# Patient Record
Sex: Female | Born: 1945 | Race: White | Hispanic: No | Smoking: Never smoker
Health system: Southern US, Community
[De-identification: ages and names within clinical notes are randomized; demographics above are authoritative.]

## PROBLEM LIST (undated history)

## (undated) DIAGNOSIS — I639 Cerebral infarction, unspecified: Secondary | ICD-10-CM

## (undated) DIAGNOSIS — M1990 Unspecified osteoarthritis, unspecified site: Secondary | ICD-10-CM

## (undated) DIAGNOSIS — R011 Cardiac murmur, unspecified: Secondary | ICD-10-CM

## (undated) DIAGNOSIS — K6389 Other specified diseases of intestine: Secondary | ICD-10-CM

## (undated) DIAGNOSIS — G459 Transient cerebral ischemic attack, unspecified: Secondary | ICD-10-CM

## (undated) DIAGNOSIS — I10 Essential (primary) hypertension: Secondary | ICD-10-CM

## (undated) DIAGNOSIS — E7800 Pure hypercholesterolemia, unspecified: Secondary | ICD-10-CM

## (undated) DIAGNOSIS — I6529 Occlusion and stenosis of unspecified carotid artery: Secondary | ICD-10-CM

## (undated) DIAGNOSIS — K219 Gastro-esophageal reflux disease without esophagitis: Secondary | ICD-10-CM

## (undated) HISTORY — PX: ABDOMINAL HYSTERECTOMY: SHX81

## (undated) HISTORY — DX: Gastro-esophageal reflux disease without esophagitis: K21.9

## (undated) HISTORY — PX: TUBAL LIGATION: SHX77

## (undated) HISTORY — DX: Pure hypercholesterolemia, unspecified: E78.00

## (undated) HISTORY — PX: KNEE ARTHROSCOPY: SUR90

## (undated) HISTORY — PX: OTHER SURGICAL HISTORY: SHX169

## (undated) HISTORY — DX: Occlusion and stenosis of unspecified carotid artery: I65.29

## (undated) HISTORY — PX: FRACTURE SURGERY: SHX138

## (undated) HISTORY — DX: Essential (primary) hypertension: I10

## (undated) HISTORY — PX: TONSILLECTOMY: SUR1361

## (undated) HISTORY — PX: ROTATOR CUFF REPAIR: SHX139

## (undated) HISTORY — PX: CATARACT EXTRACTION W/ INTRAOCULAR LENS IMPLANT: SHX1309

## (undated) HISTORY — DX: Other specified diseases of intestine: K63.89

---

## 2000-09-24 ENCOUNTER — Other Ambulatory Visit: Admission: RE | Admit: 2000-09-24 | Discharge: 2000-09-24 | Payer: Self-pay | Admitting: *Deleted

## 2001-03-16 ENCOUNTER — Encounter: Payer: Self-pay | Admitting: Family Medicine

## 2001-03-16 ENCOUNTER — Ambulatory Visit (HOSPITAL_COMMUNITY): Admission: RE | Admit: 2001-03-16 | Discharge: 2001-03-16 | Payer: Self-pay | Admitting: Nephrology

## 2001-03-17 ENCOUNTER — Ambulatory Visit (HOSPITAL_COMMUNITY): Admission: RE | Admit: 2001-03-17 | Discharge: 2001-03-17 | Payer: Self-pay | Admitting: General Surgery

## 2001-08-27 ENCOUNTER — Ambulatory Visit (HOSPITAL_COMMUNITY): Admission: RE | Admit: 2001-08-27 | Discharge: 2001-08-27 | Payer: Self-pay | Admitting: *Deleted

## 2001-08-27 ENCOUNTER — Encounter: Payer: Self-pay | Admitting: *Deleted

## 2001-11-16 ENCOUNTER — Ambulatory Visit (HOSPITAL_COMMUNITY): Admission: RE | Admit: 2001-11-16 | Discharge: 2001-11-16 | Payer: Self-pay | Admitting: Internal Medicine

## 2001-11-16 ENCOUNTER — Encounter: Payer: Self-pay | Admitting: Internal Medicine

## 2001-12-17 ENCOUNTER — Ambulatory Visit (HOSPITAL_COMMUNITY): Admission: RE | Admit: 2001-12-17 | Discharge: 2001-12-17 | Payer: Self-pay | Admitting: Internal Medicine

## 2001-12-17 ENCOUNTER — Encounter: Payer: Self-pay | Admitting: Internal Medicine

## 2001-12-23 ENCOUNTER — Encounter: Payer: Self-pay | Admitting: Specialist

## 2001-12-30 ENCOUNTER — Inpatient Hospital Stay (HOSPITAL_COMMUNITY): Admission: EM | Admit: 2001-12-30 | Discharge: 2001-12-31 | Payer: Self-pay | Admitting: Specialist

## 2003-01-04 ENCOUNTER — Observation Stay (HOSPITAL_COMMUNITY): Admission: RE | Admit: 2003-01-04 | Discharge: 2003-01-05 | Payer: Self-pay | Admitting: *Deleted

## 2003-02-23 ENCOUNTER — Ambulatory Visit (HOSPITAL_COMMUNITY): Admission: RE | Admit: 2003-02-23 | Discharge: 2003-02-23 | Payer: Self-pay | Admitting: *Deleted

## 2003-08-01 ENCOUNTER — Inpatient Hospital Stay (HOSPITAL_COMMUNITY): Admission: RE | Admit: 2003-08-01 | Discharge: 2003-08-02 | Payer: Self-pay | Admitting: Obstetrics and Gynecology

## 2003-10-21 ENCOUNTER — Emergency Department (HOSPITAL_COMMUNITY): Admission: EM | Admit: 2003-10-21 | Discharge: 2003-10-21 | Payer: Self-pay | Admitting: Emergency Medicine

## 2004-02-09 ENCOUNTER — Inpatient Hospital Stay (HOSPITAL_COMMUNITY): Admission: RE | Admit: 2004-02-09 | Discharge: 2004-02-10 | Payer: Self-pay | Admitting: Obstetrics and Gynecology

## 2004-06-29 ENCOUNTER — Ambulatory Visit (HOSPITAL_COMMUNITY): Admission: RE | Admit: 2004-06-29 | Discharge: 2004-06-29 | Payer: Self-pay | Admitting: Obstetrics and Gynecology

## 2005-06-12 ENCOUNTER — Ambulatory Visit (HOSPITAL_COMMUNITY): Admission: RE | Admit: 2005-06-12 | Discharge: 2005-06-12 | Payer: Self-pay | Admitting: Internal Medicine

## 2005-07-14 ENCOUNTER — Emergency Department (HOSPITAL_COMMUNITY): Admission: EM | Admit: 2005-07-14 | Discharge: 2005-07-14 | Payer: Self-pay | Admitting: Emergency Medicine

## 2005-07-17 ENCOUNTER — Ambulatory Visit (HOSPITAL_COMMUNITY): Admission: RE | Admit: 2005-07-17 | Discharge: 2005-07-17 | Payer: Self-pay | Admitting: Pulmonary Disease

## 2005-09-08 HISTORY — PX: COLONOSCOPY: SHX174

## 2005-10-07 ENCOUNTER — Ambulatory Visit: Payer: Self-pay | Admitting: Gastroenterology

## 2005-10-08 ENCOUNTER — Ambulatory Visit (HOSPITAL_COMMUNITY): Admission: RE | Admit: 2005-10-08 | Discharge: 2005-10-08 | Payer: Self-pay | Admitting: General Surgery

## 2005-10-21 ENCOUNTER — Ambulatory Visit (HOSPITAL_COMMUNITY): Admission: RE | Admit: 2005-10-21 | Discharge: 2005-10-21 | Payer: Self-pay | Admitting: Internal Medicine

## 2005-10-29 ENCOUNTER — Encounter: Admission: RE | Admit: 2005-10-29 | Discharge: 2005-10-29 | Payer: Self-pay | Admitting: Internal Medicine

## 2006-01-10 ENCOUNTER — Ambulatory Visit: Payer: Self-pay | Admitting: Gastroenterology

## 2006-01-31 ENCOUNTER — Ambulatory Visit (HOSPITAL_COMMUNITY): Admission: RE | Admit: 2006-01-31 | Discharge: 2006-01-31 | Payer: Self-pay | Admitting: Family Medicine

## 2006-02-18 ENCOUNTER — Ambulatory Visit: Payer: Self-pay | Admitting: Gastroenterology

## 2007-03-27 ENCOUNTER — Ambulatory Visit (HOSPITAL_COMMUNITY): Admission: RE | Admit: 2007-03-27 | Discharge: 2007-03-27 | Payer: Self-pay | Admitting: Internal Medicine

## 2007-05-26 ENCOUNTER — Emergency Department (HOSPITAL_COMMUNITY): Admission: EM | Admit: 2007-05-26 | Discharge: 2007-05-26 | Payer: Self-pay | Admitting: Emergency Medicine

## 2008-04-01 ENCOUNTER — Ambulatory Visit (HOSPITAL_COMMUNITY): Admission: RE | Admit: 2008-04-01 | Discharge: 2008-04-01 | Payer: Self-pay | Admitting: Family Medicine

## 2008-05-09 ENCOUNTER — Ambulatory Visit (HOSPITAL_BASED_OUTPATIENT_CLINIC_OR_DEPARTMENT_OTHER): Admission: RE | Admit: 2008-05-09 | Discharge: 2008-05-09 | Payer: Self-pay | Admitting: Orthopedic Surgery

## 2010-01-16 ENCOUNTER — Ambulatory Visit (HOSPITAL_COMMUNITY)
Admission: RE | Admit: 2010-01-16 | Discharge: 2010-01-16 | Payer: Self-pay | Source: Home / Self Care | Admitting: Internal Medicine

## 2010-02-08 HISTORY — PX: ESOPHAGOGASTRODUODENOSCOPY (EGD) WITH ESOPHAGEAL DILATION: SHX5812

## 2010-02-16 DIAGNOSIS — E663 Overweight: Secondary | ICD-10-CM | POA: Insufficient documentation

## 2010-02-16 DIAGNOSIS — R748 Abnormal levels of other serum enzymes: Secondary | ICD-10-CM | POA: Insufficient documentation

## 2010-02-16 DIAGNOSIS — E785 Hyperlipidemia, unspecified: Secondary | ICD-10-CM | POA: Insufficient documentation

## 2010-02-16 DIAGNOSIS — K219 Gastro-esophageal reflux disease without esophagitis: Secondary | ICD-10-CM | POA: Insufficient documentation

## 2010-02-16 DIAGNOSIS — E782 Mixed hyperlipidemia: Secondary | ICD-10-CM | POA: Insufficient documentation

## 2010-02-20 ENCOUNTER — Ambulatory Visit: Payer: Self-pay | Admitting: Gastroenterology

## 2010-02-20 ENCOUNTER — Encounter: Payer: Self-pay | Admitting: Internal Medicine

## 2010-02-20 DIAGNOSIS — R1310 Dysphagia, unspecified: Secondary | ICD-10-CM | POA: Insufficient documentation

## 2010-02-21 ENCOUNTER — Encounter: Payer: Self-pay | Admitting: Gastroenterology

## 2010-02-23 LAB — CONVERTED CEMR LAB
ALT: 32 units/L (ref 0–35)
AST: 19 units/L (ref 0–37)
Albumin: 4.3 g/dL (ref 3.5–5.2)
Alkaline Phosphatase: 90 units/L (ref 39–117)
Bilirubin, Direct: 0.1 mg/dL (ref 0.0–0.3)
Indirect Bilirubin: 0.2 mg/dL (ref 0.0–0.9)
Total Bilirubin: 0.3 mg/dL (ref 0.3–1.2)
Total Protein: 6.9 g/dL (ref 6.0–8.3)

## 2010-03-06 ENCOUNTER — Ambulatory Visit (HOSPITAL_COMMUNITY)
Admission: RE | Admit: 2010-03-06 | Discharge: 2010-03-06 | Payer: Self-pay | Source: Home / Self Care | Attending: Internal Medicine | Admitting: Internal Medicine

## 2010-03-15 ENCOUNTER — Encounter: Payer: Self-pay | Admitting: Internal Medicine

## 2010-04-02 NOTE — Op Note (Addendum)
  NAME:  [name], [name]                   ACCOUNT NO.:  [id number]  MEDICAL RECORD NO.:  [id number]          PATIENT TYPE:  AMB  LOCATION:  DAY                           FACILITY:  [facility]  PHYSICIAN:  R. Roetta Sessions, M.D. DATE OF BIRTH:  [birth year]  DATE OF PROCEDURE:  03/06/2010 DATE OF DISCHARGE:                              OPERATIVE REPORT   PROCEDURE:  EGD with Elease Hashimoto dilation.  INDICATIONS FOR PROCEDURE:  A 65 year old lady with worsening reflux symptoms noncompliant on acid suppression regimen now with intermittent esophageal dysphagia.  She had reflux for at least 10 years.  EGD is now being done.  Potential for esophageal dilation reviewed.  Reviewed the risks, benefits, limitations, alternatives, and imponderables, all parties questions answered, all parties agreeable.  Please see the documentation of medical record.  PROCEDURE NOTE:  O2 saturation, blood pressure, pulse, respirations were monitored throughout the entire procedure.  CONSCIOUS SEDATION:  Versed 6 mg IV, Demerol 100 mg IV in divided doses.  INSTRUMENT:  Pentax video chip system.  Cetacaine spray for topical pharyngeal anesthesia.  FINDINGS:  Examination of tubular esophagus revealed noncritical Schatzki's ring, otherwise esophageal mucosa appeared entirely normal. EG junction easily traversed.  Stomach:  Gastric cavity was emptied and insufflated well with air. Thorough examination of gastric mucosa including retroflexion of proximal stomach esophagogastric junction demonstrated small hiatal hernia.  The ring was seen better retroflexed en face gastric mucosa otherwise appeared normal.  Pylorus was patent, easily traversed. Examination of bulb and second portion revealed no abnormalities.  THERAPEUTIC/DIAGNOSTIC MANEUVERS PERFORMED:  Scope was withdrawn.  A 56- French Maloney dilator was passed to full insertion with ease.  A look back revealed no apparent complication related to passage of the dilator.   The patient tolerated the procedure well, was reactive to endoscopy.  IMPRESSION: 1. Noncritical Schatzki's ring otherwise unremarkable esophagus status     post dilation described above. 2. Small hiatal hernia, otherwise normal stomach, D1 and D2.  RECOMMENDATIONS: 1. Continue Nexium 40 mg orally daily.  Emphasize the importance of     taking this medication daily. 2. Literature on reflux and hiatal hernia provided to [name]. 3. Follow up with [place] in the office in 6 months.     Jonathon Bellows, M.D.     RMR/MEDQ  D:  03/06/2010  T:  03/06/2010  Job:  [id number]  cc:   [facility]  Electronically Signed by Lorrin Goodell M.D. on 04/02/2010 09:11:47 AM

## 2010-04-12 NOTE — Letter (Signed)
Summary: EGD ORDER  EGD ORDER   Imported By: Ave Filter 02/20/2010 15:01:57  _____________________________________________________________________  External Attachment:    Type:   Image     Comment:   External Document

## 2010-04-12 NOTE — Assessment & Plan Note (Addendum)
Summary: gerd/ss   Visit Type:  Initial Consult Referring Provider:  Dr. Ouida Sills Primary Care Provider:  Dr. Ouida Sills  CC:  gerd- burning and pain.  History of Present Illness: Ms. [name] presents today at the request of Dr. Ouida Sills secondary to GERD. She was last seen by our office in December 2007, secondary to mildly elevated liver enyzmes. Biopsy revealed fatty liver. She reports  hx of GERD with use of Prevacid and Nexium in past. Nexium provided best relief. Reports today a worsening of GERD X 1 year. Unable to afford nexium, using Prevacid and Tums daily. c/o dysphagia, +choking, not necessarily with just tough textures. Denies nausea. No prior EGD.   Current Medications (verified): 1)  Nexium 40 Mg Cpdr (Esomeprazole Magnesium) .... Once Daily 2)  Welchol 625 Mg Tabs (Colesevelam Hcl) .... Three Times A Day 3)  Coq10 100 Mg Caps (Coenzyme Q10) .... 110mg  Once Daily  Allergies (verified): No Known Drug Allergies  Past History:  Past Medical History: GERD hypercholesterolemia  Past Surgical History: Tubal ligation bladder tack X 2 bilateral knee arthoscopy (torn meniscus) rotator cuff repair hysterectomy  Family History: Mother:deceased, age 56, CHF Father:deceased, age 105, MI, throat ca sister: diabetes No FH of Colon Cancer:  Social History: occupation: retired Patient has never smoked.  Alcohol Use - no Illicit Drug Use - no Smoking Status:  never Drug Use:  no  Review of Systems General:  Denies fever, chills, and anorexia. Eyes:  Denies blurring, irritation, and discharge. ENT:  Complains of difficulty swallowing; denies earache, sore throat, and hoarseness. CV:  Denies chest pains and syncope. Resp:  Denies dyspnea at rest and wheezing. GI:  Complains of difficulty swallowing and indigestion/heartburn; denies pain on swallowing, nausea, abdominal pain, jaundice, constipation, change in bowel habits, bloody BM's, and black BMs. GU:  Denies urinary burning and  urinary frequency. MS:  Denies joint pain / LOM, joint swelling, and joint stiffness. Derm:  Denies rash, itching, and dry skin. Neuro:  Denies weakness and syncope. Psych:  Denies depression and anxiety.  Vital Signs:  Patient profile:   65 year old female Height:      62 inches Weight:      183 pounds BMI:     33.59 Temp:     98.2 degrees F oral Pulse rate:   60 / minute BP sitting:   138 / 88  (left arm) Cuff size:   regular  Vitals Entered By: Hendricks Limes LPN (February 20, 2010 1:57 PM)  Physical Exam  General:  Well developed, well nourished, no acute distress. Head:  Normocephalic and atraumatic. Eyes:  sclera without icterus. Lungs:  Clear throughout to auscultation. Heart:  Regular rate and rhythm; no murmurs, rubs,  or bruits. Abdomen:  normal bowel sounds, without guarding, without rebound, no distesion, no masses, and no hepatomegally or splenomegaly.   Msk:  Symmetrical with no gross deformities. Normal posture. Pulses:  Normal pulses noted. Extremities:  No clubbing, cyanosis, edema or deformities noted. Neurologic:  Alert and  oriented x4;  grossly normal neurologically.  Impression & Recommendations:  Problem # 1:  GASTROESOPHAGEAL REFLUX DISEASE (ICD-59.78) 65 year old female with hx of chronic GERD, relief with Nexium in past. Unable to afford, started to take Prevacid. c/o worsening of GERD despite prevacid/tums X 1 year, as well as dysphagia. denies odynophagia. denies n/v.    Begin Dexilant EGD/ED with Dr. Jena Gauss in near future: risks, benefits, alternatives discussed with pt; she stated understanding  Orders: T-Hepatic Function [id number]) Consultation Level III (  [address])  Problem # 2:  DYSPHAGIA UNSPECIFIED (ICD-787.20) see # 1. Orders: T-Hepatic Function [id number]) Consultation Level III [phone])  Problem # 3:  OTHER NONSPECIFIC ABNORMAL SERUM ENZYME LEVELS (ICD-790.5)  hx of NAFLD. no recent LFTs. will recheck today. encouraged  diet/exercise, weight loss of 1-2 lbs per week.  Orders: Consultation Level III [phone])

## 2010-04-12 NOTE — Letter (Signed)
Summary: lab  lab   Imported By: Westlynn Eves 03/15/2010 09:16:15  _____________________________________________________________________  External Attachment:    Type:   Image     Comment:   External Document

## 2010-06-21 LAB — POCT HEMOGLOBIN-HEMACUE: Hemoglobin: 14.8 g/dL (ref 12.0–15.0)

## 2010-07-24 NOTE — Op Note (Signed)
[name], [name]                   ACCOUNT NO.:  [id number]   MEDICAL RECORD NO.:  [id number]          PATIENT TYPE:  AMB   LOCATION:  [facility]                         FACILITY:  [facility]   PHYSICIAN:  Marlowe Kays, M.D.  DATE OF BIRTH:  10/29/1945   DATE OF PROCEDURE:  05/09/2008  DATE OF DISCHARGE:                               OPERATIVE REPORT   PREOPERATIVE DIAGNOSIS:  Torn medial meniscus left knee.   POSTOPERATIVE DIAGNOSES:  1. Torn medial meniscus left knee.  2. Osteoarthritis left knee.   OPERATION:  Left knee arthroscopy with  1. Partial medial meniscectomy.  2. Shaving of medial femoral condyle and patella.   SURGEON:  Marlowe Kays, M.D.   ASSISTANT:  Nurse.   ANESTHESIA:  General.   PATHOLOGY AND JUSTIFICATION FOR PROCEDURE:  She has a history of right  knee arthroscopy several years ago which was done well.  She has had  pain in her left knee and MRIs demonstrated the torn medial meniscus.  Accordingly, she is here for above-mentioned surgery.  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   PROCEDURE:  Satisfied general anesthesia, Ace wrap to right lower,  extremity pneumatic tourniquet to the left lower extremity with leg  Esmarched out non sterilely and thigh stabilizer applied and leg was  prepped with DuraPrep from stabilizer to ankle and draped in sterile  field.  Time-out performed.  Superior medial saline inflow, first  through an anteromedial portal lateral compartment of knee joint was  evaluated.  This unremarkable.  I then looked up to the lateral gutter  and suprapatellar area.  She had some wear of her mid patella which I  pictured and shaved down until smooth with a 3.5 shaver.  On reversing  portals, she had grade 2-3/4 chondromalacia medial femoral condyle which  I pictured and shaved down until smooth.  She had fairly substantial  tear mainly at the posterior curve of the medial meniscus which I  resected back to stable rim contouring the meniscus with baskets and  then shaved down until smooth with 3.5 shaver.  I then irrigated the  knee joint until clear and all fluid possible was removed.  I closed the  two entry portals with 4-0 nylon and injected through the inflow  apparatus 20 mL 0.5%  Marcaine with adrenaline and 4 mg of morphine.  I then removed the  inflow apparatus and closed this portal with 4-0 nylon as well.  Betadine, Adaptic and dry sterile dressing were applied.  Tourniquet was  released.  She tolerated the procedure well, was taken to recovery room  in satisfactory condition with no known complications.           ______________________________  Marlowe Kays, M.D.     JA/MEDQ  D:  05/09/2008  T:  05/09/2008  Job:  [id number]

## 2010-07-27 NOTE — H&P (Signed)
NAME:  [name], [name]                             ACCOUNT NO.:  [id number]   MEDICAL RECORD NO.:  [id number]                   PATIENT TYPE:  AMB   LOCATION:  DAY                                  FACILITY:  [facility]   PHYSICIAN:  Tilda Burrow, M.D.              DATE OF BIRTH:  1946-03-05   DATE OF ADMISSION:  08/01/2003  DATE OF DISCHARGE:                                HISTORY & PHYSICAL   ADMITTING DIAGNOSIS:  Second-degree uterine descensus, pelvic relaxation.   HISTORY OF PRESENT ILLNESS:  This 65 year old female, postmenopausal times  years, is admitted at this time for vaginal hysterectomy with removal of  tubes and ovaries.  Jonette has been referred to our office at the courtesy of  Dr. Dalia Heading, who had seen her for vaginal protrusion.  Efforts last  year at anterior and posterior repair by Dr. Langley Gauss improved the  anterior and posterior vaginal wall support but she still has a protrusion  at the introitus.  This is particularly noticeable when she is examined in  the standing position.  Mirror exam with Valsalva shows that she reaches  second-degree uterine descensus and the cervix actually protrudes through  the introitus slightly.  She has had satisfactory front and back wall  support from Dr. Preston Fleeting surgery but the uterine prolapse is only  distinctive when the patient is standing.  She declines consideration of  pessary and wishes surgical correction.  Pros and cons of ovarian  preservation have been discussed and being postmenopausal, recommendations  are for removal of ovaries.  Specific discussion of what to do in the case  the ovaries are difficult to access has been conducted and the patient  wishes to have ovaries removed, even if abdominal approach is necessary; 1%  to 2% complication rate is quoted to the patient.  Specific mention of  potential for injury to adjacent organs including bladder, ureters or bowel  is conducted; she acknowledges  understanding of these issues.   PAST MEDICAL HISTORY:  Benign.   SURGICAL HISTORY:  1. Knee surgery, 2003.  2. Torn rotator cuff, left shoulder, 2003.  3. Anterior and posterior repair in 2004.   ALLERGIES:  None.   MEDICATIONS:  Prempro 1 p.o. daily with LMP, June 1999, no postmenopausal  bleeding encountered.   PHYSICAL EXAM:  VITAL SIGNS:  Height 5 feet 1, weight 167.  Blood pressure  148/82, pulse 70s.  HEENT:  In general, pupils are round and reactive, extraocular movements  intact.  NECK:  Neck supple.  Trachea midline.  CHEST:  Chest clear to auscultation.  ABDOMEN:  Abdomen nontender without masses.  PELVIC:  External genitalia:  Well-healed posterior repair and anterior  repair performed.  Cervix protruded through introitus slightly with vertical  exam or Valsalva.  Adnexa negative for masses.  RECTAL:  Hemoccult negative per Dr. Lovell Sheehan.   IMPRESSION:  Second-degree uterine descensus.  PLAN:  Vaginal hysterectomy with removal of tubes and ovaries on Aug 01, 2003.     ___________________________________________                                         Tilda Burrow, M.D.   JVF/MEDQ  D:  07/29/2003  T:  07/29/2003  Job:  [id number]   cc:   Dalia Heading, M.D.  [address] [place]  [place] [address]  Fax: [phone]

## 2010-07-27 NOTE — H&P (Signed)
NAME:  [name], [name]                   ACCOUNT NO.:  [id number]   MEDICAL RECORD NO.:  [id number]          PATIENT TYPE:  AMB   LOCATION:  DAY                           FACILITY:  [facility]   PHYSICIAN:  Tilda Burrow, M.D. DATE OF BIRTH:  May 07, 1945   DATE OF ADMISSION:  DATE OF DISCHARGE:  LH                                HISTORY & PHYSICAL   ADMITTING DIAGNOSIS:  Cystocele.   HISTORY OF PRESENT ILLNESS:  This 65 year old female was admitted for  cystocele repair.  She has had a lengthy GYN history over the last year. She  had an anterior and posterior repair performed last year by Dr. Roylene Reason.  Lisette Grinder with protrusion of the introitus found to be noticeable.  This was  examined and our treatment was a vaginal hysterectomy performed earlier this  year.  Bilateral salpingo-oophorectomy was performed at the same time.  At  the time we were impressed that the anterior support was adequate.  Unfortunately with the improved vaginal vault support she has noticed  progression of a sense of heaviness since the surgery that, on examination,  shows that she is developing a cystocele.  We discussed treatment options  including pessary, etcetera.  She does not wish to delay treatment and  wishes to have the cystocele repaired. She has a sensation of incomplete  voiding.  She dribbles a few drops after standing. She has had urodynamics  testing by Dr. Despina Hidden which shows that she does loose a couple of drops at the  end of voiding with extensive volume in her bladder with over 665 cc bladder  volume noted.  There is no detrusor instability and no visible stress  incontinence.  Therefore, we are going to proceed with cystocele repair  leaving the urethra alone.  She has been made aware that her physical pelvic  support, obviously is prone towards progression; and that the same forces  that have caused the relaxation to this point will be active in the future;  she wishes to proceed, at this time, due  to the discomfort and double  voiding sensation.  The risks of procedure are answered to the patient's  satisfaction.   PAST MEDICAL HISTORY:  Benign.   PAST SURGICAL HISTORY:  1.  Knee surgery in 2003.  2.  Rotator cuff in 2003.  3.  Anterior and posterior repair in 2004.  4.  Vaginal hysterectomy and bilateral salpingo-oophorectomy in 2005.   MEDICATIONS:  Premarin p.o. daily.   PHYSICAL EXAMINATION:  GENERAL:  Exam shows a healthy appearing Caucasian  female, alert and oriented x3.  HEENT:  Pupils are equal, round, and reactive.  Extraocular movements  intact.  NECK:  Supple.  CHEST:  Clear to auscultation.  ABDOMEN:  Nontender.  PELVIC:  External genitalia normal.  Vaginal exam shows protrusion of the  cystocele, sufficiently for patient  discomfort.  The introitus is adequately supported anteriorly and  posteriorly.  She denies rectal complaints.  EXTREMITIES:  Grossly normal.   PLAN:  Anterior cystocele repair on 02/09/2004.     John   JVF/MEDQ  D:  02/08/2004  T:  02/08/2004  Job:  [id number]

## 2010-07-27 NOTE — Op Note (Signed)
[name], [name]                               ACCOUNT NO.:  [id number]   MEDICAL RECORD NO.:  [id number]                  PATIENT TYPE:   LOCATION:                                       FACILITY:   PHYSICIAN:  Langley Gauss, M.D.                DATE OF BIRTH:   DATE OF PROCEDURE:  01/04/2003  DATE OF DISCHARGE:                                 OPERATIVE REPORT   PREOPERATIVE DIAGNOSES:  1. Cystocele.  2. Rectocele.   POSTOPERATIVE DIAGNOSES:  1. Cystocele.  2. Rectocele.   PROCEDURE:  1. Anterior repair with anterior colporrhaphy.  2. Posterior repair of rectocele.   SURGEON:  Roylene Reason. Lisette Grinder, M.D.   ESTIMATED BLOOD LOSS:  50 cc.   SPECIMENS:  Portions of redundant vaginal mucosa for permanent section only.   ANESTHESIA:  General endotracheal.   DRAINS:  A Foley catheter placed following the procedure with findings of  200 cc of a clear yellow urine.  A vaginal packing saturated with a MetroGel  solution is placed intravaginally.   FINDINGS AT TIME OF SURGERY:  Large rectocele without enterocele identified.  Noted to be a small cystocele.  Pertinently with the patient in the  lithotomy position with the candy cane stirrups and paralysis, with  induction of anesthesia, the patient was noted to have several Valsalva-type  expulsive maneuvers which at that time did reveal significant uterine  descensus with the uterosacral ligaments descended to the hymenal ring.  However, the patient had stated, previously, that she did not wish to pursue  evaluation for a hysterectomy.   OPERATIVE FINDINGS:  The patient was taken to the operating room and vital  signs were stable.  The patient underwent uncomplicated induction of general  anesthesia.  She was then placed in the candy cane stirrups.  Examination,  at that time revealed findings as noted previously.  No evidence of any  enterocele.  The patient is then sterilely prepped and draped.  A red rubber  catheter was used  to drain about 50 cc of clear yellow urine from the  bladder.   The posterior repair was performed initially.  The posterior vestibule was  identified.  The skin at the vestibule itself is grasped at 3 and 9 o'clock.  These are gently retracted sideways which then allows sharp transection with  a knife.  This then allowed me to dissect in the avascular plane posteriorly  beneath the vaginal mucosa and the rectal and cervical fascia.  This  dissection in the posterior vaginal mucosa is continued in the midline in  the avascular plane to the level of the cervix.  Dissection is then  performed in the avascular plane continuing laterally on each side to  atraumatically dissect the cervical rectal fascia off the overlying vaginal  mucosa.  Horizontal mattress sutures of 2-0 Vicryl interrupted sutures are  then placed and tagged; and then  tied sequentially.  This allows oversewing  of redundant fascial layer in the midline and reduction of the fascial layer  of the rectocele.  Redundant vaginal mucosa is then excised and the vaginal  mucosa overlying the rectum is closed with a #0 chromic running locked  suture to restore the normal anatomy.  This is done atraumatically and  minimal bleeding is noted to occur and normal cosmetic result is achieved.  I then perceived with the anterior repair.   A weighted speculum is placed.  The cystocele is grasped as high up as I  could anteriorly at 3 and 9 o'clock with Allis clamps.  A sharp knife is  then used to transect between the two.  This then allows me to dissect in  the avascular plane between the vaginal mucosa and the underlying  pubocervical fascia.  Transection was then performed in the midline of this  vaginal mucosa.  This then allows me to dissect laterally in the avascular  plane and mobilize the overlying vaginal mucosa.  Again, in horizontal  mattress sutures of 2-0 Vicryl have been placed to reduce the redundant  fascial layer in the  midline and restore a normal anatomy.  Redundant  vaginal mucosa is then excised. The vaginal mucosa is then again closed with  a continuous running #0 chromic running lock suture.  With improvement in  the anterior and posterior support, the cervix is noted to now remain  elevated within the vagina and seems to have improved support.   The procedure was then terminated.  A single packing is then saturated with  the MetroGel solution and this is placed within the vaginal mucosa.  The  procedure is then terminated.  The patient is reversed of anesthesia, taken  to the recovery room in stable condition.  Operative findings discussed with  the patient's awaiting family.  After placement of the packing Foley  catheter was easily placed to straight drainage with findings of 200 cc of  clear yellow urine.      ___________________________________________                                            Langley Gauss, M.D.   DC/MEDQ  D:  01/04/2003  T:  01/04/2003  Job:  [id number]

## 2010-07-27 NOTE — Op Note (Signed)
NAME:  [name], [name]                             ACCOUNT NO.:  [id number]   MEDICAL RECORD NO.:  [id number]                   PATIENT TYPE:  INP   LOCATION:  A404                                 FACILITY:  [facility]   PHYSICIAN:  Tilda Burrow, M.D.              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   PROCEDURE:  Vaginal hysterectomy, bilateral salpingo-oophorectomy.   SURGEON:  Tilda Burrow, M.D.   ASSISTANTCurley Spice, CST-FA, Gaspar Garbe.   ANESTHESIA:  General.   COMPLICATIONS:  None.   ESTIMATED BLOOD LOSS:  100 mL.   FINDINGS:  Second degree uterine descensus with cervix descending to  introitus with __________ Valsalva maneuver.   DESCRIPTION OF PROCEDURE:  The patient was placed in operating room in the  supine position with legs in low level leg supports, yellow fin brand, with  Flowtron's in position. The legs were positioned in the high lithotomy  position. The peritoneum was prepped and draped. A speculum was inserted and  Valsalva maneuver showed that the cervix reached the introitus. A double  tooth Lahey thyroid tenaculum was used to grasp the cervix and posterior  colpotomy performed without difficulty. A curved Zeppelin clamp was used to  cross clamp the uterosacral ligament on the left, transect it and suture  ligate it with #0 chromic tagging for future identification. The opposite  ligament was treated similarly.  An incision was then made through the  anterior cervical vaginal fornix opening the vesicovaginal plane between the  bladder and uterus, and entering the peritoneum anteriorly. We then went to  the lower cardinal ligaments which were then clamped, cut and suture ligated  using Zeppelin  clamps, Mayo scissors and #0 chromic.  We then proceeded with  marching up the broad ligament on each side clamping, cutting and suture  ligating with ease marching up to the uterine apex. A moistened vaginal tape  was placed above this and held in position with anterior Deaver retractor.  We then proceeded to inspect the adnexa.  Hemostasis was satisfactory. The  ovaries were visible and accessible. We then used a Babcock clamp to grasp  the left tube and ovary, pull it downward sufficiently to cross clamp  beneath the tube and ovary, identifying the site of prior tubal  sterilization, and we were able to take out the tube and ovary. The pedicle  was tied with a single suture of 2-0 chromic with good hemostasis achieved.  The stump was released.  The right infundibulopelvic ligament was similarly  cross clamped with the Zeppelin clamp after the tube and ovary pulled toward  the pelvis. Transection of the tube and ovary  followed by a single loop  ligature of the right infundibulopelvic ligament completed the dissection.  We then closed the cuff by pulling peritoneum from the bladder down to the  pouch of Douglas and attaching it with continuous running suture.  We then  closed the cuff in the midline pulling the uterosacral ligaments together in  the midline and then closing the cuff in the midline with good pelvic cuff  support and surgical results. The patient tolerated the procedure well and  went to the recovery room in good condition with Foley catheter inserted  revealing generous clear urine and no vaginal packing necessary.   ADDENDUM:  It should be mentioned that at the start of the procedure the  introitus was quite small and restricted and in order to place the speculum,  we made a small releasing incision at the site of the old episiotomy and  posterior repair releasing approximately 2 cm of the posterior peritoneal  body.  At the end of the procedure, this was reapproximated  using  subcutaneous interrupted 2-0 plain sutures.  The patient tolerated the  procedure well and went to the recovery room in good condition.      ___________________________________________                                            Tilda Burrow, M.D.   JVF/MEDQ  D:  08/01/2003  T:  08/03/2003  Job:  [id number]   cc:   Dalia Heading, M.D.  [address] [place]  [place] [address]  Fax: [phone]

## 2010-07-27 NOTE — H&P (Signed)
[name], [name]                               ACCOUNT NO.:  [id number]   MEDICAL RECORD NO.:  [id number]                  PATIENT TYPE:   LOCATION:                                       FACILITY:  [facility]   PHYSICIAN:  Langley Gauss, M.D.                DATE OF BIRTH:   DATE OF ADMISSION:  01/04/2003  DATE OF DISCHARGE:                                HISTORY & PHYSICAL   HISTORY OF PRESENT ILLNESS:  This is a 65 year old gravida 3, para 3, with  three prior vaginal deliveries, postmenopausal, who is planning on having  outpatient surgery of anterior and posterior repairs for chief complaint of  symptomatic cystocele and rectocele.  She, however, has good uterine support  and desires that no attention be given to the uterus.   The patient originally was seen for evaluation on November 17, 2002, at  which time she complained of a growth on her vaginal area about the size of  a quarter, also vaginal pressure, like patient has a tampon in.  This was  noted to be more noticeable during exercise and restroom trips.  By history  and by review of patient's previous evaluations revealed that the symptoms  have been present for less than six months' duration.   Pertinently, the patient states that she can feel a bulge, especially after  working a long shift.  She experiences significant pelvic pressure, however,  pertinently, she does not have any genuine stress urinary incontinence.  The  rectocele is noted to be large and bulging, but she is not experiencing any  constipation as a result of this.   The patient is noted to be postmenopausal.  She has had no postmenopausal  bleeding.  She underwent menopause at about age 43.  She has been well-  maintained on Prempro 0.45/1.5 mg daily; this is for symptomatic relief of  incapacitating hot flashes.   PAST MEDICAL HISTORY:  The patient is noted to have elevated cholesterol;  she has been taking Zocor.  Likewise, she has mild hypertension  for which  she takes hydrochlorothiazide 25 mg p.o. daily.  She has had three prior  vaginal deliveries.  She has had right knee surgery, April 2003.  She has  had left rotator cuff surgery in October 2003.   REVIEW OF SYSTEMS:  Review of systems is pertinent for no history of heart  disease other than mild hypertension.  Most pertinently, the patient has no  complaints of postmenopausal bleeding.  She denies any bloody stools,  chronic constipation or diarrhea.   PHYSICAL EXAMINATION:  GENERAL:  Pleasant white female.  VITAL SIGNS:  Pulse 71, 144/82.  Weight is 163 pounds.  Height is 5 feet 1  inch.  HEENT:  Negative.  NECK:  No adenopathy.  Neck is supple.  Thyroid is nonpalpable.  LUNGS:  Lungs are clear.  CARDIOVASCULAR:  Exam is  regular rate and rhythm.  ABDOMEN:  The abdomen is soft and nontender.  No surgical scars are  identified.  PELVIC:  Normal external genitalia.  Cervix noted to be without lesions.  The cervix is high up within the vaginal vault.  The uterus is well-  supported.  Examination of pelvic support reveals a large rectocele present  which is visible at the introitus.  There was also noted to be a moderate-  sized cystocele with no urethral detachment.   ASSESSMENT:  Symptomatic cystocele and rectocele.  The patient appears to  have good uterine support.  In addition, she declines evaluation of the  uterine descensus; specifically, she denies placement of a clamp for gentle  traction.  Thus, I will proceed only with anterior and posterior repair.  The risks and benefits of the surgical procedure are discussed with the  patient to include risks of bladder injury, infection or hemorrhage.     ___________________________________________                                         Langley Gauss, M.D.   DC/MEDQ  D:  01/04/2003  T:  01/04/2003  Job:  [id number]

## 2010-07-27 NOTE — H&P (Signed)
NAME:  [name], [name]                   ACCOUNT NO.:  [id number]   MEDICAL RECORD NO.:  [id number]          PATIENT TYPE:  AMB   LOCATION:                                FACILITY:  [facility]   PHYSICIAN:  Dalia Heading, M.D.  DATE OF BIRTH:  10/09/1945   DATE OF ADMISSION:  DATE OF DISCHARGE:  LH                                HISTORY & PHYSICAL   CHIEF COMPLAINT:  Need for screening colonoscopy.   HISTORY OF PRESENT ILLNESS:  Patient is a 65 year old white female who is  referred for endoscopic evaluation.  She needs a colonoscopy for recent  hematochezia.  She has noted blood and mucus per rectum recently.  No  abdominal pain, weight loss, nausea, vomiting, diarrhea, constipation, or  melena have been noted.  There is no family history of colon carcinoma.  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   PAST MEDICAL HISTORY:  Includes hypertension and reflux disease.   PAST SURGICAL HISTORY:  Hysterectomy.   CURRENT MEDICATIONS:  Nexium and hydrochlorothiazide.   ALLERGIES:  No known drug allergies.   REVIEW OF SYSTEMS:  Noncontributory.   PHYSICAL EXAMINATION:  GENERAL:  On physical examination, the patient is a  well-developed, well-nourished white female in no acute distress.  LUNGS:  Clear to auscultation with equal breath sounds bilaterally.  HEART EXAMINATION:  Reveals A regular rate and rhythm without S3-S4 or  murmurs.  ABDOMEN:  Soft, nontender, nondistended.  No hepatosplenomegaly or masses  are noted.  RECTAL EXAMINATION:  Deferred to the procedure.   IMPRESSION:  Hematochezia.   PLAN:  The patient is scheduled for colonoscopy on 07/26 2007.  The risks  and benefits of the procedure including bleeding and perforation were fully  explained to the patient, who gave informed consent.      Dalia Heading, M.D.  Electronically Signed     MAJ/MEDQ  D:  08/01/2005  T:  08/01/2005  Job:  [id number]   cc:   [facility]  Day [facility]  Fax: [phone]   Madelin Rear. Sherwood Gambler, MD  Fax: [phone]

## 2010-07-27 NOTE — Procedures (Signed)
[name], [name]                   ACCOUNT NO.:  [id number]   MEDICAL RECORD NO.:  [id number]          PATIENT TYPE:  OUT   LOCATION:                                FACILITY:  [facility]   PHYSICIAN:  Edward L. Juanetta Gosling, M.D.DATE OF BIRTH:  1945/09/18   DATE OF PROCEDURE:  07/22/2005  DATE OF DISCHARGE:                              PULMONARY FUNCTION TEST   1.  Spirometry is normal.  2.  Lung volumes are normal.  3.  DLCO is normal.  4.  Arterial blood gases are normal.      Edward L. Juanetta Gosling, M.D.  Electronically Signed     ELH/MEDQ  D:  07/22/2005  T:  07/22/2005  Job:  [id number]

## 2010-07-27 NOTE — Op Note (Signed)
[name], [name]                   ACCOUNT NO.:  [id number]   MEDICAL RECORD NO.:  [id number]          PATIENT TYPE:  AMB   LOCATION:  DAY                           FACILITY:  [facility]   PHYSICIAN:  Tilda Burrow, M.D. DATE OF BIRTH:  [birth year]   DATE OF PROCEDURE:  02/09/2004  DATE OF DISCHARGE:                                 OPERATIVE REPORT   PREOPERATIVE DIAGNOSIS:  Cystocele.   POSTOPERATIVE DIAGNOSIS:  Cystocele.   PROCEDURE:  Anterior repair, revision of posterior repair.   SURGEON:  Tilda Burrow, M.D.   ASSISTANTSAlford Highland and Marlana Salvage.   ANESTHESIA:  General with LMA.   COMPLICATIONS:  None.   ESTIMATED BLOOD LOSS:  Less than 100 mL.   FINDINGS:  Large cystocele.  Adequate vaginal length and apex support.  Thin  perineal body, with cracking developing with minimal contact, necessarily  taken down to improve access to the anterior vaginal wall for anterior  repair plus necessarily rebuilt to thicken perineal support.   DETAILS OF PROCEDURE:  The patient was taken to the operating room, prepped  and draped for vaginal procedure with general anesthesia in place using LMA  laryngeal mask anesthesia.  Legs were supported in high lithotomy support.  Perineum was prepped and draped.  The large cystocele protruded almost to  the introitus beneath a very thin perineal body that restricted access but  did not provide optimal support.  Inferior traction resulted in skin  cracking, so a decision was made to open the posterior perineal repair in  order to improve access for optimal surgical visibility.  This was opened to  a depth of approximately 2-3 cm and up the vagina a distance of  approximately 3 cm.  We then proceeded with anterior repair.  Anterior  repair consisted of infiltrating the vaginal epithelium with Xylocaine with  epinephrine, then dissecting the large cystocele from the overlying vaginal  mucosa.  At times the mucosa was immediately thin and immediately  adjacent  to the bladder, but at no time was there any perforation into the bladder.  A large 2 x 3 cm ellipse of skin was removed and then the anterior repair  could be completed by placing subcutaneous interrupted 0 Dexon horizontal  mattress sutures side to side in order to rebuild the anterior wall support.  This resulted in good obliteration of the large bulging cystocele.  The  redundant vaginal mucosa along the edges was trimmed to allow interrupted 2-  0 plain closure of the anterior vaginal wall with good support and  approximation.   Posterior repair was then performed.  The opening performed earlier was  undermined on either side to gain access to the lateral paravaginal support  tissues, including bulbocavernosus muscles.  A series of interrupted 0 Dexon  sutures was used to pull the perineal body together, thickening the perineal  body, reducing the introitus size slightly, and resulting in a much thicker  perineal body support.  The skin orifice itself was closed with a slightly  zigzag Z-plasty technique to reduce the tendency toward stricture that  had  been noted from the prior repair.  This was performed in Z-shaped incision  line, which resulted in good tissue edge approximation in the perineum.  A  Foley catheter was inserted, obtaining clear urine in generous amounts, and  vaginal packing with Betadine-soaked gauze was lightly performed.  The  patient will be kept overnight for observation and discharged on Friday  morning.     John   JVF/MEDQ  D:  02/09/2004  T:  02/09/2004  Job:  [id number]

## 2010-07-27 NOTE — Op Note (Signed)
NAME:  [name], [name]                             ACCOUNT NO.:  [id number]   MEDICAL RECORD NO.:  [id number]                   PATIENT TYPE:  AMB   LOCATION:  DAY                                  [facility]ACILITY[facility]  [facility]   PHYSICIAN:  Ronnell Guadalajara, MD                  DATE OF BIRTH:  [birth year]   DATE OF PROCEDURE:  12/29/2001  DATE OF DISCHARGE:                                 OPERATIVE REPORT   SURGEON:  Ronnell Guadalajara, MD   ASSISTANT:  Terie Purser L. Idolina Primer, P.A.-C.   PREOPERATIVE DIAGNOSIS:  Rotator cuff tear with acromioclavicular arthritis,  left shoulder.   POSTOPERATIVE DIAGNOSIS:  Rotator cuff tear with acromioclavicular  arthritis, left shoulder.   OPERATIVE PROCEDURE:  1. Anterior acromioplasty.  2. Distal clavicle excision.  3. Rotator cuff repair augmented with a graft jacket.   DESCRIPTION OF PROCEDURE:  After suitable general anesthesia, she is  positioned in the shoulder frame and prepped and draped routinely.  Incision  is made over the distal clavicle and the anterior acromion in line with the  clavicle and down over the deltoid, which was split for about an inch, and  the deltoid was taken off the anterior acromion along the coracoacromial  ligament and the anterior clavicle, and the posterior clavicle was freed up,  all of this with Bovie dissection.  The distal clavicle is excised with a  saw and oblique osteotomy to the anterior acromion with a large Cobb  underneath to protect it.  Severe rotator cuff tear, complex, with a  significant stump being left.  Two drill holes were placed proximal to the  stump, and a #1 Ethibond placed through those two drill holes, mattress-type  suture, coming out distally so we could use that to gather the main part of  the cuff, and the rest of it was repaired with interrupted #1 PDS's, with  the complex nature of a lot of the cuff being trimmed to make a nice fit.  Over all, a graft jacket was placed over the whole of the repair,  sutured in  place with 2-0 PDS.  The biceps tendon was seen within the area, had some  fraying but basically intact.  Three drill holes in the acromion for  placement of #1 PDS in the closure.  It was closed a couple of PDS's  overlying the distal clavicle and the [facility] joint area, a piece of Gelfoam in  the [facility] joint.  Prior to starting the closure, the coracoacromial ligament  was released on the coracoid side so that it could be pulled up with the  deltoid for stronger repair.  Some 2-0 coated Vicryl in the subcu and a  running Monocryl with Steri-Strips on the skin.  Compression dressing, an  abduction pillow splint applied.  Goes to recovery in good condition.  Ronnell Guadalajara, MD    PC/MEDQ  D:  12/29/2001  T:  12/29/2001  Job:  [id number]

## 2010-07-27 NOTE — Discharge Summary (Signed)
NAME:  [name], [name]                             ACCOUNT NO.:  [id number]   MEDICAL RECORD NO.:  [id number]                   PATIENT TYPE:  OBV   LOCATION:  A427                                 FACILITY:  [facility]   PHYSICIAN:  Langley Gauss, M.D.                DATE OF BIRTH:  11/20/1945   DATE OF ADMISSION:  01/04/2003  DATE OF DISCHARGE:  01/05/2003                                 DISCHARGE SUMMARY   DISCHARGE DIAGNOSIS:  Cystocele/rectocele with anterior and posterior  repair.   FOLLOW UP:  The patient is given instructions to follow up in the office in  one week's time.  She will call or contact us sooner if she does have the  onset of malodorous discharge, fever or any increased bleeding.   DISCHARGE MEDICATIONS:  1. Peri-Colace one p.o. q.d. x7 days.  2. Tylox one to two every four to six hours p.r.n. postoperative pain, #20     with no refill.   SPECIAL INSTRUCTIONS:  The patient is given a work excuse for January 03, 2003, to January 24, 2003, or three weeks duration.   LABORATORY DATA AND X-RAY FINDINGS:  Admission hemoglobin and hematocrit  14.5 and 42.5.  Glucose 106.  Postoperative day #1, hemoglobin 12.9,  hematocrit 37.8 with a white count of 11.1.  A negative blood type.  Antibody screen is negative.  HCG is negative.   HOSPITAL COURSE:  The patient is for ambulatory surgery on January 04, 2003.  She was taken to the operating room where anterior and posterior repair was  performed without complications.  Dictated operative note is separate.  The  patient had a Foley catheter placed and a vaginal packing placed  postoperatively.  The patient was referred to the fourth floor.  The patient  did have some nausea and vomiting in the recovery room and later on the  fourth floor.  She received IV Phenergan, but found this to be excessively  sedating.  She remained afebrile.  She did request Foley catheter be  removed.  In the late p.m. of January 04, 2003, vaginal  packing was removed  as well as Foley catheter.  No active bleeding was noted vaginally.  The  patient did have relief of pressure with removal of the vaginal packing.  She, thereafter, remained afebrile.  She was able to ambulate and void  without difficulty.  She was assessed on November 05, 2002, and noted to have  no problems with bleeding, ambulating and voiding without difficulty.  On  examination, there was noted to be no bleeding occurring.  No evidence of  recurrent prolapse and suture lines intact.  The patient was discharged to  home on January 05, 2003.     ___________________________________________  Langley Gauss, M.D.   DC/MEDQ  D:  01/05/2003  T:  01/05/2003  Job:  [id number]

## 2010-08-17 ENCOUNTER — Encounter: Payer: Self-pay | Admitting: Internal Medicine

## 2011-07-01 DIAGNOSIS — E785 Hyperlipidemia, unspecified: Secondary | ICD-10-CM | POA: Diagnosis not present

## 2011-07-01 DIAGNOSIS — Z79899 Other long term (current) drug therapy: Secondary | ICD-10-CM | POA: Diagnosis not present

## 2011-07-08 DIAGNOSIS — E785 Hyperlipidemia, unspecified: Secondary | ICD-10-CM | POA: Diagnosis not present

## 2012-02-20 DIAGNOSIS — Z23 Encounter for immunization: Secondary | ICD-10-CM | POA: Diagnosis not present

## 2012-03-02 DIAGNOSIS — E785 Hyperlipidemia, unspecified: Secondary | ICD-10-CM | POA: Diagnosis not present

## 2012-03-09 DIAGNOSIS — E785 Hyperlipidemia, unspecified: Secondary | ICD-10-CM | POA: Diagnosis not present

## 2012-05-25 DIAGNOSIS — E785 Hyperlipidemia, unspecified: Secondary | ICD-10-CM | POA: Diagnosis not present

## 2012-05-25 DIAGNOSIS — Z79899 Other long term (current) drug therapy: Secondary | ICD-10-CM | POA: Diagnosis not present

## 2012-06-01 DIAGNOSIS — E785 Hyperlipidemia, unspecified: Secondary | ICD-10-CM | POA: Diagnosis not present

## 2012-06-01 DIAGNOSIS — Z79899 Other long term (current) drug therapy: Secondary | ICD-10-CM | POA: Diagnosis not present

## 2012-06-08 DIAGNOSIS — E785 Hyperlipidemia, unspecified: Secondary | ICD-10-CM | POA: Diagnosis not present

## 2012-09-21 DIAGNOSIS — J019 Acute sinusitis, unspecified: Secondary | ICD-10-CM | POA: Diagnosis not present

## 2012-09-30 ENCOUNTER — Other Ambulatory Visit (HOSPITAL_COMMUNITY): Payer: Self-pay | Admitting: Internal Medicine

## 2012-09-30 DIAGNOSIS — J329 Chronic sinusitis, unspecified: Secondary | ICD-10-CM

## 2012-10-02 ENCOUNTER — Ambulatory Visit (HOSPITAL_COMMUNITY)
Admission: RE | Admit: 2012-10-02 | Discharge: 2012-10-02 | Disposition: A | Discharge status: Home / Self Care | Payer: Medicare Other | Source: Ambulatory Visit | Attending: Internal Medicine | Admitting: Internal Medicine

## 2012-10-02 DIAGNOSIS — J329 Chronic sinusitis, unspecified: Secondary | ICD-10-CM

## 2012-10-02 DIAGNOSIS — J342 Deviated nasal septum: Secondary | ICD-10-CM | POA: Diagnosis not present

## 2012-10-03 ENCOUNTER — Emergency Department (HOSPITAL_COMMUNITY)
Admission: EM | Admit: 2012-10-03 | Discharge: 2012-10-03 | Disposition: A | Discharge status: Home / Self Care | Payer: Medicare Other | Attending: Emergency Medicine | Admitting: Emergency Medicine

## 2012-10-03 ENCOUNTER — Encounter (HOSPITAL_COMMUNITY): Payer: Self-pay | Admitting: *Deleted

## 2012-10-03 DIAGNOSIS — Z79899 Other long term (current) drug therapy: Secondary | ICD-10-CM | POA: Insufficient documentation

## 2012-10-03 DIAGNOSIS — J029 Acute pharyngitis, unspecified: Secondary | ICD-10-CM | POA: Diagnosis not present

## 2012-10-03 DIAGNOSIS — I10 Essential (primary) hypertension: Secondary | ICD-10-CM | POA: Diagnosis not present

## 2012-10-03 DIAGNOSIS — Z862 Personal history of diseases of the blood and blood-forming organs and certain disorders involving the immune mechanism: Secondary | ICD-10-CM | POA: Diagnosis not present

## 2012-10-03 DIAGNOSIS — Z8639 Personal history of other endocrine, nutritional and metabolic disease: Secondary | ICD-10-CM | POA: Insufficient documentation

## 2012-10-03 DIAGNOSIS — K219 Gastro-esophageal reflux disease without esophagitis: Secondary | ICD-10-CM | POA: Insufficient documentation

## 2012-10-03 DIAGNOSIS — IMO0001 Reserved for inherently not codable concepts without codable children: Secondary | ICD-10-CM

## 2012-10-03 NOTE — ED Provider Notes (Signed)
CSN: [id number]     Arrival date & time 10/03/12  0140 History     First MD Initiated Contact with Patient 10/03/12 0153     Chief Complaint  Patient presents with  . Sore Throat    throat is burning. had ct of head today.  throat has been burning for 2 months. states she has an bad taste in mouth for 2 months   (Consider location/radiation/quality/duration/timing/severity/associated sxs/prior Treatment) HPI HPI Comments: [name] is a 67 y.o. female with a h/o GERD who presents to the Emergency Department complaining of bad taste and smell she has had for two months.She is on Nexium. Tonight she was lying in bed and rolled over to her right side. The smell and taste became worse. She has been seeing Dr. Ouida Sills and he had her complete a CT of her sinuses today. She has a h/o hiatal hernia found on EGD done in 2011 by Dr. Jena Gauss. She denies fever, chills, nausea, vomiting, shortness of breath, cough.   PCP Dr. Ouida Sills  Past Medical History  Diagnosis Date  . GERD (gastroesophageal reflux disease)   . Hypercholesteremia    Past Surgical History  Procedure Laterality Date  . Tubal ligation    . Bladder tack      x 2  . Knee arthroscopy      Bilateral (torn meniscus)  . Rotator cuff repair    . Vesicovaginal fistula closure w/ tah     No family history on file. History  Substance Use Topics  . Smoking status: Not on file  . Smokeless tobacco: Not on file  . Alcohol Use: Not on file   OB History   Grav Para Term Preterm Abortions TAB SAB Ect Mult Living                 Review of Systems  Constitutional: Negative for fever.       10 Systems reviewed and are negative for acute change except as noted in the HPI.  HENT: Negative for congestion.   Eyes: Negative for discharge and redness.  Respiratory: Negative for cough and shortness of breath.   Cardiovascular: Negative for chest pain.  Gastrointestinal: Negative for vomiting and abdominal pain.       Bad taste and smell   Musculoskeletal: Negative for back pain.  Skin: Negative for rash.  Neurological: Negative for syncope, numbness and headaches.  Psychiatric/Behavioral:       No behavior change.    Allergies  Review of patient's allergies indicates not on file.  Home Medications   Current Outpatient Rx  Name  Route  Sig  Dispense  Refill  . esomeprazole (NEXIUM) 40 MG capsule   Oral   Take 40 mg by mouth daily before breakfast.           . co-enzyme Q-10 30 MG capsule   Oral   Take 100 mg by mouth 3 (three) times daily.           . colesevelam (WELCHOL) 625 MG tablet   Oral   Take 1,875 mg by mouth 2 (two) times daily with a meal.            BP 155/111  Pulse 96  Resp 19  SpO2 97% Physical Exam  Nursing note and vitals reviewed. Constitutional: She appears well-developed and well-nourished.  Awake, alert, nontoxic appearance.  HENT:  Head: Normocephalic and atraumatic.  Eyes: Right eye exhibits no discharge. Left eye exhibits no discharge.  Neck: Neck supple.  Cardiovascular: Normal rate and intact distal pulses.   Pulmonary/Chest: Effort normal and breath sounds normal. She exhibits no tenderness.  Abdominal: Soft. Bowel sounds are normal. There is no tenderness. There is no rebound.  Musculoskeletal: She exhibits no tenderness.  Baseline ROM, no obvious new focal weakness.  Neurological:  Mental status and motor strength appears baseline for patient and situation.  Skin: No rash noted.  Psychiatric: She has a normal mood and affect.    ED Course   Procedures (including critical care time)  Labs Reviewed - No data to display Ct Maxillofacial Wo Cm  10/02/2012   *RADIOLOGY REPORT*  Clinical Data: Sinusitis.  CT MAXILLOFACIAL WITHOUT CONTRAST  Technique:  Multidetector CT imaging of the maxillofacial structures was performed. Multiplanar CT image reconstructions were also generated.  Comparison: None  Findings: The paranasal sinuses are clear.  No air-fluid levels,  mucoperiosteal thickening, mucous retention cysts or polyps.  The mastoid air cells and middle ear cavities are clear.  Mild leftward deviation of the bony nasal septum and leftward spurring contributing to mild narrowing of the left inferior meatus.  There is moderate mucosal thickening of the middle and inferior turbinates.  Contra bullosa of the right middle turbinate is noted.  The ostiomeatal complexes are patent.  The globes are intact.  The visualized portion of the brain is normal.  IMPRESSION:  1.  No sinus disease. 2.  Clear ostiomeatal complexes. 3.  Leftward deviation of the lower bony nasal septum with leftward spurring contributing to narrowing of the left inferior meatus. 4.  Mucosal thickening of the middle and inferior turbinates. 5.  Concha bullosa of the right middle turbinate.   Original Report Authenticated By: Rudie Meyer, M.D.   1. Reflux    Reviewed the results of the CT with the patient.  MDM  Patient with a bad taste and smell she has had for two months. She has a h/o GERD and is on Nexium. She was encouraged to contact Dr. Jena Gauss on Monday and get set up for a new EGD. Pt stable in ED with no significant deterioration in condition.The patient appears reasonably screened and/or stabilized for discharge and I doubt any other medical condition or other [facility] requiring further screening, evaluation, or treatment in the ED at this time prior to discharge.  MDM Reviewed: nursing note and vitals     Nicoletta Dress. Colon Branch, MD 10/03/12 [phone]

## 2012-10-06 ENCOUNTER — Ambulatory Visit (INDEPENDENT_AMBULATORY_CARE_PROVIDER_SITE_OTHER): Payer: Medicare Other | Admitting: Gastroenterology

## 2012-10-06 ENCOUNTER — Telehealth: Payer: Self-pay

## 2012-10-06 ENCOUNTER — Encounter: Payer: Self-pay | Admitting: Gastroenterology

## 2012-10-06 VITALS — BP 169/82 | HR 81 | Temp 98.4°F | Ht 61.0 in | Wt 183.0 lb

## 2012-10-06 DIAGNOSIS — K219 Gastro-esophageal reflux disease without esophagitis: Secondary | ICD-10-CM | POA: Diagnosis not present

## 2012-10-06 DIAGNOSIS — Z1211 Encounter for screening for malignant neoplasm of colon: Secondary | ICD-10-CM | POA: Diagnosis not present

## 2012-10-06 MED ORDER — DEXLANSOPRAZOLE 60 MG PO CPDR
60.0000 mg | DELAYED_RELEASE_CAPSULE | Freq: Every day | ORAL | Status: DC
Start: 2012-10-06 — End: 2012-12-14

## 2012-10-06 NOTE — Telephone Encounter (Signed)
Pt called today because she was seen in the ER on the 26 th and (she said) they told her she need to have a EGD as soon as possible. She is having bad gas smells and a nasty taste in her mouth. I told her I would make her an appointment for August 25 and she said No that would not work because she has been like this for two months and she can not wait any longer because her body has been poisoned from this.I told her to go to the ER if she felt that bad and she said they was unable to help her last time.Please advise

## 2012-10-06 NOTE — Progress Notes (Signed)
 Primary Care Physician:  FAGAN,ROY, MD Primary Gastroenterologist:  Dr. Rourk   Chief Complaint  Patient presents with  . Gastrophageal Reflux  . Dysphagia    HPI:   [name] is a 66-year-old female who called into our office this morning with complaints of foul odor, "nasty taste" in her mouth. She actually called around 1pm. She presented to our lobby at 2pm, requesting to be seen. Due to an unforeseen cancellation, we offered to see her as a walk-in today.   Pertinent GI history to include severe GERD with a question of non-compliance due to finances. Last EGD in Dec 2011 with non-critical Schatzki's ring s/p dilation with 56 F dilator, small hiatal hernia, otherwise normal. She has not been seen since that time.   For 2 months, has had severe taste in her mouth, "gas" fumes. Occasional belching. States she tastes it and smells it in her mouth. Denies reflux. Throat feels sore. No dysphagia. Denies nausea. Head feels funny. No vomiting. No lack of appetite. No black, tarry stool. Upper abdomen "doesn't feel right". Feels like she isn't going to make it if she isn't treated fast. Anxious. No rectal bleeding. Denies early satiety. Metallic taste in mouth. Denies feeling bloated/gassy. Feels like trapped food is in her stomach, causing "fumes". Nexium 40 mg daily for last 2 weeks, prior to this would take "as needed". No improvement with this.    Per patient, she has had a Ccolonoscopy in remote past by Dr. Rourk. I am unable to locate this. No changes in bowel habits. Further review of chart reveals possible colonoscopy in 2007 by Dr. Jenkins.   Past Medical History  Diagnosis Date  . GERD (gastroesophageal reflux disease)   . Hypercholesteremia   . Hypertension     Past Surgical History  Procedure Laterality Date  . Tubal ligation    . Bladder tack      x 2  . Knee arthroscopy      Bilateral (torn meniscus)  . Rotator cuff repair    . Esophagogastroduodenoscopy (egd) with  esophageal dilation  Dec 2011    Dr. Rourk: non-critical Schatzki's ring s/p dilation with 56 F dilator, small hiatal hernia, otherwise normal  . Abdominal hysterectomy      Current Outpatient Prescriptions  Medication Sig Dispense Refill  . amLODipine (NORVASC) 5 MG tablet Take 5 mg by mouth daily.      . atorvastatin (LIPITOR) 20 MG tablet Take 20 mg by mouth daily.      . acidophilus (RISAQUAD) CAPS Take 1 capsule by mouth daily.      . dexlansoprazole (DEXILANT) 60 MG capsule Take 1 capsule (60 mg total) by mouth daily.  30 capsule  3   No current facility-administered medications for this visit.    Allergies as of 10/06/2012  . (No Known Allergies)    Family History  Problem Relation Age of Onset  . Colon cancer Neg Hx     History   Social History  . Marital Status: Divorced    Spouse Name: N/A    Number of Children: N/A  . Years of Education: N/A   Occupational History  . retired     [employer]   Social History Main Topics  . Smoking status: Never Smoker   . Smokeless tobacco: Not on file  . Alcohol Use: No  . Drug Use: No  . Sexually Active: Not on file   Other Topics Concern  . Not on file   Social History   Narrative  . No narrative on file    Review of Systems: Negative unless mentioned in HPI.   Physical Exam: BP 169/82  Pulse 81  Temp(Src) 98.4 F (36.9 C) (Oral)  Ht 5' 1" (1.549 m)  Wt 183 lb (83.008 kg)  BMI 34.6 kg/m2 General:   Alert and oriented. Extremely anxious. Head:  Normocephalic and atraumatic. Eyes:  Without icterus, sclera clear and conjunctiva pink.  Ears:  Normal auditory acuity. Nose:  No deformity, discharge,  or lesions. Mouth:  No deformity or lesions, oral mucosa pink.  Neck:  Supple, without mass or thyromegaly. Lungs:  Clear to auscultation bilaterally. No wheezes, rales, or rhonchi. No distress.  Heart:  S1, S2 present without murmurs appreciated.  Abdomen:  +BS, soft, non-tender and non-distended. No  HSM noted. No guarding or rebound. No masses appreciated.  Rectal:  Deferred  Msk:  Symmetrical without gross deformities. Normal posture. Extremities:  Without clubbing or edema. Neurologic:  Alert and  oriented x4;  grossly normal neurologically. Skin:  Intact without significant lesions or rashes. Cervical Nodes:  No significant cervical adenopathy. Psych:  Alert and cooperative. Normal mood and affect.   

## 2012-10-06 NOTE — Telephone Encounter (Signed)
Before I could answer this, patient actually showed up at the front window. We have had a cancellation at 2pm. She will be seen today.

## 2012-10-06 NOTE — Patient Instructions (Addendum)
Stop Nexium. Start taking Dexilant each morning with breakfast. We have provided samples.  We have set you up for an upper endoscopy with Dr. Jena Gauss in the near future.  Please have blood work done.   Start taking a probiotic daily. Some examples are Restora, Align, Digestive Advantage, [employer], [employer] brand.

## 2012-10-07 ENCOUNTER — Encounter (HOSPITAL_COMMUNITY): Payer: Self-pay | Admitting: Pharmacy Technician

## 2012-10-07 LAB — COMPREHENSIVE METABOLIC PANEL
ALT: 37 U/L — ABNORMAL HIGH (ref 0–35)
AST: 18 U/L (ref 0–37)
Albumin: 4.3 g/dL (ref 3.5–5.2)
Alkaline Phosphatase: 125 U/L — ABNORMAL HIGH (ref 39–117)
BUN: 17 mg/dL (ref 6–23)
CO2: 29 mEq/L (ref 19–32)
Calcium: 9.9 mg/dL (ref 8.4–10.5)
Chloride: 100 mEq/L (ref 96–112)
Creat: 0.69 mg/dL (ref 0.50–1.10)
Glucose, Bld: 113 mg/dL — ABNORMAL HIGH (ref 70–99)
Potassium: 4.7 mEq/L (ref 3.5–5.3)
Sodium: 137 mEq/L (ref 135–145)
Total Bilirubin: 0.5 mg/dL (ref 0.3–1.2)
Total Protein: 7.3 g/dL (ref 6.0–8.3)

## 2012-10-07 LAB — CBC WITH DIFFERENTIAL/PLATELET
Basophils Absolute: 0.1 10*3/uL (ref 0.0–0.1)
Basophils Relative: 1 % (ref 0–1)
Eosinophils Absolute: 0.1 10*3/uL (ref 0.0–0.7)
Eosinophils Relative: 1 % (ref 0–5)
HCT: 42.2 % (ref 36.0–46.0)
Hemoglobin: 14.6 g/dL (ref 12.0–15.0)
Lymphocytes Relative: 31 % (ref 12–46)
Lymphs Abs: 3.2 10*3/uL (ref 0.7–4.0)
MCH: 29.9 pg (ref 26.0–34.0)
MCHC: 34.6 g/dL (ref 30.0–36.0)
MCV: 86.5 fL (ref 78.0–100.0)
Monocytes Absolute: 0.7 10*3/uL (ref 0.1–1.0)
Monocytes Relative: 7 % (ref 3–12)
Neutro Abs: 6.4 10*3/uL (ref 1.7–7.7)
Neutrophils Relative %: 60 % (ref 43–77)
Platelets: 329 10*3/uL (ref 150–400)
RBC: 4.88 MIL/uL (ref 3.87–5.11)
RDW: 13 % (ref 11.5–15.5)
WBC: 10.5 10*3/uL (ref 4.0–10.5)

## 2012-10-08 DIAGNOSIS — Z1211 Encounter for screening for malignant neoplasm of colon: Secondary | ICD-10-CM | POA: Insufficient documentation

## 2012-10-08 DIAGNOSIS — K219 Gastro-esophageal reflux disease without esophagitis: Secondary | ICD-10-CM | POA: Insufficient documentation

## 2012-10-08 NOTE — Assessment & Plan Note (Signed)
66 year old very anxious female with a history of severe GERD, now presenting with vague and multiple symptoms to include smelling "gas fumes", severe taste in mouth, occasional belching, metallic taste in mouth, and worried about "trapped food" in her stomach. States upper abdomen "doesn't feel right". She is really unable to accurately describe to me her symptoms, but she is quite concerned she will "not make it" if something is not done soon. She has been taking Nexium 40 mg daily for 2 weeks without any improvement.   We will stop Nexium and switch to a trial of Dexilant. Last EGD in 2011 with non-critical Schatzki's ring s/p dilation, otherwise normal. Recommend repeat EGD in near future with Dr. Jena Gauss. I discussed the risks and benefits with patient in detail, and she states understanding. If EGD negative, may need to consider head CT to rule out CNS process  (recent maxillofacial CT without sinus disease).

## 2012-10-08 NOTE — Assessment & Plan Note (Signed)
Unclear when last colonoscopy performed. Appears Dr. Lovell Sheehan may have performed a colonoscopy in 2007. Will need to request records. No lower GI symptoms at this time.

## 2012-10-12 ENCOUNTER — Encounter (HOSPITAL_COMMUNITY)
Admission: RE | Disposition: A | Discharge status: Home / Self Care | Payer: Self-pay | Source: Ambulatory Visit | Attending: Internal Medicine

## 2012-10-12 ENCOUNTER — Ambulatory Visit (HOSPITAL_COMMUNITY)
Admission: RE | Admit: 2012-10-12 | Discharge: 2012-10-12 | Disposition: A | Discharge status: Home / Self Care | Payer: Medicare Other | Source: Ambulatory Visit | Attending: Internal Medicine | Admitting: Internal Medicine

## 2012-10-12 DIAGNOSIS — K449 Diaphragmatic hernia without obstruction or gangrene: Secondary | ICD-10-CM | POA: Diagnosis not present

## 2012-10-12 DIAGNOSIS — K319 Disease of stomach and duodenum, unspecified: Secondary | ICD-10-CM | POA: Diagnosis not present

## 2012-10-12 DIAGNOSIS — R1310 Dysphagia, unspecified: Secondary | ICD-10-CM | POA: Insufficient documentation

## 2012-10-12 DIAGNOSIS — I10 Essential (primary) hypertension: Secondary | ICD-10-CM | POA: Diagnosis not present

## 2012-10-12 DIAGNOSIS — R439 Unspecified disturbances of smell and taste: Secondary | ICD-10-CM

## 2012-10-12 DIAGNOSIS — K219 Gastro-esophageal reflux disease without esophagitis: Secondary | ICD-10-CM

## 2012-10-12 HISTORY — PX: ESOPHAGOGASTRODUODENOSCOPY: SHX5428

## 2012-10-12 SURGERY — EGD (ESOPHAGOGASTRODUODENOSCOPY)
Anesthesia: Moderate Sedation

## 2012-10-12 MED ORDER — BUTAMBEN-TETRACAINE-BENZOCAINE 2-2-14 % EX AERO
INHALATION_SPRAY | CUTANEOUS | Status: DC | PRN
  Administered 2012-10-12: 1 via TOPICAL

## 2012-10-12 MED ORDER — STERILE WATER FOR IRRIGATION IR SOLN
Status: DC | PRN
  Administered 2012-10-12: 13:00:00

## 2012-10-12 MED ORDER — MEPERIDINE HCL 100 MG/ML IJ SOLN
INTRAMUSCULAR | Status: AC
  Filled 2012-10-12: qty 1

## 2012-10-12 MED ORDER — MEPERIDINE HCL 100 MG/ML IJ SOLN
INTRAMUSCULAR | Status: DC | PRN
  Administered 2012-10-12 (×2): 25 mg via INTRAVENOUS
  Administered 2012-10-12: 50 mg via INTRAVENOUS
  Administered 2012-10-12 (×2): 25 mg via INTRAVENOUS

## 2012-10-12 MED ORDER — SODIUM CHLORIDE 0.9 % IV SOLN
INTRAVENOUS | Status: DC
  Administered 2012-10-12: 12:00:00 via INTRAVENOUS

## 2012-10-12 MED ORDER — ONDANSETRON HCL 4 MG/2ML IJ SOLN
INTRAMUSCULAR | Status: AC
  Filled 2012-10-12: qty 2

## 2012-10-12 MED ORDER — ONDANSETRON 4 MG PO TBDP
4.0000 mg | ORAL_TABLET | Freq: Once | ORAL | Status: AC
  Administered 2012-10-12: 4 mg via ORAL

## 2012-10-12 MED ORDER — ONDANSETRON 4 MG PO TBDP
ORAL_TABLET | ORAL | Status: AC
  Filled 2012-10-12: qty 1

## 2012-10-12 MED ORDER — ONDANSETRON HCL 4 MG/2ML IJ SOLN
INTRAMUSCULAR | Status: DC | PRN
  Administered 2012-10-12: 4 mg via INTRAVENOUS

## 2012-10-12 MED ORDER — MIDAZOLAM HCL 5 MG/5ML IJ SOLN
INTRAMUSCULAR | Status: DC | PRN
  Administered 2012-10-12 (×4): 1 mg via INTRAVENOUS
  Administered 2012-10-12: 2 mg via INTRAVENOUS

## 2012-10-12 MED ORDER — MIDAZOLAM HCL 5 MG/5ML IJ SOLN
INTRAMUSCULAR | Status: AC
  Filled 2012-10-12: qty 10

## 2012-10-12 NOTE — Interval H&P Note (Signed)
History and Physical Interval Note:  10/12/2012 12:42 PM  [name]  has presented today for surgery, with the diagnosis of GERD  The various methods of treatment have been discussed with the patient and family. After consideration of risks, benefits and other options for treatment, the patient has consented to  Procedure(s) with comments: ESOPHAGOGASTRODUODENOSCOPY (EGD) (N/A) - 1:00 as a surgical intervention .  The patient's history has been reviewed, patient examined, no change in status, stable for surgery.  I have reviewed the patient's chart and labs.  Questions were answered to the patient's satisfaction.     Minimal elevation of alkaline phosphatase and ALT. CBC okay. No improvement with Dexilant. EGD now being performed.The risks, benefits, limitations, alternatives and imponderables have been reviewed with the patient. Potential for esophageal dilation, biopsy, etc. have also been reviewed.  Questions have been answered. All parties agreeable.  Eula Listen

## 2012-10-12 NOTE — H&P (View-Only) (Signed)
Primary Care Physician:  Carylon Perches, MD Primary Gastroenterologist:  Dr. Jena Gauss   Chief Complaint  Patient presents with  . Gastrophageal Reflux  . Dysphagia    HPI:   [name] is a 67 year old female who called into our office this morning with complaints of foul odor, "nasty taste" in her mouth. She actually called around 1pm. She presented to our lobby at 2pm, requesting to be seen. Due to an unforeseen cancellation, we offered to see her as a walk-in today.   Pertinent GI history to include severe GERD with a question of non-compliance due to finances. Last EGD in Dec 2011 with non-critical Schatzki's ring s/p dilation with 56 F dilator, small hiatal hernia, otherwise normal. She has not been seen since that time.   For 2 months, has had severe taste in her mouth, "gas" fumes. Occasional belching. States she tastes it and smells it in her mouth. Denies reflux. Throat feels sore. No dysphagia. Denies nausea. Head feels funny. No vomiting. No lack of appetite. No black, tarry stool. Upper abdomen "doesn't feel right". Feels like she isn't going to make it if she isn't treated fast. Anxious. No rectal bleeding. Denies early satiety. Metallic taste in mouth. Denies feeling bloated/gassy. Feels like trapped food is in her stomach, causing "fumes". Nexium 40 mg daily for last 2 weeks, prior to this would take "as needed". No improvement with this.    Per patient, she has had a Ccolonoscopy in remote past by Dr. Jena Gauss. I am unable to locate this. No changes in bowel habits. Further review of chart reveals possible colonoscopy in 2007 by Dr. Lovell Sheehan.   Past Medical History  Diagnosis Date  . GERD (gastroesophageal reflux disease)   . Hypercholesteremia   . Hypertension     Past Surgical History  Procedure Laterality Date  . Tubal ligation    . Bladder tack      x 2  . Knee arthroscopy      Bilateral (torn meniscus)  . Rotator cuff repair    . Esophagogastroduodenoscopy (egd) with  esophageal dilation  Dec 2011    Dr. Jena Gauss: non-critical Schatzki's ring s/p dilation with 56 F dilator, small hiatal hernia, otherwise normal  . Abdominal hysterectomy      Current Outpatient Prescriptions  Medication Sig Dispense Refill  . amLODipine (NORVASC) 5 MG tablet Take 5 mg by mouth daily.      Marland Kitchen atorvastatin (LIPITOR) 20 MG tablet Take 20 mg by mouth daily.      Marland Kitchen acidophilus (RISAQUAD) CAPS Take 1 capsule by mouth daily.      Marland Kitchen dexlansoprazole (DEXILANT) 60 MG capsule Take 1 capsule (60 mg total) by mouth daily.  30 capsule  3   No current facility-administered medications for this visit.    Allergies as of 10/06/2012  . (No Known Allergies)    Family History  Problem Relation Age of Onset  . Colon cancer Neg Hx     History   Social History  . Marital Status: Divorced    Spouse Name: N/A    Number of Children: N/A  . Years of Education: N/A   Occupational History  . retired     [employer]   Social History Main Topics  . Smoking status: Never Smoker   . Smokeless tobacco: Not on file  . Alcohol Use: No  . Drug Use: No  . Sexually Active: Not on file   Other Topics Concern  . Not on file   Social History  Narrative  . No narrative on file    Review of Systems: Negative unless mentioned in HPI.   Physical Exam: BP 169/82  Pulse 81  Temp(Src) 98.4 F (36.9 C) (Oral)  Ht 5\' 1"  (1.549 m)  Wt 183 lb (83.008 kg)  BMI 34.6 kg/m2 General:   Alert and oriented. Extremely anxious. Head:  Normocephalic and atraumatic. Eyes:  Without icterus, sclera clear and conjunctiva pink.  Ears:  Normal auditory acuity. Nose:  No deformity, discharge,  or lesions. Mouth:  No deformity or lesions, oral mucosa pink.  Neck:  Supple, without mass or thyromegaly. Lungs:  Clear to auscultation bilaterally. No wheezes, rales, or rhonchi. No distress.  Heart:  S1, S2 present without murmurs appreciated.  Abdomen:  +BS, soft, non-tender and non-distended. No  HSM noted. No guarding or rebound. No masses appreciated.  Rectal:  Deferred  Msk:  Symmetrical without gross deformities. Normal posture. Extremities:  Without clubbing or edema. Neurologic:  Alert and  oriented x4;  grossly normal neurologically. Skin:  Intact without significant lesions or rashes. Cervical Nodes:  No significant cervical adenopathy. Psych:  Alert and cooperative. Normal mood and affect.

## 2012-10-12 NOTE — Op Note (Signed)
[facility] [address] [place], [address]   ENDOSCOPY PROCEDURE REPORT  PATIENT: [name], [name]  MR#: [id number] BIRTHDATE: 12/18/1945 , 66  yrs. old GENDER: Female ENDOSCOPIST: R.  Roetta Sessions, MD FACP FACG REFERRED BY:  Carylon Perches, M.D. PROCEDURE DATE:  10/12/2012 PROCEDURE:     EGD with gastric biopsy  INDICATIONS:     dysgeusia  INFORMED CONSENT:   The risks, benefits, limitations, alternatives and imponderables have been discussed.  The potential for biopsy, esophogeal dilation, etc. have also been reviewed.  Questions have been answered.  All parties agreeable.  Please see the history and physical in the medical record for more information.  MEDICATIONS:    Versed 6 mg IV and Demerol 150 mg IV in divided doses. Cetacaine spray. Zofran 4 mg IV  DESCRIPTION OF PROCEDURE:   The [id number] ([id number])  endoscope was introduced through the mouth and advanced to the second portion of the duodenum without difficulty or limitations.  The mucosal surfaces were surveyed very carefully during advancement of the scope and upon withdrawal.  Retroflexion view of the proximal stomach and esophagogastric junction was performed.      FINDINGS: Small inlet patch; otherwise normal tubular esophagus. I did not identify a Zenker's diverticulum. Stomach empty. Small hiatal hernia. Patent pylorus. Normal-appearing first and second portion of the duodenum  THERAPEUTIC / DIAGNOSTIC MANEUVERS PERFORMED:  Biopsies of the gastric mucosa taken to evaluate for Helicobacter pylori.  COMPLICATIONS:  None  IMPRESSION:   Small inlet patch.  Small hiatal hernia.  Status post gastric biopsy  RECOMMENDATIONS:  Continue Dexilant 60 mg daily. Add align, as a probiotic supplement. Further recommendations to follow pending review of pathology report.    _______________________________ R. Roetta Sessions, MD FACP [facility] eSigned:  R. Roetta Sessions, MD FACP [facility] 10/12/2012 1:20  PM     CC:

## 2012-10-14 ENCOUNTER — Encounter (HOSPITAL_COMMUNITY): Payer: Self-pay | Admitting: Internal Medicine

## 2012-10-14 ENCOUNTER — Other Ambulatory Visit: Payer: Self-pay

## 2012-10-14 NOTE — Progress Notes (Signed)
Quick Note:  Completely normal CBC CMP reviewed with slightly elevated blood glucose. Unclear if she was fasting or not.  Mild elevation of alk phos and ALT, non-specific. Would recommend recheck in 8 weeks or so, fasting.  How is patient doing since EGD? ______

## 2012-10-14 NOTE — Progress Notes (Signed)
Cc PCP 

## 2012-10-16 ENCOUNTER — Encounter: Payer: Self-pay | Admitting: Internal Medicine

## 2012-10-19 ENCOUNTER — Encounter: Payer: Self-pay | Admitting: Internal Medicine

## 2012-10-19 ENCOUNTER — Telehealth: Payer: Self-pay | Admitting: *Deleted

## 2012-10-19 NOTE — Progress Notes (Unsigned)
Letter from: Corbin Ade  Reason for Letter: Results Review Send letter to patient.  Send copy of letter with path to referring provider and PCP.  Needs ov w ext in 4- 6 weeks

## 2012-10-19 NOTE — Telephone Encounter (Signed)
Spoke with pt

## 2012-10-19 NOTE — Progress Notes (Signed)
Pt is aware of OV on 9/22 at 10 with AS and appt card was mailed

## 2012-10-19 NOTE — Progress Notes (Signed)
Quick Note:  Pt is aware. She still feels like she is "being poisoned" she feels like there is a gas around her all the time that is coming from her own body that is poisoning her and she has a metallic taste in her mouth. She stated it was worse now than it was before. She stated AS mentioned a "breath test" and pt would like to have it scheduled (nothing noted in AS ov note).   Please advise if we need to schedule.  Lab order on file. ______

## 2012-10-19 NOTE — Telephone Encounter (Signed)
Pt called wanting results from her test last Monday. Please advise [phone]

## 2012-10-20 ENCOUNTER — Other Ambulatory Visit: Payer: Self-pay

## 2012-10-20 DIAGNOSIS — R748 Abnormal levels of other serum enzymes: Secondary | ICD-10-CM

## 2012-10-26 ENCOUNTER — Other Ambulatory Visit: Payer: Self-pay | Admitting: Gastroenterology

## 2012-10-26 DIAGNOSIS — G8929 Other chronic pain: Secondary | ICD-10-CM

## 2012-10-26 DIAGNOSIS — R140 Abdominal distension (gaseous): Secondary | ICD-10-CM

## 2012-10-26 NOTE — Progress Notes (Signed)
Quick Note:  Please arrange for CT, make sure it says "with pancreatic protocol" to assess for occult malignancy.  Follow-up with me after CT is completed (a day or so later, so the results are available).    ______

## 2012-10-28 ENCOUNTER — Ambulatory Visit (HOSPITAL_COMMUNITY)
Admission: RE | Admit: 2012-10-28 | Discharge: 2012-10-28 | Disposition: A | Discharge status: Home / Self Care | Payer: Medicare Other | Source: Ambulatory Visit | Attending: Gastroenterology | Admitting: Gastroenterology

## 2012-10-28 DIAGNOSIS — R142 Eructation: Secondary | ICD-10-CM | POA: Insufficient documentation

## 2012-10-28 DIAGNOSIS — R109 Unspecified abdominal pain: Secondary | ICD-10-CM | POA: Diagnosis not present

## 2012-10-28 DIAGNOSIS — N281 Cyst of kidney, acquired: Secondary | ICD-10-CM | POA: Diagnosis not present

## 2012-10-28 DIAGNOSIS — G8929 Other chronic pain: Secondary | ICD-10-CM

## 2012-10-28 DIAGNOSIS — R143 Flatulence: Secondary | ICD-10-CM | POA: Diagnosis not present

## 2012-10-28 DIAGNOSIS — I700 Atherosclerosis of aorta: Secondary | ICD-10-CM | POA: Insufficient documentation

## 2012-10-28 DIAGNOSIS — R141 Gas pain: Secondary | ICD-10-CM | POA: Insufficient documentation

## 2012-10-28 DIAGNOSIS — Z90710 Acquired absence of both cervix and uterus: Secondary | ICD-10-CM | POA: Diagnosis not present

## 2012-10-28 DIAGNOSIS — R140 Abdominal distension (gaseous): Secondary | ICD-10-CM

## 2012-10-28 MED ORDER — IOHEXOL 300 MG/ML  SOLN
100.0000 mL | Freq: Once | INTRAMUSCULAR | Status: AC | PRN
  Administered 2012-10-28: 100 mL via INTRAVENOUS

## 2012-10-29 NOTE — Progress Notes (Signed)
Pt has a appt. With AS on 11/30/12 and 12/14/12 for a follow up.

## 2012-11-02 ENCOUNTER — Other Ambulatory Visit: Payer: Self-pay

## 2012-11-02 DIAGNOSIS — R748 Abnormal levels of other serum enzymes: Secondary | ICD-10-CM

## 2012-11-04 NOTE — Progress Notes (Signed)
Quick Note:  Normal CT Is she still having issues ______

## 2012-11-13 DIAGNOSIS — R748 Abnormal levels of other serum enzymes: Secondary | ICD-10-CM | POA: Diagnosis not present

## 2012-11-13 LAB — CBC WITH DIFFERENTIAL/PLATELET
Basophils Absolute: 0 10*3/uL (ref 0.0–0.1)
Basophils Relative: 0 % (ref 0–1)
Eosinophils Absolute: 0.2 10*3/uL (ref 0.0–0.7)
Eosinophils Relative: 2 % (ref 0–5)
HCT: 42 % (ref 36.0–46.0)
Hemoglobin: 14.2 g/dL (ref 12.0–15.0)
Lymphocytes Relative: 44 % (ref 12–46)
Lymphs Abs: 2.9 10*3/uL (ref 0.7–4.0)
MCH: 29.5 pg (ref 26.0–34.0)
MCHC: 33.8 g/dL (ref 30.0–36.0)
MCV: 87.1 fL (ref 78.0–100.0)
Monocytes Absolute: 0.5 10*3/uL (ref 0.1–1.0)
Monocytes Relative: 8 % (ref 3–12)
Neutro Abs: 3 10*3/uL (ref 1.7–7.7)
Neutrophils Relative %: 46 % (ref 43–77)
Platelets: 269 10*3/uL (ref 150–400)
RBC: 4.82 MIL/uL (ref 3.87–5.11)
RDW: 13 % (ref 11.5–15.5)
WBC: 6.7 10*3/uL (ref 4.0–10.5)

## 2012-11-16 DIAGNOSIS — R439 Unspecified disturbances of smell and taste: Secondary | ICD-10-CM | POA: Diagnosis not present

## 2012-11-16 DIAGNOSIS — J342 Deviated nasal septum: Secondary | ICD-10-CM | POA: Diagnosis not present

## 2012-11-19 DIAGNOSIS — R439 Unspecified disturbances of smell and taste: Secondary | ICD-10-CM | POA: Diagnosis not present

## 2012-11-19 DIAGNOSIS — R930 Abnormal findings on diagnostic imaging of skull and head, not elsewhere classified: Secondary | ICD-10-CM | POA: Diagnosis not present

## 2012-11-30 ENCOUNTER — Ambulatory Visit: Payer: Medicare Other | Admitting: Gastroenterology

## 2012-12-09 ENCOUNTER — Encounter: Payer: Self-pay | Admitting: Internal Medicine

## 2012-12-10 DIAGNOSIS — R439 Unspecified disturbances of smell and taste: Secondary | ICD-10-CM | POA: Diagnosis not present

## 2012-12-14 ENCOUNTER — Encounter (HOSPITAL_COMMUNITY): Payer: Self-pay | Admitting: *Deleted

## 2012-12-14 ENCOUNTER — Ambulatory Visit (INDEPENDENT_AMBULATORY_CARE_PROVIDER_SITE_OTHER): Payer: Medicare Other | Admitting: Gastroenterology

## 2012-12-14 ENCOUNTER — Emergency Department (HOSPITAL_COMMUNITY)
Admission: EM | Admit: 2012-12-14 | Discharge: 2012-12-14 | Disposition: A | Discharge status: Home / Self Care | Payer: Medicare Other | Attending: Emergency Medicine | Admitting: Emergency Medicine

## 2012-12-14 ENCOUNTER — Encounter: Payer: Self-pay | Admitting: Gastroenterology

## 2012-12-14 VITALS — BP 143/83 | HR 89 | Temp 97.9°F | Ht 61.0 in | Wt 186.8 lb

## 2012-12-14 DIAGNOSIS — Z79899 Other long term (current) drug therapy: Secondary | ICD-10-CM | POA: Diagnosis not present

## 2012-12-14 DIAGNOSIS — Z1211 Encounter for screening for malignant neoplasm of colon: Secondary | ICD-10-CM

## 2012-12-14 DIAGNOSIS — K219 Gastro-esophageal reflux disease without esophagitis: Secondary | ICD-10-CM | POA: Insufficient documentation

## 2012-12-14 DIAGNOSIS — R439 Unspecified disturbances of smell and taste: Secondary | ICD-10-CM | POA: Insufficient documentation

## 2012-12-14 DIAGNOSIS — R431 Parosmia: Secondary | ICD-10-CM

## 2012-12-14 DIAGNOSIS — I10 Essential (primary) hypertension: Secondary | ICD-10-CM | POA: Diagnosis not present

## 2012-12-14 DIAGNOSIS — R432 Parageusia: Secondary | ICD-10-CM

## 2012-12-14 DIAGNOSIS — E7800 Pure hypercholesterolemia, unspecified: Secondary | ICD-10-CM | POA: Diagnosis not present

## 2012-12-14 LAB — CBC WITH DIFFERENTIAL/PLATELET
Basophils Absolute: 0 10*3/uL (ref 0.0–0.1)
Basophils Relative: 0 % (ref 0–1)
Eosinophils Absolute: 0.1 10*3/uL (ref 0.0–0.7)
Eosinophils Relative: 2 % (ref 0–5)
HCT: 40.3 % (ref 36.0–46.0)
Hemoglobin: 14.3 g/dL (ref 12.0–15.0)
Lymphocytes Relative: 34 % (ref 12–46)
Lymphs Abs: 3 10*3/uL (ref 0.7–4.0)
MCH: 31 pg (ref 26.0–34.0)
MCHC: 35.5 g/dL (ref 30.0–36.0)
MCV: 87.4 fL (ref 78.0–100.0)
Monocytes Absolute: 0.6 10*3/uL (ref 0.1–1.0)
Monocytes Relative: 6 % (ref 3–12)
Neutro Abs: 5.1 10*3/uL (ref 1.7–7.7)
Neutrophils Relative %: 58 % (ref 43–77)
Platelets: 267 10*3/uL (ref 150–400)
RBC: 4.61 MIL/uL (ref 3.87–5.11)
RDW: 12.6 % (ref 11.5–15.5)
WBC: 8.8 10*3/uL (ref 4.0–10.5)

## 2012-12-14 LAB — COMPREHENSIVE METABOLIC PANEL
ALT: 26 U/L (ref 0–35)
AST: 27 U/L (ref 0–37)
Albumin: 3.9 g/dL (ref 3.5–5.2)
Alkaline Phosphatase: 127 U/L — ABNORMAL HIGH (ref 39–117)
BUN: 17 mg/dL (ref 6–23)
CO2: 25 mEq/L (ref 19–32)
Calcium: 9.4 mg/dL (ref 8.4–10.5)
Chloride: 104 mEq/L (ref 96–112)
Creatinine, Ser: 0.7 mg/dL (ref 0.50–1.10)
GFR calc Af Amer: >90 mL/min (ref >90)
GFR calc non Af Amer: 87 mL/min — ABNORMAL LOW (ref >90)
Glucose, Bld: 109 mg/dL — ABNORMAL HIGH (ref 70–99)
Potassium: 4 mEq/L (ref 3.5–5.1)
Sodium: 141 mEq/L (ref 135–145)
Total Bilirubin: 0.2 mg/dL — ABNORMAL LOW (ref 0.3–1.2)
Total Protein: 7.4 g/dL (ref 6.0–8.3)

## 2012-12-14 LAB — LIPASE, BLOOD: Lipase: 54 U/L (ref 11–59)

## 2012-12-14 NOTE — Progress Notes (Signed)
Referring Provider: Carylon Perches, MD Primary Care Physician:  Carylon Perches, MD Primary GI: Dr. Jena Gauss   Chief Complaint  Patient presents with  . Follow-up    HPI:   Presents today in follow-up secondary to dysgeusia. EGD with small inlet patch, small hiatal hernia, normal path. CT abd/pelvis performed to assess for occult malignancy, normal. Feels like she is smelling formaldehyde. Sometimes upper abdominal discomfort/vague. States nose is burning, smell is so strong. Went to dentist, thought it was her tooth. Nexium chronically. Smell issues prior to even taking probiotic. Feels like it is worse. Feels like food is stuck somewhere and "rotting". No constipation, diarrhea. Appetite good, "too good". Denies nausea. Went to ENT last Friday, states didn't see anything. Patient completed MRI as well and brought copies from [facility]. Negative MRI, to be scanned into epic. Doesn't want to switch from Nexium to another PPI.   Past Medical History  Diagnosis Date  . GERD (gastroesophageal reflux disease)   . Hypercholesteremia   . Hypertension     Past Surgical History  Procedure Laterality Date  . Tubal ligation    . Bladder tack      x 2  . Knee arthroscopy      Bilateral (torn meniscus)  . Rotator cuff repair    . Esophagogastroduodenoscopy (egd) with esophageal dilation  Dec 2011    Dr. Jena Gauss: non-critical Schatzki's ring s/p dilation with 56 F dilator, small hiatal hernia, otherwise normal  . Abdominal hysterectomy    . Esophagogastroduodenoscopy N/A 10/12/2012    RMR: Small inlet patch.  Small hiatal hernia.  Status post gastric biopsy, negative H.pylori    Current Outpatient Prescriptions  Medication Sig Dispense Refill  . acidophilus (RISAQUAD) CAPS Take 1 capsule by mouth daily.      Marland Kitchen esomeprazole (NEXIUM) 40 MG capsule Take 40 mg by mouth daily before breakfast.       No current facility-administered medications for this visit.    Allergies as of 12/14/2012  . (No Known  Allergies)    Family History  Problem Relation Age of Onset  . Colon cancer Neg Hx     History   Social History  . Marital Status: Divorced    Spouse Name: N/A    Number of Children: N/A  . Years of Education: N/A   Occupational History  . retired     [employer]   Social History Main Topics  . Smoking status: Never Smoker   . Smokeless tobacco: None  . Alcohol Use: No  . Drug Use: No  . Sexual Activity: None   Other Topics Concern  . None   Social History Narrative  . None    Review of Systems: Negative unless mentioned in HPI.   Physical Exam: BP 143/83  Pulse 89  Temp(Src) 97.9 F (36.6 C) (Oral)  Ht 5\' 1"  (1.549 m)  Wt 186 lb 12.8 oz (84.732 kg)  BMI 35.31 kg/m2 General:   Alert and oriented. No distress noted. Pleasant and cooperative.  Head:  Normocephalic and atraumatic. Eyes:  Conjuctiva clear without scleral icterus. Mouth:  Oral mucosa pink and moist. Good dentition. No lesions. Heart:  S1, S2 present without murmurs, rubs, or gallops. Regular rate and rhythm. Abdomen:  +BS, soft, non-tender and non-distended. No rebound or guarding. No HSM or masses noted. Msk:  Symmetrical without gross deformities. Normal posture. Extremities:  Without edema. Neurologic:  Alert and  oriented x4;  grossly normal neurologically. Skin:  Intact without significant lesions or rashes. Psych:  Alert and cooperative. Normal mood and affect.

## 2012-12-14 NOTE — ED Provider Notes (Signed)
CSN: [id number]     Arrival date & time 12/14/12  4098 History   First MD Initiated Contact with Patient 12/14/12 [phone]     Chief Complaint  Patient presents with  . Thinks She is Rotting from the Inside    (Consider location/radiation/quality/duration/timing/severity/associated sxs/prior Treatment) HPI Comments: Patient is a 67 year old female with past medical history of hypertension and high cholesterol. Presents to the emergency department this morning with complaints of a 4 month history of a funny smell she states is coming from her mouth and is inside her head. She states it smells like formaldehyde. She states she feels as if she is being poisoned. No one else around her can smell this, it is just her. She has been seen by gastroenterology and [facility] and had an endoscopy done recently however this was unremarkable. She's also had a CT scan of her sinuses and abdomen and pelvis which were negative as well. She denies to me she is having any discomfort, shortness of breath, or other problems. She is not diabetic and laboratory tests S4 abdomen unremarkable. She lives alone and states that her daughter is supplied by well at home.  The history is provided by the patient.    Past Medical History  Diagnosis Date  . GERD (gastroesophageal reflux disease)   . Hypercholesteremia   . Hypertension    Past Surgical History  Procedure Laterality Date  . Tubal ligation    . Bladder tack      x 2  . Knee arthroscopy      Bilateral (torn meniscus)  . Rotator cuff repair    . Esophagogastroduodenoscopy (egd) with esophageal dilation  Dec 2011    Dr. Jena Gauss: non-critical Schatzki's ring s/p dilation with 56 F dilator, small hiatal hernia, otherwise normal  . Abdominal hysterectomy    . Esophagogastroduodenoscopy N/A 10/12/2012    RMR: Small inlet patch.  Small hiatal hernia.  Status post gastric biopsy   Family History  Problem Relation Age of Onset  . Colon cancer Neg Hx    History   Substance Use Topics  . Smoking status: Never Smoker   . Smokeless tobacco: Not on file  . Alcohol Use: No   OB History   Grav Para Term Preterm Abortions TAB SAB Ect Mult Living                 Review of Systems  All other systems reviewed and are negative.    Allergies  Review of patient's allergies indicates no known allergies.  Home Medications   Current Outpatient Rx  Name  Route  Sig  Dispense  Refill  . acidophilus (RISAQUAD) CAPS   Oral   Take 1 capsule by mouth daily.         Marland Kitchen amLODipine (NORVASC) 5 MG tablet   Oral   Take 5 mg by mouth daily.         Marland Kitchen atorvastatin (LIPITOR) 20 MG tablet   Oral   Take 20 mg by mouth daily.         Marland Kitchen dexlansoprazole (DEXILANT) 60 MG capsule   Oral   Take 1 capsule (60 mg total) by mouth daily.   30 capsule   3    BP 157/84  Pulse 74  Temp(Src) 98.2 F (36.8 C)  Resp 18  SpO2 100% Physical Exam  Nursing note and vitals reviewed. Constitutional: She is oriented to person, place, and time. She appears well-developed and well-nourished. No distress.  HENT:  Head: Normocephalic  and atraumatic.  Mouth/Throat: Oropharynx is clear and moist.  I am unable to appreciate any smells.  Neck: Normal range of motion. Neck supple.  Cardiovascular: Normal rate and regular rhythm.  Exam reveals no gallop and no friction rub.   No murmur heard. Pulmonary/Chest: Effort normal and breath sounds normal. No respiratory distress. She has no wheezes.  Abdominal: Soft. Bowel sounds are normal. She exhibits no distension. There is no tenderness.  Musculoskeletal: Normal range of motion.  Neurological: She is alert and oriented to person, place, and time.  Skin: Skin is warm and dry. She is not diaphoretic.    ED Course  Procedures (including critical care time) Labs Review Labs Reviewed  CBC WITH DIFFERENTIAL  COMPREHENSIVE METABOLIC PANEL  LIPASE, BLOOD   Imaging Review No results found.  MDM  No diagnosis  found. Patient presents here with complaints of an odd formaldehyde-like smell that she states has been exuding from her mouth for the past 4 months. Nobody has been able to identify the cause of this. She has an appointment to see Dr. Jena Gauss who is a gastroenterologist and [place] this afternoon. She came this morning as the symptoms have been quite distressing to her. I am unable to appreciate any odd smells and the patient appears quite stable. I did check basic laboratory studies which were unremarkable. Her blood sugar specifically was normal. She will be advised to keep her appointment for this afternoon to discuss further workup and management. I am unsure as to what the cause of this is however I do not feel as though there is an emergent situation that requires inpatient treatment or further emergent workup.    Geoffery Lyons, MD 12/14/12 [phone]

## 2012-12-14 NOTE — Patient Instructions (Addendum)
Stop Nexium for now. Start taking Dexilant each morning with breakfast. We have given you samples to see if this makes any difference with your smell and taste.   I have ordered a breath test to see if you have bacterial overgrowth.   We have also referred you to another Ear, Nose, Throat Specialist for a second opinion.  We will be in touch with these results shortly!

## 2012-12-14 NOTE — ED Notes (Signed)
This RN went to room to d/c pt.  Pt is not in room.  Pt's gown on bed and pt's belongings are gone.  Room cleaned.  D/C papers at desk in case pt comes back for them.

## 2012-12-14 NOTE — ED Notes (Signed)
Pt states that she has a smell that is undescribable that she thinks is herself rotting away from the inside.  Pt states first smell appeared 4 months prior and has had multiple work-ups without any information as to what is causing smell.

## 2012-12-15 ENCOUNTER — Encounter: Payer: Self-pay | Admitting: Gastroenterology

## 2012-12-15 ENCOUNTER — Encounter: Payer: Self-pay | Admitting: General Practice

## 2012-12-15 ENCOUNTER — Telehealth: Payer: Self-pay | Admitting: Gastroenterology

## 2012-12-15 ENCOUNTER — Telehealth: Payer: Self-pay | Admitting: Internal Medicine

## 2012-12-15 DIAGNOSIS — R432 Parageusia: Secondary | ICD-10-CM | POA: Insufficient documentation

## 2012-12-15 NOTE — Telephone Encounter (Signed)
Pt came by front window this morning and wanted me to give AS some papers that she brought. She said that she had eaten a lot of ice cream on Sunday and said that she went to ER. She wants to set up her breathing test, but wants [place] to cancel referral to see an ENT. Please advise and call patient at [phone]

## 2012-12-15 NOTE — Telephone Encounter (Signed)
I received a handwritten note from patient reporting she had researched the hydrogen breath test and feels this is her issue. Notes eating large quantities of [employer] ice cream during a time of depression, then noticed the smell.   I attempted to call her and had to leave a message. Will cancel ENT referral and proceed with HBT for now. Avoid dairy.

## 2012-12-15 NOTE — Telephone Encounter (Signed)
Need last TCS reports from [facility]. I believe Dr. Lovell Sheehan performed in 2007; can we see if we can locate this? Thanks!

## 2012-12-15 NOTE — Assessment & Plan Note (Signed)
Persistent with thus far negative work-up to include EGD, CT abd/pelvis, MRI of brain. I have asked patient to stop Nexium and trial Dexilant. Doubt Nexium is the culprit, but I have asked her to give this a try. In the meantime, set up for HBT. Referral to ENT, second opinion.

## 2012-12-15 NOTE — Telephone Encounter (Signed)
We will not send referral to ENT.

## 2012-12-15 NOTE — Telephone Encounter (Signed)
LMOM FOR PT TO CALL BACK

## 2012-12-15 NOTE — Assessment & Plan Note (Signed)
Last colonoscopy possibly in 2007 by Dr. Lovell Sheehan. Need op notes. No lower GI symptoms.

## 2012-12-15 NOTE — Telephone Encounter (Signed)
Pt is scheduled for 12/18/12@7 :30am. Pt is aware of appt

## 2012-12-16 ENCOUNTER — Telehealth: Payer: Self-pay

## 2012-12-16 MED ORDER — AMOXICILLIN-POT CLAVULANATE ER 1000-62.5 MG PO TB12: 1.0000 | ORAL_TABLET | Freq: Two times a day (BID) | ORAL | Status: DC

## 2012-12-16 NOTE — Telephone Encounter (Signed)
Requested Records.  

## 2012-12-16 NOTE — Telephone Encounter (Signed)
I called and lm on [name]'s machine about cancelling her test.

## 2012-12-16 NOTE — Telephone Encounter (Signed)
Patient has very interesting subjective symptoms to include smelling foul odor (formaldehyde), feeling like she is "rotting" from the inside, and has previously reported a metallic taste. She is quite anxious. I was at the hospital this morning when patient showed up at the office. I have discussed with our office manager two options for patient: 1. Wait and proceed with HBT as scheduled, which is only a few days away. This will help rule out any final GI causes. 2. Empirically treat for bacterial overgrowth now, which exposes her to antibiotics and delays any future HBT for at least several weeks.   I do not feel this is a bacterial overgrowth scenario; however, due to patients obvious distress and decreased quality of life, I have offered her the empiric trial of a BRIEF course of antibiotics. She is well aware this may not be the culprit. From a GI standpoint, she has been extensively evaluated. I am concerned about an underlying psychiatric condition at this point. I have offered her a second opinion with an ENT specialist, which I feel is appropriate due to her symptoms. It must be noted, she has seen ENT already but did not have a good rapport with the provider. She does not want to pursue seeing another ENT specialist. Her last colonoscopy was in 2007, and we have requested records. This should not have any bearing on current condition.  Will treat with Augmentin 30mg /kg/day X 7 days. I have sent this to the pharmacy.  Let's call patient tomorrow to find out how she is doing.

## 2012-12-16 NOTE — Progress Notes (Signed)
CC'd to PCP 

## 2012-12-16 NOTE — Telephone Encounter (Signed)
Pt came by office- she was very nervous and anxious. She is c/o being full of gas and she could feel it and smell it coming out of her mouth, nose and eyes. Pt is worried that she will "explode" because of the gas buildup. She stated she had a bacteria overgrowth and needed the HBT done today instead of Friday. Camille called endo to see if HBT could be done today and they are already performing one now and will be unable to do it today, she spoke with AS and per AS we can call in abx for her but she would not be able to have the testing done while on abx. Camille and I explained this to the pt and she wants the abx called in now and cancel the HBT.

## 2012-12-16 NOTE — Addendum Note (Signed)
Addended by: Nira Retort on: 12/16/2012 09:02 AM   Modules accepted: Orders

## 2012-12-18 ENCOUNTER — Ambulatory Visit (HOSPITAL_COMMUNITY): Admission: RE | Admit: 2012-12-18 | Payer: Medicare Other | Source: Ambulatory Visit | Admitting: Internal Medicine

## 2012-12-18 ENCOUNTER — Encounter (HOSPITAL_COMMUNITY): Admission: RE | Payer: Self-pay | Source: Ambulatory Visit

## 2012-12-18 SURGERY — BREATH TEST, FOR INTESTINAL BACTERIAL OVERGROWTH

## 2012-12-22 ENCOUNTER — Telehealth: Payer: Self-pay | Admitting: Internal Medicine

## 2012-12-22 NOTE — Telephone Encounter (Signed)
See other phone note on this pt.  

## 2012-12-22 NOTE — Telephone Encounter (Signed)
Pt called today asking for another refill of her antibiotic she has been taking. She feels that she needs another round of it. [facility] pharmacy.

## 2012-12-22 NOTE — Telephone Encounter (Signed)
Spoke with pt- she feels like the "gas" in her stomach was getting a little better until she ate some blue cheese dressing on Sunday and now its just as bad as it was before. She can smell the gas and taste it. She said she wants to try another round of the abx. I advised her to stay away from dairy completely. She said she understood that now.

## 2012-12-24 NOTE — Telephone Encounter (Signed)
I need to discuss with Dr. Jena Gauss further before before additional recommendations. Holding off on repeat abx right now; we want to limit overuse of abx.

## 2012-12-27 ENCOUNTER — Encounter: Payer: Self-pay | Admitting: Gastroenterology

## 2012-12-29 ENCOUNTER — Encounter: Payer: Self-pay | Admitting: Gastroenterology

## 2012-12-29 NOTE — Telephone Encounter (Signed)
Discussed with Dr. Jena Gauss. We need to set up for a HBT to rule out definitively if she is dealing with bacterial overgrowth. No further abx right now.   If this is negative, refer to [facility] for further evaluation. I am sending a note to patient on my chart to let her know this. HBT will need to be scheduled at least 2 weeks away, as she was recently on antibiotics.

## 2012-12-30 ENCOUNTER — Encounter (HOSPITAL_COMMUNITY): Payer: Self-pay | Admitting: Pharmacy Technician

## 2012-12-30 ENCOUNTER — Other Ambulatory Visit: Payer: Self-pay | Admitting: Gastroenterology

## 2012-12-30 DIAGNOSIS — Z23 Encounter for immunization: Secondary | ICD-10-CM | POA: Diagnosis not present

## 2012-12-30 NOTE — Telephone Encounter (Signed)
Patient is scheduled for HBT on Thursday Oct 30 th and she is aware

## 2013-01-07 ENCOUNTER — Encounter (HOSPITAL_COMMUNITY)
Admission: RE | Disposition: A | Discharge status: Home / Self Care | Payer: Self-pay | Source: Ambulatory Visit | Attending: Internal Medicine

## 2013-01-07 ENCOUNTER — Ambulatory Visit (HOSPITAL_COMMUNITY)
Admission: RE | Admit: 2013-01-07 | Discharge: 2013-01-07 | Disposition: A | Discharge status: Home / Self Care | Payer: Medicare Other | Source: Ambulatory Visit | Attending: Internal Medicine | Admitting: Internal Medicine

## 2013-01-07 DIAGNOSIS — R142 Eructation: Secondary | ICD-10-CM | POA: Insufficient documentation

## 2013-01-07 DIAGNOSIS — R141 Gas pain: Secondary | ICD-10-CM | POA: Diagnosis not present

## 2013-01-07 HISTORY — PX: BACTERIAL OVERGROWTH TEST: SHX5739

## 2013-01-07 SURGERY — BREATH TEST, FOR INTESTINAL BACTERIAL OVERGROWTH

## 2013-01-07 MED ORDER — LACTULOSE 10 GM/15ML PO SOLN
ORAL | Status: AC
  Filled 2013-01-07: qty 60

## 2013-01-07 MED ORDER — LACTULOSE 10 GM/15ML PO SOLN
25.0000 g | Freq: Once | ORAL | Status: AC
  Administered 2013-01-07: 25 g via ORAL

## 2013-01-07 NOTE — Progress Notes (Addendum)
No beans, bran or high fiber cereal the day before the procedure? no NPO except for water 12 hours before procedure? yes No smoking, sleeping or vigorous exercising for at least 30 before procedure? no Recent antibiotic use and/or diarrhea? no   If yes, physician notified.  Time Baseline 15 mins 30 mins 45 mins 60 mins 75 mins 90 mins 105 mins 120 mins 135 mins 150 mins 165 mins 180 mins  H2-ppm 0  0  2  5  12  24  31   37  31   41 32  48  37     Results noted. Test positive due to absolute increase by greater than 20 ppm above baseline at 90 minutes. Full operative note in epic.

## 2013-01-08 ENCOUNTER — Encounter (HOSPITAL_COMMUNITY): Payer: Self-pay | Admitting: Internal Medicine

## 2013-01-10 ENCOUNTER — Encounter: Payer: Self-pay | Admitting: Gastroenterology

## 2013-01-11 ENCOUNTER — Encounter: Payer: Self-pay | Admitting: Gastroenterology

## 2013-01-12 ENCOUNTER — Other Ambulatory Visit: Payer: Self-pay | Admitting: Gastroenterology

## 2013-01-12 MED ORDER — METRONIDAZOLE 500 MG PO TABS: 500.0000 mg | ORAL_TABLET | Freq: Three times a day (TID) | ORAL | Status: DC

## 2013-01-12 MED ORDER — SULFAMETHOXAZOLE-TMP DS 800-160 MG PO TABS: 1.0000 | ORAL_TABLET | Freq: Every day | ORAL | Status: DC

## 2013-01-13 DIAGNOSIS — R933 Abnormal findings on diagnostic imaging of other parts of digestive tract: Secondary | ICD-10-CM | POA: Diagnosis not present

## 2013-01-13 DIAGNOSIS — R6889 Other general symptoms and signs: Secondary | ICD-10-CM | POA: Diagnosis not present

## 2013-01-13 NOTE — Procedures (Signed)
Name: [name]  Pre-op Diagnosis: Persistent foul odor subjectively Post-op Diagnosis: Probably small bowel bacterial overgrowth  Procedure: Hydrogen Breath Test  Interpreted by: Nira Retort, NP  Medications Used: Lactulose  Findings: At 0 minutes (0ppm) At 90 minutes (31 ppm) At 105 min (37ppm) At 135 min (41ppm) AT 165 min (48ppm)  Positive breath test based on absolute increase of at least 20 ppm above baseline by 90 minutes, which is evidenced in this study.   Diagnosis:  Probable small bowel bacterial overgrowth.   Plan: Previously treated with short-course of Augmentin empirically with mild improvement. Trial of Flagyl 500 mg po TID and Bactrim 800 mg daily, sent to pharmacy. If persistent symptoms despite this course of treatment, referral to to tertiary center will be recommended. Strict adherence to lactose-free diet discussed with patient.   Nira Retort, ANP-BC [facility]  12:14 PM

## 2013-01-14 ENCOUNTER — Other Ambulatory Visit: Payer: Self-pay

## 2013-01-20 ENCOUNTER — Encounter: Payer: Self-pay | Admitting: Gastroenterology

## 2013-01-25 ENCOUNTER — Encounter: Payer: Self-pay | Admitting: Gastroenterology

## 2013-01-25 ENCOUNTER — Telehealth: Payer: Self-pay | Admitting: Gastroenterology

## 2013-01-25 NOTE — Telephone Encounter (Signed)
Referral has been made to [facility] with Dr. Merri Brunette on Monday Jan 26th 2015 at 10:45 and Ms. Kamer is aware

## 2013-01-25 NOTE — Telephone Encounter (Signed)
Thank you :)

## 2013-01-25 NOTE — Telephone Encounter (Signed)
Please refer to [facility] as soon as possible due to persistent "foul odor", belching that patient is experiencing. We have done an exhaustive work-up here. She has been treated for bacterial overgrowth twice without much improvement.   Thanks!

## 2013-01-26 ENCOUNTER — Encounter: Payer: Self-pay | Admitting: Gastroenterology

## 2013-01-26 NOTE — Progress Notes (Signed)
Last colonoscopy by Dr. Lovell Sheehan in 2007. Needs routine screening in 2017. Darl Pikes, please nic.

## 2013-01-27 NOTE — Progress Notes (Signed)
Reminder in epic °

## 2013-01-28 ENCOUNTER — Encounter: Payer: Self-pay | Admitting: Gastroenterology

## 2013-02-01 ENCOUNTER — Encounter: Payer: Self-pay | Admitting: Gastroenterology

## 2013-02-02 ENCOUNTER — Encounter: Payer: Self-pay | Admitting: Gastroenterology

## 2013-02-09 ENCOUNTER — Encounter: Payer: Self-pay | Admitting: Gastroenterology

## 2013-02-09 ENCOUNTER — Ambulatory Visit (HOSPITAL_COMMUNITY)
Admission: RE | Admit: 2013-02-09 | Discharge: 2013-02-09 | Disposition: A | Discharge status: Home / Self Care | Payer: Medicare Other | Source: Ambulatory Visit | Attending: Gastroenterology | Admitting: Gastroenterology

## 2013-02-09 ENCOUNTER — Ambulatory Visit (INDEPENDENT_AMBULATORY_CARE_PROVIDER_SITE_OTHER): Payer: Medicare Other | Admitting: Gastroenterology

## 2013-02-09 VITALS — BP 139/78 | HR 77 | Temp 97.5°F | Wt 178.6 lb

## 2013-02-09 DIAGNOSIS — R439 Unspecified disturbances of smell and taste: Secondary | ICD-10-CM | POA: Diagnosis not present

## 2013-02-09 DIAGNOSIS — R918 Other nonspecific abnormal finding of lung field: Secondary | ICD-10-CM | POA: Diagnosis not present

## 2013-02-09 DIAGNOSIS — R059 Cough, unspecified: Secondary | ICD-10-CM | POA: Diagnosis not present

## 2013-02-09 DIAGNOSIS — R109 Unspecified abdominal pain: Secondary | ICD-10-CM | POA: Diagnosis not present

## 2013-02-09 DIAGNOSIS — R05 Cough: Secondary | ICD-10-CM | POA: Insufficient documentation

## 2013-02-09 DIAGNOSIS — R431 Parosmia: Secondary | ICD-10-CM

## 2013-02-09 DIAGNOSIS — J40 Bronchitis, not specified as acute or chronic: Secondary | ICD-10-CM | POA: Diagnosis not present

## 2013-02-09 NOTE — Patient Instructions (Signed)
Please have chest xray done today.   We have provided orders for blood work today as well.   We do not have any other samples of probiotics, but you can get Align, Restora, Digestive Advantage, Phillip's Colon Health, or [employer] brand probiotic over the counter.   Further recommendations to follow!

## 2013-02-09 NOTE — Progress Notes (Signed)
Referring Provider: Carylon Perches, MD Primary Care Physician:  Carylon Perches, MD Primary GI: Dr. Jena Gauss   Chief Complaint  Patient presents with  . Follow-up    HPI:   [name] returns today in follow-up for an interesting presentation of persistent bitter smell and taste. She has had an extensive evaluation to include upper endoscopy, CT abd/pelvis to rule out occult malignancy, MRI of head, CT of sinuses, all normal. HBT was positive for bacterial overgrowth. She has been treated with  Bitter smell, taste. Sometimes smell is so bad it burns her nose. Avoiding dairy. Gluten-free. Intermittent upper abdominal discomfort, wraps around back. Worried about gases building up. Belches a lot. When food starts to digest, feels like it is rolling and rolling. Feels like difficulty digesting. Pain sometimes associated with eating. Taking acidophilus, and thinks it smells worse when she is taking the probiotic. Feels like has to cough to get the gases out of her lungs. Chronic cough. States she believes she has cracks in her gut that are allowing bacteria to get into her blood stream, which then gets into her lungs. She has been treated empirically with Augmentin even prior to HBT; after HBT she was treated with a course of Flagyl and Bactrim. She noted mild improvement but still persistent symptoms.   [facility] appt Jan 26th.   Past Medical History  Diagnosis Date  . GERD (gastroesophageal reflux disease)   . Hypercholesteremia   . Hypertension   . Bacterial overgrowth syndrome     Positive HBT    Past Surgical History  Procedure Laterality Date  . Tubal ligation    . Bladder tack      x 2  . Knee arthroscopy      Bilateral (torn meniscus)  . Rotator cuff repair    . Esophagogastroduodenoscopy (egd) with esophageal dilation  Dec 2011    Dr. Jena Gauss: non-critical Schatzki's ring s/p dilation with 56 F dilator, small hiatal hernia, otherwise normal  . Abdominal hysterectomy    .  Esophagogastroduodenoscopy N/A 10/12/2012    RMR: Small inlet patch.  Small hiatal hernia.  Status post gastric biopsy, negative H.pylori  . Bacterial overgrowth test N/A 01/07/2013    Procedure: BACTERIAL OVERGROWTH TEST;  Surgeon: Corbin Ade, MD;  Location: [facility];  Service: Endoscopy;  Laterality: N/A;  7:30  . Colonoscopy  July 2007    Dr. Lovell Sheehan: normal. Due for screening 2017    Current Outpatient Prescriptions  Medication Sig Dispense Refill  . acidophilus (RISAQUAD) CAPS Take 1 capsule by mouth daily.      Marland Kitchen NEXIUM 40 MG capsule Take 40 mg by mouth daily at 12 noon.        No current facility-administered medications for this visit.    Allergies as of 02/09/2013  . (No Known Allergies)    Family History  Problem Relation Age of Onset  . Colon cancer Neg Hx     History   Social History  . Marital Status: Divorced    Spouse Name: N/A    Number of Children: N/A  . Years of Education: N/A   Occupational History  . retired     [employer]   Social History Main Topics  . Smoking status: Never Smoker   . Smokeless tobacco: None  . Alcohol Use: No  . Drug Use: No  . Sexual Activity: None   Other Topics Concern  . None   Social History Narrative  . None    Review of Systems:  Negative unless mentioned in HPI.   Physical Exam: BP 139/78  Pulse 77  Temp(Src) 97.5 F (36.4 C) (Oral)  Wt 178 lb 9.6 oz (81.012 kg) General:   Alert and oriented. Somewhat anxious  Head:  Normocephalic and atraumatic. Eyes:  Conjuctiva clear without scleral icterus. Mouth:  Oral mucosa pink and moist. Good dentition. No lesions. Heart:  S1, S2 present without murmurs, rubs, or gallops. Regular rate and rhythm. Abdomen:  +BS, soft, non-tender and non-distended. No rebound or guarding. No HSM or masses noted. Msk:  Symmetrical without gross deformities. Normal posture. Extremities:  Without edema. Neurologic:  Alert and  oriented x4;  grossly normal  neurologically. Skin:  Intact without significant lesions or rashes. Psych:  Alert and cooperative. Normal mood and affect.

## 2013-02-10 ENCOUNTER — Encounter: Payer: Self-pay | Admitting: Gastroenterology

## 2013-02-10 LAB — IGA: IgA: 274 mg/dL (ref 69–380)

## 2013-02-10 LAB — TISSUE TRANSGLUTAMINASE, IGA: Tissue Transglutaminase Ab, IgA: 4.5 U/mL

## 2013-02-11 ENCOUNTER — Encounter: Payer: Self-pay | Admitting: Gastroenterology

## 2013-02-11 DIAGNOSIS — R05 Cough: Secondary | ICD-10-CM | POA: Insufficient documentation

## 2013-02-11 DIAGNOSIS — R109 Unspecified abdominal pain: Secondary | ICD-10-CM | POA: Insufficient documentation

## 2013-02-11 DIAGNOSIS — R059 Cough, unspecified: Secondary | ICD-10-CM | POA: Insufficient documentation

## 2013-02-11 DIAGNOSIS — R431 Parosmia: Secondary | ICD-10-CM | POA: Insufficient documentation

## 2013-02-11 NOTE — Assessment & Plan Note (Signed)
Per patient request, proceed with CXR. Patient convinced that her gut is "leaking" and getting into blood stream, which then causes the smells. Question a psychological component. CXR, with further follow-up of cough with PCP.

## 2013-02-11 NOTE — Assessment & Plan Note (Signed)
Vague upper abdominal band-like pain, sometimes worsened with eating. EGD negative. Gallbladder remains in situ. Treated with 2 rounds of abx for probable small bowel bacterial overgrowth without much improvement. Doubt SBBO is the culprit here; she has had extensive evaluation thus far. Check celiac serologies for completeness' sake. Consider [place] of abdomen +/-HIDA scan. Keep appt with [facility] in Jan.

## 2013-02-11 NOTE — Assessment & Plan Note (Signed)
With extensive work-up thus far. Change probiotic. Keep appt with [facility]. No further abx as benefits do not outweigh risks at this time.

## 2013-02-15 ENCOUNTER — Telehealth: Payer: Self-pay | Admitting: Gastroenterology

## 2013-02-15 DIAGNOSIS — R109 Unspecified abdominal pain: Secondary | ICD-10-CM

## 2013-02-15 NOTE — Telephone Encounter (Signed)
Patient is scheduled for Wednesday Dec 10th @ 8:00 am and [name] is aware

## 2013-02-15 NOTE — Progress Notes (Signed)
cc'd to pcp 

## 2013-02-15 NOTE — Telephone Encounter (Signed)
I have placed orders for an ultrasound of abdomen for patient.   Please arrange for [place] of abdomen for patient; she is aware of the plan, just needs date and time to be there! Thanks!

## 2013-02-16 ENCOUNTER — Encounter: Payer: Self-pay | Admitting: Internal Medicine

## 2013-02-17 ENCOUNTER — Ambulatory Visit (HOSPITAL_COMMUNITY)
Admission: RE | Admit: 2013-02-17 | Discharge: 2013-02-17 | Disposition: A | Discharge status: Home / Self Care | Payer: Medicare Other | Source: Ambulatory Visit | Attending: Gastroenterology | Admitting: Gastroenterology

## 2013-02-17 ENCOUNTER — Encounter: Payer: Self-pay | Admitting: Gastroenterology

## 2013-02-17 ENCOUNTER — Other Ambulatory Visit: Payer: Self-pay | Admitting: Gastroenterology

## 2013-02-17 DIAGNOSIS — R109 Unspecified abdominal pain: Secondary | ICD-10-CM | POA: Diagnosis not present

## 2013-02-18 ENCOUNTER — Telehealth: Payer: Self-pay | Admitting: Gastroenterology

## 2013-02-18 ENCOUNTER — Other Ambulatory Visit: Payer: Self-pay | Admitting: Gastroenterology

## 2013-02-18 DIAGNOSIS — G8929 Other chronic pain: Secondary | ICD-10-CM

## 2013-02-18 NOTE — Telephone Encounter (Signed)
HIDA scan scheduled for Tuesday Dec 16th at 8:00 am and she is aware

## 2013-02-18 NOTE — Telephone Encounter (Signed)
Please set up HIDA scan to evaluate for biliary dyskinesia. Thanks!

## 2013-02-21 ENCOUNTER — Encounter: Payer: Self-pay | Admitting: Gastroenterology

## 2013-02-23 ENCOUNTER — Encounter (HOSPITAL_COMMUNITY)
Admission: RE | Admit: 2013-02-23 | Discharge: 2013-02-23 | Disposition: A | Discharge status: Home / Self Care | Payer: Medicare Other | Source: Ambulatory Visit | Attending: Gastroenterology | Admitting: Gastroenterology

## 2013-02-23 ENCOUNTER — Telehealth: Payer: Self-pay | Admitting: Gastroenterology

## 2013-02-23 ENCOUNTER — Encounter (HOSPITAL_COMMUNITY): Payer: Self-pay

## 2013-02-23 DIAGNOSIS — R1011 Right upper quadrant pain: Secondary | ICD-10-CM | POA: Insufficient documentation

## 2013-02-23 DIAGNOSIS — G8929 Other chronic pain: Secondary | ICD-10-CM | POA: Insufficient documentation

## 2013-02-23 DIAGNOSIS — R109 Unspecified abdominal pain: Secondary | ICD-10-CM | POA: Diagnosis not present

## 2013-02-23 MED ORDER — TECHNETIUM TC 99M MEBROFENIN IV KIT
5.0000 | PACK | Freq: Once | INTRAVENOUS | Status: AC | PRN
  Administered 2013-02-23: 5 via INTRAVENOUS

## 2013-02-23 MED ORDER — SINCALIDE 5 MCG IJ SOLR: 0.0200 ug/kg | Freq: Once | INTRAMUSCULAR | Status: DC

## 2013-02-23 NOTE — Telephone Encounter (Signed)
Patient wrote in MyChart that she was having multiple loose stools. Please have her complete a Cdiff sample.

## 2013-02-24 ENCOUNTER — Other Ambulatory Visit: Payer: Self-pay

## 2013-02-24 ENCOUNTER — Other Ambulatory Visit: Payer: Self-pay | Admitting: Gastroenterology

## 2013-02-24 DIAGNOSIS — R197 Diarrhea, unspecified: Secondary | ICD-10-CM

## 2013-02-24 NOTE — Telephone Encounter (Signed)
Lab order done and faxed to lab. Pt aware.

## 2013-02-25 ENCOUNTER — Encounter: Payer: Self-pay | Admitting: Gastroenterology

## 2013-02-25 ENCOUNTER — Telehealth: Payer: Self-pay

## 2013-02-25 LAB — CLOSTRIDIUM DIFFICILE BY PCR: Toxigenic C. Difficile by PCR: DETECTED — CR

## 2013-02-25 MED ORDER — METRONIDAZOLE 500 MG PO TABS: 500.0000 mg | ORAL_TABLET | Freq: Three times a day (TID) | ORAL | Status: DC

## 2013-02-25 NOTE — Telephone Encounter (Signed)
I have sent Flagyl 500 mg orally TID to pharmacy. Take for 14 days.   Please have her continue her probiotic daily. Stop Nexium for now until done with treatment for Cdiff.  Please review proper handwashing, cleaning, sanitizing.   Keep appt with [facility].

## 2013-02-25 NOTE — Telephone Encounter (Signed)
Sent pt message in my chart.

## 2013-02-25 NOTE — Telephone Encounter (Signed)
[name] from the lab called with a positive C-Diff.

## 2013-03-08 ENCOUNTER — Encounter: Payer: Self-pay | Admitting: Gastroenterology

## 2013-03-30 ENCOUNTER — Encounter: Payer: Self-pay | Admitting: Gastroenterology

## 2013-04-01 ENCOUNTER — Telehealth: Payer: Self-pay | Admitting: Gastroenterology

## 2013-04-01 NOTE — Telephone Encounter (Signed)
Her records are sent at the time of the referral

## 2013-04-01 NOTE — Telephone Encounter (Signed)
Soledad GerlachLeigh Ann:  Ms. Marchia BondCoe asked that we make sure all of her records are faxed to "[facility]". [facility] at [facility]? She has an appt on the 26th. I just want to make sure all of my office notes, procedure notes, and any imaging gets sent to them. I'm cc'ing Chelsey on this in case that would be her area. I think we already have sent these things?

## 2013-04-05 DIAGNOSIS — R7989 Other specified abnormal findings of blood chemistry: Secondary | ICD-10-CM | POA: Diagnosis not present

## 2013-04-05 DIAGNOSIS — K769 Liver disease, unspecified: Secondary | ICD-10-CM | POA: Diagnosis not present

## 2013-04-05 DIAGNOSIS — K6389 Other specified diseases of intestine: Secondary | ICD-10-CM | POA: Insufficient documentation

## 2013-04-28 ENCOUNTER — Encounter: Payer: Self-pay | Admitting: Gastroenterology

## 2013-04-28 NOTE — Progress Notes (Signed)
[facility] by Dr. Merri BrunetteFina. HBT ordered again. Will be seen again in March 2015.

## 2013-05-12 DIAGNOSIS — R143 Flatulence: Secondary | ICD-10-CM | POA: Diagnosis not present

## 2013-05-12 DIAGNOSIS — K6389 Other specified diseases of intestine: Secondary | ICD-10-CM | POA: Diagnosis not present

## 2013-05-12 DIAGNOSIS — A049 Bacterial intestinal infection, unspecified: Secondary | ICD-10-CM | POA: Diagnosis not present

## 2013-05-12 DIAGNOSIS — R141 Gas pain: Secondary | ICD-10-CM | POA: Diagnosis not present

## 2013-06-15 DIAGNOSIS — R7989 Other specified abnormal findings of blood chemistry: Secondary | ICD-10-CM | POA: Diagnosis not present

## 2013-06-15 DIAGNOSIS — I10 Essential (primary) hypertension: Secondary | ICD-10-CM | POA: Diagnosis not present

## 2013-06-15 DIAGNOSIS — K219 Gastro-esophageal reflux disease without esophagitis: Secondary | ICD-10-CM | POA: Diagnosis not present

## 2013-06-15 DIAGNOSIS — Z8719 Personal history of other diseases of the digestive system: Secondary | ICD-10-CM | POA: Diagnosis not present

## 2013-06-29 DIAGNOSIS — R439 Unspecified disturbances of smell and taste: Secondary | ICD-10-CM | POA: Diagnosis not present

## 2013-07-31 DIAGNOSIS — G529 Cranial nerve disorder, unspecified: Secondary | ICD-10-CM | POA: Diagnosis not present

## 2013-08-11 DIAGNOSIS — R439 Unspecified disturbances of smell and taste: Secondary | ICD-10-CM | POA: Diagnosis not present

## 2013-10-12 DIAGNOSIS — E785 Hyperlipidemia, unspecified: Secondary | ICD-10-CM | POA: Diagnosis not present

## 2013-10-12 DIAGNOSIS — Z79899 Other long term (current) drug therapy: Secondary | ICD-10-CM | POA: Diagnosis not present

## 2013-10-18 DIAGNOSIS — E785 Hyperlipidemia, unspecified: Secondary | ICD-10-CM | POA: Diagnosis not present

## 2013-10-18 DIAGNOSIS — I10 Essential (primary) hypertension: Secondary | ICD-10-CM | POA: Diagnosis not present

## 2013-12-29 DIAGNOSIS — Z23 Encounter for immunization: Secondary | ICD-10-CM | POA: Diagnosis not present

## 2014-01-21 DIAGNOSIS — E785 Hyperlipidemia, unspecified: Secondary | ICD-10-CM | POA: Diagnosis not present

## 2014-01-27 DIAGNOSIS — I10 Essential (primary) hypertension: Secondary | ICD-10-CM | POA: Diagnosis not present

## 2014-01-27 DIAGNOSIS — E785 Hyperlipidemia, unspecified: Secondary | ICD-10-CM | POA: Diagnosis not present

## 2015-05-02 ENCOUNTER — Encounter (HOSPITAL_COMMUNITY): Payer: Self-pay

## 2015-05-02 ENCOUNTER — Emergency Department (HOSPITAL_COMMUNITY): Payer: Medicare Other

## 2015-05-02 ENCOUNTER — Emergency Department (HOSPITAL_COMMUNITY)
Admission: EM | Admit: 2015-05-02 | Discharge: 2015-05-02 | Disposition: A | Discharge status: Home / Self Care | Payer: Medicare Other | Attending: Emergency Medicine | Admitting: Emergency Medicine

## 2015-05-02 DIAGNOSIS — Z7982 Long term (current) use of aspirin: Secondary | ICD-10-CM | POA: Diagnosis not present

## 2015-05-02 DIAGNOSIS — E7800 Pure hypercholesterolemia, unspecified: Secondary | ICD-10-CM | POA: Diagnosis not present

## 2015-05-02 DIAGNOSIS — I10 Essential (primary) hypertension: Secondary | ICD-10-CM | POA: Diagnosis not present

## 2015-05-02 DIAGNOSIS — Z8719 Personal history of other diseases of the digestive system: Secondary | ICD-10-CM | POA: Diagnosis not present

## 2015-05-02 DIAGNOSIS — Y9389 Activity, other specified: Secondary | ICD-10-CM | POA: Diagnosis not present

## 2015-05-02 DIAGNOSIS — Z79899 Other long term (current) drug therapy: Secondary | ICD-10-CM | POA: Diagnosis not present

## 2015-05-02 DIAGNOSIS — S52502A Unspecified fracture of the lower end of left radius, initial encounter for closed fracture: Secondary | ICD-10-CM | POA: Diagnosis not present

## 2015-05-02 DIAGNOSIS — W108XXA Fall (on) (from) other stairs and steps, initial encounter: Secondary | ICD-10-CM | POA: Insufficient documentation

## 2015-05-02 DIAGNOSIS — S6992XA Unspecified injury of left wrist, hand and finger(s), initial encounter: Secondary | ICD-10-CM | POA: Diagnosis present

## 2015-05-02 DIAGNOSIS — Y998 Other external cause status: Secondary | ICD-10-CM | POA: Insufficient documentation

## 2015-05-02 DIAGNOSIS — S52592A Other fractures of lower end of left radius, initial encounter for closed fracture: Secondary | ICD-10-CM | POA: Insufficient documentation

## 2015-05-02 DIAGNOSIS — Y9289 Other specified places as the place of occurrence of the external cause: Secondary | ICD-10-CM | POA: Diagnosis not present

## 2015-05-02 MED ORDER — HYDROCODONE-ACETAMINOPHEN 5-325 MG PO TABS: 0.5000 | ORAL_TABLET | ORAL | Status: DC | PRN

## 2015-05-02 NOTE — ED Provider Notes (Signed)
CSN: [id number]     Arrival date & time 05/02/15  1033 History   First MD Initiated Contact with Patient 05/02/15 1115     Chief Complaint  Patient presents with  . Wrist Pain     (Consider location/radiation/quality/duration/timing/severity/associated sxs/prior Treatment) The history is provided by the patient.   [name] is a 70 y.o. female presenting with a painful left wrist after falling and landing with the outstretched left hand behind her just prior to arrival.  She had stepped up her one step front porch, recognized she did not have a good perch with the foot and fell backward onto pavement.  She denies other pain including back, neck , buttock, head or neck.  She did not hit her head during the fall. She had no treatment prior to arrival here.  She denies radiation of pain and denies numbness in her hand or fingers.    Past Medical History  Diagnosis Date  . GERD (gastroesophageal reflux disease)   . Hypercholesteremia   . Hypertension   . Bacterial overgrowth syndrome     Positive HBT   Past Surgical History  Procedure Laterality Date  . Tubal ligation    . Bladder tack      x 2  . Knee arthroscopy      Bilateral (torn meniscus)  . Rotator cuff repair    . Esophagogastroduodenoscopy (egd) with esophageal dilation  Dec 2011    Dr. Jena Gauss: non-critical Schatzki's ring s/p dilation with 56 F dilator, small hiatal hernia, otherwise normal  . Abdominal hysterectomy    . Esophagogastroduodenoscopy N/A 10/12/2012    RMR: Small inlet patch.  Small hiatal hernia.  Status post gastric biopsy, negative H.pylori  . Bacterial overgrowth test N/A 01/07/2013    Procedure: BACTERIAL OVERGROWTH TEST;  Surgeon: Corbin Ade, MD;  Location: [facility];  Service: Endoscopy;  Laterality: N/A;  7:30  . Colonoscopy  July 2007    Dr. Lovell Sheehan: normal. Due for screening 2017   Family History  Problem Relation Age of Onset  . Colon cancer Neg Hx    Social History  Substance Use  Topics  . Smoking status: Never Smoker   . Smokeless tobacco: None  . Alcohol Use: No   OB History    No data available     Review of Systems  Constitutional: Negative for fever.  Musculoskeletal: Positive for joint swelling and arthralgias. Negative for myalgias.  Neurological: Negative for weakness and numbness.      Allergies  Review of patient's allergies indicates no known allergies.  Home Medications   Prior to Admission medications   Medication Sig Start Date End Date Taking? Authorizing Provider  amLODipine (NORVASC) 5 MG tablet Take 1 tablet by mouth daily. 04/03/15  Yes Historical Provider, MD  aspirin EC 81 MG tablet Take 81 mg by mouth daily.   Yes Historical Provider, MD  atorvastatin (LIPITOR) 20 MG tablet Take 20 mg by mouth at bedtime. 02/07/15  Yes Historical Provider, MD  HYDROcodone-acetaminophen (NORCO/VICODIN) 5-325 MG tablet Take 0.5-1 tablets by mouth every 4 (four) hours as needed. 05/02/15   Burgess Amor, PA-C   BP 114/59 mmHg  Pulse 77  Temp(Src) 98.2 F (36.8 C) (Oral)  Resp 16  Ht  (1.549 m)  Wt 81.647 kg  BMI 34.03 kg/m2  SpO2 100% Physical Exam  Constitutional: She appears well-developed and well-nourished.  HENT:  Head: Atraumatic.  Neck: Normal range of motion.  Cardiovascular:  Pulses equal bilaterally  Musculoskeletal: She exhibits tenderness.       Left wrist: She exhibits bony tenderness and swelling. She exhibits no effusion and no deformity.  TTP across left dorsal wrist with mild edema, no deformity.  Distal sensation intact, less than 2 sec cap refill in fingertips.  She can flex/ext fingers without tenderness.  Proximal forearm, elbow, shoulder and clavicle nontender.  Neurological: She is alert. She has normal strength. She displays normal reflexes. No sensory deficit.  Skin: Skin is warm and dry.  No skin lesions, intact. Early bruise medial wrist.  Psychiatric: She has a normal mood and affect.    ED Course   Procedures (including critical care time) Labs Review Labs Reviewed - No data to display  Imaging Review Dg Wrist Complete Left  05/02/2015  CLINICAL DATA:  70 year old female who fell backwards onto left hand and wrist with acute pain. Initial encounter. EXAM: LEFT WRIST - COMPLETE 3+ VIEW COMPARISON:  None. FINDINGS: Subtle impacted but otherwise nondisplaced distal left radius fracture suspected with predominantly ventral associated soft tissue swelling about the wrist. No definite radiocarpal joint involvement. The distal left ulna appears intact. Carpal bone alignment within normal limits. Scaphoid appears intact. Visualized left hand appears intact. IMPRESSION: Impacted but otherwise nondisplaced distal left radius fracture with soft tissue swelling. Electronically Signed   By: Odessa Fleming M.D.   On: 05/02/2015 11:05   I have personally reviewed and evaluated these images and lab results as part of my medical decision-making.   EKG Interpretation None      MDM   Final diagnoses:  Distal radius fracture, left, closed, initial encounter    Patient placed in sugar tong, sling provided.  She was given a prescription for hydrocodone 1/2-1 tablet every 4 hours when necessary pain.  Caution regarding sedation.  Ortho  referral given for follow-up care, and  return here for any worsening symptoms.  Patient was seen by Dr. Deretha Emory prior to discharge home.    Burgess Amor, PA-C 05/02/15 2125

## 2015-05-02 NOTE — ED Notes (Signed)
Pt reports she fell going up her steps today.  C/O pain to left wrist.  Swelling noted.  Abrasion  To left forearm.   Radial pulse present. Capillary refill wnl.

## 2015-05-02 NOTE — Discharge Instructions (Signed)
Wrist Fracture °A wrist fracture is a break or crack in one of the bones of your wrist. Your wrist is made up of eight small bones at the palm of your hand (carpal bones) and two long bones that make up your forearm (radius and ulna). °CAUSES °· A direct blow to the wrist. °· Falling on an outstretched hand. °· Trauma, such as a car accident or a fall. °RISK FACTORS °Risk factors for wrist fracture include: °· Participating in contact and high-risk sports, such as skiing, biking, and ice skating. °· Taking steroid medicines. °· Smoking. °· Being female. °· Being Caucasian. °· Drinking more than three alcoholic beverages per day. °· Having low or lowered bone density (osteoporosis or osteopenia). °· Age. Older adults have decreased bone density. °· Women who have had menopause. °· History of previous fractures. °SIGNS AND SYMPTOMS °Symptoms of wrist fractures include tenderness, bruising, and inflammation. Additionally, the wrist may hang in an odd position or appear deformed. °DIAGNOSIS °Diagnosis may include: °· Physical exam. °· X-ray. °TREATMENT °Treatment depends on many factors, including the nature and location of the fracture, your age, and your activity level. Treatment for wrist fracture can be nonsurgical or surgical. °Nonsurgical Treatment °A plaster cast or splint may be applied to your wrist if the bone is in a good position. If the fracture is not in good position, it may be necessary for your health care provider to realign it before applying a splint or cast. Usually, a cast or splint will be worn for several weeks. °Surgical Treatment °Sometimes the position of the bone is so far out of place that surgery is required to apply a device to hold it together as it heals. Depending on the fracture, there are a number of options for holding the bone in place while it heals, such as a cast and metal pins. °HOME CARE INSTRUCTIONS °· Keep your injured wrist elevated and move your fingers as much as  possible. °· Do not put pressure on any part of your cast or splint. It may break. °· Use a plastic bag to protect your cast or splint from water while bathing or showering. Do not lower your cast or splint into water. °· Take medicines only as directed by your health care provider. °· Keep your cast or splint clean and dry. If it becomes wet, damaged, or suddenly feels too tight, contact your health care provider right away. °· Do not use any tobacco products including cigarettes, chewing tobacco, or electronic cigarettes. Tobacco can delay bone healing. If you need help quitting, ask your health care provider. °· Keep all follow-up visits as directed by your health care provider. This is important. °· Ask your health care provider if you should take supplements of calcium and vitamins C and D to promote bone healing. °SEEK MEDICAL CARE IF: °· Your cast or splint is damaged, breaks, or gets wet. °· You have a fever. °· You have chills. °· You have continued severe pain or more swelling than you did before the cast was put on. °SEEK IMMEDIATE MEDICAL CARE IF: °· Your hand or fingernails on the injured arm turn blue or gray, or feel cold or numb. °· You have decreased feeling in the fingers of your injured arm. °MAKE SURE YOU: °· Understand these instructions. °· Will watch your condition. °· Will get help right away if you are not doing well or get worse. °  °This information is not intended to replace advice given to you by your   health care provider. Make sure you discuss any questions you have with your health care provider. °  °Document Released: 12/05/2004 Document Revised: 11/16/2014 Document Reviewed: 03/15/2011 °Elsevier Interactive Patient Education ©2016 [employer] ° °

## 2015-05-02 NOTE — ED Provider Notes (Signed)
Medical screening examination/treatment/procedure(s) were conducted as a shared visit with non-physician practitioner(s) and myself.  I personally evaluated the patient during the encounter.   EKG Interpretation None      Patient seen by me.   Results for orders placed or performed in visit on 02/24/13  Clostridium Difficile by PCR  Result Value Ref Range   Toxigenic C Difficile by pcr Detected (AA) Not Detected   Dg Wrist Complete Left  05/02/2015  CLINICAL DATA:  70 year old female who fell backwards onto left hand and wrist with acute pain. Initial encounter. EXAM: LEFT WRIST - COMPLETE 3+ VIEW COMPARISON:  None. FINDINGS: Subtle impacted but otherwise nondisplaced distal left radius fracture suspected with predominantly ventral associated soft tissue swelling about the wrist. No definite radiocarpal joint involvement. The distal left ulna appears intact. Carpal bone alignment within normal limits. Scaphoid appears intact. Visualized left hand appears intact. IMPRESSION: Impacted but otherwise nondisplaced distal left radius fracture with soft tissue swelling. Electronically Signed   By: Odessa Fleming M.D.   On: 05/02/2015 11:05    Patient status post fall outside going up steps fell backwards and landed on her left wrist arm was outstretched. Pain to the left wrist area and forearm area. X-rays show evidence of an impacted nondisplaced distal radius fracture. Minimal swelling. Neurovascularly intact. Radial pulses 2+. Good cap refill to the fingers. Sensation intact. No elbow or shoulder pain. No other injuries patient did not hit her head no loss of consciousness.   Patient can follow-up with orthopedics locally sugar tong splint will be applied.   Vanetta Mulders, MD 05/02/15 [phone]

## 2015-05-05 ENCOUNTER — Encounter: Payer: Self-pay | Admitting: Orthopedic Surgery

## 2015-05-05 ENCOUNTER — Ambulatory Visit (INDEPENDENT_AMBULATORY_CARE_PROVIDER_SITE_OTHER): Payer: Medicare Other | Admitting: Orthopedic Surgery

## 2015-05-05 VITALS — BP 162/78 | Ht 61.0 in | Wt 180.0 lb

## 2015-05-05 DIAGNOSIS — S52502A Unspecified fracture of the lower end of left radius, initial encounter for closed fracture: Secondary | ICD-10-CM

## 2015-05-05 NOTE — Progress Notes (Signed)
Patient ID: [name], female   DOB: Dec 11, 1945, 70 y.o.   MRN: [id number]  Chief Complaint  Patient presents with  . Wrist Injury    ER FOLLOW UP LEFT WRIST FRACTURE, DOI 05/02/15    HPI [name] is a 70 y.o. female.   25-year-old female presents for evaluation of left wrist fracture  Location of pain left wrist. Quality dull. Severity mild. Duration 3 days. Timing constant. Context fell backwards landing on her left wrist.  Osteoporosis screening has been done and she says she's normal  Review of systems constitutional no fever or chills. Musculoskeletal as stated skin no changes neurologic no tingling or numbness   Review of Systems Review of Systems As above  Past Medical History  Diagnosis Date  . GERD (gastroesophageal reflux disease)   . Hypercholesteremia   . Hypertension   . Bacterial overgrowth syndrome     Positive HBT    Past Surgical History  Procedure Laterality Date  . Tubal ligation    . Bladder tack      x 2  . Knee arthroscopy      Bilateral (torn meniscus)  . Rotator cuff repair    . Esophagogastroduodenoscopy (egd) with esophageal dilation  Dec 2011    Dr. Jena Gauss: non-critical Schatzki's ring s/p dilation with 56 F dilator, small hiatal hernia, otherwise normal  . Abdominal hysterectomy    . Esophagogastroduodenoscopy N/A 10/12/2012    RMR: Small inlet patch.  Small hiatal hernia.  Status post gastric biopsy, negative H.pylori  . Bacterial overgrowth test N/A 01/07/2013    Procedure: BACTERIAL OVERGROWTH TEST;  Surgeon: Corbin Ade, MD;  Location: [facility];  Service: Endoscopy;  Laterality: N/A;  7:30  . Colonoscopy  July 2007    Dr. Lovell Sheehan: normal. Due for screening 2017      Physical Exam 1 Blood pressure 162/78, height  (1.549 m), weight 180 lb (81.647 kg). Physical Exam 2 The patient is well developed well nourished and well groomed. 3 Orientation to person place and time is normal  4 Mood is pleasant.  5 Ambulatory  status normal without assistive device  6 Inspection of the left wrist reveals mild tenderness   mild swelling no deformity 7 Range of motion assessment: The range of motion is diminished primarily secondary to pain 8 Stability tests are deferred because of pain but the x-ray shows no subluxation of the joint 9 Strength assessment muscle tone is normal resistance testing is deferred because of pain and swelling  10 Nerve function normal sensation in the left hand 11 Vascular function normal pulse and perfusion left hand 12 Local lymphatic system epitrochlear lymph nodes normal  Opposite extremity right upper extremity there is no alignment abnormality, no contracture, no subluxation, no atrophy and neurovascular exam is intact  Data Reviewed X-rays I've independently interpreted the x-ray as follows  3 views of the nondisplaced distal radius fracture of the left wrist: Transverse fracture no angulation  Assessment    Left Wrist fracture  Nondisplaced distal radius    Plan    Short arm cast applied  Follow-up 27th of March for x-ray in the cast expect 6 weeks total casting may placed in a brace at that point depending on healing

## 2015-05-05 NOTE — Patient Instructions (Signed)
KEEP CAST DRY   IF IT GETS WET DRY WITH A HAIR DRYER AND CALL THE OFFICE

## 2015-06-05 ENCOUNTER — Ambulatory Visit: Payer: Medicare Other | Admitting: Orthopedic Surgery

## 2015-06-05 ENCOUNTER — Encounter: Payer: Self-pay | Admitting: Orthopedic Surgery

## 2015-06-05 ENCOUNTER — Ambulatory Visit (INDEPENDENT_AMBULATORY_CARE_PROVIDER_SITE_OTHER): Payer: Medicare Other

## 2015-06-05 VITALS — BP 153/67 | Ht 61.0 in | Wt 180.0 lb

## 2015-06-05 DIAGNOSIS — S62102D Fracture of unspecified carpal bone, left wrist, subsequent encounter for fracture with routine healing: Secondary | ICD-10-CM | POA: Diagnosis not present

## 2015-06-05 NOTE — Patient Instructions (Signed)
Wear brace except for bathing. Wear brace when sleeping. Okay to remove brace to do your hair.  Return in 4 weeks for x-ray no heavy lifting which means limit lifting to less than 5 pounds with the left wrist and hand

## 2015-06-05 NOTE — Progress Notes (Signed)
Patient ID: [name], female   DOB: 12/30/1945, 70 y.o.   MRN: [id number]  Chief Complaint  Patient presents with  . Follow-up    follow up + xray in cast left wrist fx, DOI 05/02/15    HPI  Wrist Injury     ER FOLLOW UP LEFT WRIST FRACTURE, DOI 05/02/15        Location of pain left wrist. Quality dull. Severity mild. Duration 3 days. Timing constant. Context fell backwards landing on her left wrist.  Osteoporosis screening has been done and she says she's normal  Review of systems constitutional no fever or chills. Musculoskeletal as stated skin no changes neurologic no tingling or numbness  Review of Systems  Neurological: Negative for tingling and sensory change.    BP 153/67 mmHg  Ht 5\' 1"  (1.549 m)  Wt 180 lb (81.647 kg)  BMI 34.03 kg/m2  Physical Exam fingers moving well neurovascular exam intact  Ortho Exam  As above  X-ray show fracture stable the distal radius fracture is nondisplaced and has neutral volar tilt  ASSESSMENT AND PLAN   Cast off  Removable splint  Okay to remove for fine dexterity and bathing otherwise keep on including nighttime  Return in 4 weeks for x-ray left wrist

## 2015-07-03 ENCOUNTER — Encounter: Payer: Self-pay | Admitting: Orthopedic Surgery

## 2015-07-03 ENCOUNTER — Ambulatory Visit: Payer: Medicare Other | Admitting: Orthopedic Surgery

## 2015-07-03 ENCOUNTER — Ambulatory Visit (INDEPENDENT_AMBULATORY_CARE_PROVIDER_SITE_OTHER): Payer: Medicare Other

## 2015-07-03 VITALS — BP 152/82 | Ht 61.0 in | Wt 185.0 lb

## 2015-07-03 DIAGNOSIS — S62102D Fracture of unspecified carpal bone, left wrist, subsequent encounter for fracture with routine healing: Secondary | ICD-10-CM

## 2015-07-03 NOTE — Progress Notes (Signed)
Chief Complaint  Patient presents with  . Follow-up    FOLLOW UP + XRAY LEFT WRIST FX, DOI 05/02/15    BP 152/82 mmHg  Ht 5\' 1"  (1.549 m)  Wt 185 lb (83.915 kg)  BMI 34.97 kg/m2  Wrist fracture x-rays show fracture healing alignment is near-anatomic  Patient has minimal symptoms to palpation has good flexion normal sensation  Patient released

## 2015-07-03 NOTE — Patient Instructions (Signed)
Do not lift anything too heavy for the first week

## 2015-09-01 DIAGNOSIS — E785 Hyperlipidemia, unspecified: Secondary | ICD-10-CM | POA: Diagnosis not present

## 2015-09-01 DIAGNOSIS — Z79899 Other long term (current) drug therapy: Secondary | ICD-10-CM | POA: Diagnosis not present

## 2015-09-15 DIAGNOSIS — I10 Essential (primary) hypertension: Secondary | ICD-10-CM | POA: Diagnosis not present

## 2015-09-15 DIAGNOSIS — E785 Hyperlipidemia, unspecified: Secondary | ICD-10-CM | POA: Diagnosis not present

## 2015-09-18 ENCOUNTER — Other Ambulatory Visit (HOSPITAL_COMMUNITY): Payer: Self-pay | Admitting: Internal Medicine

## 2015-09-18 DIAGNOSIS — Z1231 Encounter for screening mammogram for malignant neoplasm of breast: Secondary | ICD-10-CM

## 2015-09-21 ENCOUNTER — Ambulatory Visit (HOSPITAL_COMMUNITY)
Admission: RE | Admit: 2015-09-21 | Discharge: 2015-09-21 | Disposition: A | Discharge status: Home / Self Care | Payer: Medicare Other | Source: Ambulatory Visit | Attending: Internal Medicine | Admitting: Internal Medicine

## 2015-09-21 DIAGNOSIS — Z1231 Encounter for screening mammogram for malignant neoplasm of breast: Secondary | ICD-10-CM | POA: Diagnosis not present

## 2015-12-25 ENCOUNTER — Encounter: Payer: Self-pay | Admitting: Internal Medicine

## 2016-01-09 DIAGNOSIS — Z23 Encounter for immunization: Secondary | ICD-10-CM | POA: Diagnosis not present

## 2016-03-14 DIAGNOSIS — E785 Hyperlipidemia, unspecified: Secondary | ICD-10-CM | POA: Diagnosis not present

## 2016-03-14 DIAGNOSIS — Z79899 Other long term (current) drug therapy: Secondary | ICD-10-CM | POA: Diagnosis not present

## 2016-03-14 DIAGNOSIS — K760 Fatty (change of) liver, not elsewhere classified: Secondary | ICD-10-CM | POA: Diagnosis not present

## 2016-03-14 DIAGNOSIS — K219 Gastro-esophageal reflux disease without esophagitis: Secondary | ICD-10-CM | POA: Diagnosis not present

## 2016-03-21 DIAGNOSIS — E785 Hyperlipidemia, unspecified: Secondary | ICD-10-CM | POA: Diagnosis not present

## 2016-03-21 DIAGNOSIS — Z23 Encounter for immunization: Secondary | ICD-10-CM | POA: Diagnosis not present

## 2016-03-21 DIAGNOSIS — Z6836 Body mass index (BMI) 36.0-36.9, adult: Secondary | ICD-10-CM | POA: Diagnosis not present

## 2016-03-21 DIAGNOSIS — I10 Essential (primary) hypertension: Secondary | ICD-10-CM | POA: Diagnosis not present

## 2016-06-14 DIAGNOSIS — Z79899 Other long term (current) drug therapy: Secondary | ICD-10-CM | POA: Diagnosis not present

## 2016-06-14 DIAGNOSIS — E785 Hyperlipidemia, unspecified: Secondary | ICD-10-CM | POA: Diagnosis not present

## 2016-06-14 DIAGNOSIS — R945 Abnormal results of liver function studies: Secondary | ICD-10-CM | POA: Diagnosis not present

## 2016-06-20 DIAGNOSIS — E785 Hyperlipidemia, unspecified: Secondary | ICD-10-CM | POA: Diagnosis not present

## 2016-06-20 DIAGNOSIS — I10 Essential (primary) hypertension: Secondary | ICD-10-CM | POA: Diagnosis not present

## 2016-08-12 DIAGNOSIS — I10 Essential (primary) hypertension: Secondary | ICD-10-CM | POA: Diagnosis not present

## 2017-01-10 DIAGNOSIS — Z23 Encounter for immunization: Secondary | ICD-10-CM | POA: Diagnosis not present

## 2017-03-18 DIAGNOSIS — K760 Fatty (change of) liver, not elsewhere classified: Secondary | ICD-10-CM | POA: Diagnosis not present

## 2017-03-18 DIAGNOSIS — E785 Hyperlipidemia, unspecified: Secondary | ICD-10-CM | POA: Diagnosis not present

## 2017-03-18 DIAGNOSIS — I10 Essential (primary) hypertension: Secondary | ICD-10-CM | POA: Diagnosis not present

## 2017-03-18 DIAGNOSIS — Z79899 Other long term (current) drug therapy: Secondary | ICD-10-CM | POA: Diagnosis not present

## 2017-03-24 DIAGNOSIS — Z6835 Body mass index (BMI) 35.0-35.9, adult: Secondary | ICD-10-CM | POA: Diagnosis not present

## 2017-03-24 DIAGNOSIS — E785 Hyperlipidemia, unspecified: Secondary | ICD-10-CM | POA: Diagnosis not present

## 2017-03-24 DIAGNOSIS — I10 Essential (primary) hypertension: Secondary | ICD-10-CM | POA: Diagnosis not present

## 2017-07-28 DIAGNOSIS — R030 Elevated blood-pressure reading, without diagnosis of hypertension: Secondary | ICD-10-CM | POA: Diagnosis not present

## 2017-08-11 DIAGNOSIS — N390 Urinary tract infection, site not specified: Secondary | ICD-10-CM | POA: Diagnosis not present

## 2017-08-18 DIAGNOSIS — N3000 Acute cystitis without hematuria: Secondary | ICD-10-CM | POA: Diagnosis not present

## 2017-11-26 ENCOUNTER — Ambulatory Visit: Payer: Medicare Other | Admitting: Urology

## 2017-11-26 DIAGNOSIS — R351 Nocturia: Secondary | ICD-10-CM | POA: Diagnosis not present

## 2017-11-26 DIAGNOSIS — N3941 Urge incontinence: Secondary | ICD-10-CM

## 2017-12-01 DIAGNOSIS — Z23 Encounter for immunization: Secondary | ICD-10-CM | POA: Diagnosis not present

## 2017-12-01 DIAGNOSIS — R030 Elevated blood-pressure reading, without diagnosis of hypertension: Secondary | ICD-10-CM | POA: Diagnosis not present

## 2017-12-24 ENCOUNTER — Ambulatory Visit: Payer: Medicare Other | Admitting: Urology

## 2017-12-24 DIAGNOSIS — R351 Nocturia: Secondary | ICD-10-CM

## 2017-12-24 DIAGNOSIS — N3941 Urge incontinence: Secondary | ICD-10-CM | POA: Diagnosis not present

## 2018-03-23 DIAGNOSIS — I10 Essential (primary) hypertension: Secondary | ICD-10-CM | POA: Diagnosis not present

## 2018-03-23 DIAGNOSIS — Z79899 Other long term (current) drug therapy: Secondary | ICD-10-CM | POA: Diagnosis not present

## 2018-03-27 DIAGNOSIS — E785 Hyperlipidemia, unspecified: Secondary | ICD-10-CM | POA: Diagnosis not present

## 2018-03-27 DIAGNOSIS — R05 Cough: Secondary | ICD-10-CM | POA: Diagnosis not present

## 2018-03-27 DIAGNOSIS — I10 Essential (primary) hypertension: Secondary | ICD-10-CM | POA: Diagnosis not present

## 2018-07-27 DIAGNOSIS — N3281 Overactive bladder: Secondary | ICD-10-CM | POA: Diagnosis not present

## 2018-07-27 DIAGNOSIS — I10 Essential (primary) hypertension: Secondary | ICD-10-CM | POA: Diagnosis not present

## 2018-11-10 ENCOUNTER — Other Ambulatory Visit: Payer: Self-pay | Admitting: Internal Medicine

## 2018-11-10 ENCOUNTER — Other Ambulatory Visit (HOSPITAL_COMMUNITY): Payer: Self-pay | Admitting: Internal Medicine

## 2018-11-10 DIAGNOSIS — I779 Disorder of arteries and arterioles, unspecified: Secondary | ICD-10-CM

## 2018-11-13 ENCOUNTER — Other Ambulatory Visit: Payer: Self-pay

## 2018-11-13 ENCOUNTER — Ambulatory Visit (HOSPITAL_COMMUNITY)
Admission: RE | Admit: 2018-11-13 | Discharge: 2018-11-13 | Disposition: A | Discharge status: Home / Self Care | Payer: Medicare Other | Source: Ambulatory Visit | Attending: Internal Medicine | Admitting: Internal Medicine

## 2018-11-13 DIAGNOSIS — I779 Disorder of arteries and arterioles, unspecified: Secondary | ICD-10-CM

## 2018-11-13 DIAGNOSIS — I739 Peripheral vascular disease, unspecified: Secondary | ICD-10-CM | POA: Diagnosis not present

## 2018-11-13 DIAGNOSIS — I6523 Occlusion and stenosis of bilateral carotid arteries: Secondary | ICD-10-CM | POA: Diagnosis not present

## 2018-11-17 DIAGNOSIS — I6522 Occlusion and stenosis of left carotid artery: Secondary | ICD-10-CM | POA: Diagnosis not present

## 2018-11-19 ENCOUNTER — Other Ambulatory Visit (HOSPITAL_COMMUNITY): Payer: Self-pay | Admitting: Internal Medicine

## 2018-11-19 ENCOUNTER — Other Ambulatory Visit: Payer: Self-pay

## 2018-11-19 ENCOUNTER — Encounter: Payer: Self-pay | Admitting: Vascular Surgery

## 2018-11-19 ENCOUNTER — Ambulatory Visit (HOSPITAL_COMMUNITY)
Admission: RE | Admit: 2018-11-19 | Discharge: 2018-11-19 | Disposition: A | Discharge status: Home / Self Care | Payer: Medicare Other | Source: Ambulatory Visit | Attending: Family | Admitting: Family

## 2018-11-19 ENCOUNTER — Ambulatory Visit: Payer: Medicare Other | Admitting: Vascular Surgery

## 2018-11-19 VITALS — BP 148/72 | HR 74 | Temp 97.3°F | Resp 18 | Ht 61.0 in | Wt 183.0 lb

## 2018-11-19 DIAGNOSIS — I6522 Occlusion and stenosis of left carotid artery: Secondary | ICD-10-CM | POA: Diagnosis not present

## 2018-11-19 NOTE — Progress Notes (Signed)
Referring Physician: Dr Carylon Perches  Patient name: [name] MRN: [id number] DOB: 1945-06-06 Sex: female  REASON FOR CONSULT: Left carotid stenosis  HPI: KATHLEEN CHANEY is a 73 y.o. female, who previously was noted to have a left carotid stenosis on a [facility] screening about a year ago.  Recently she had noted left side pulsatile tinnitus.  A carotid duplex scan was obtained at [facility] on September 4 which suggested greater than 70% left internal carotid artery stenosis with no significant right internal carotid artery stenosis.  She has never had any episodes of TIA amaurosis or stroke.  She is bothered some by the pulsatile tinnitus in her left ear.  Other medical problems include hypertension and elevated cholesterol.  These are currently controlled.  She has never smoked.  She is on aspirin and a statin.  Her son was present for the office visit today.  Past Medical History:  Diagnosis Date  . Bacterial overgrowth syndrome    Positive HBT  . GERD (gastroesophageal reflux disease)   . Hypercholesteremia   . Hypertension    Past Surgical History:  Procedure Laterality Date  . ABDOMINAL HYSTERECTOMY    . BACTERIAL OVERGROWTH TEST N/A 01/07/2013   Procedure: BACTERIAL OVERGROWTH TEST;  Surgeon: Corbin Ade, MD;  [facility];  Service: Endoscopy;  Laterality: N/A;  7:30  . Bladder Tack     x 2  . COLONOSCOPY  July 2007   Dr. Lovell Sheehan: normal. Due for screening 2017  . ESOPHAGOGASTRODUODENOSCOPY N/A 10/12/2012   RMR: Small inlet patch.  Small hiatal hernia.  Status post gastric biopsy, negative H.pylori  . ESOPHAGOGASTRODUODENOSCOPY (EGD) WITH ESOPHAGEAL DILATION  Dec 2011   Dr. Jena Gauss: non-critical Schatzki's ring s/p dilation with 56 F dilator, small hiatal hernia, otherwise normal  . KNEE ARTHROSCOPY     Bilateral (torn meniscus)  . ROTATOR CUFF REPAIR    . TUBAL LIGATION      Family History  Problem Relation Age of Onset  . Colon cancer Neg Hx      SOCIAL HISTORY: Social History   Socioeconomic History  . Marital status: Divorced    Spouse name: Not on file  . Number of children: Not on file  . Years of education: Not on file  . Highest education level: Not on file  Occupational History  . Occupation: retired    Associate Professor: RETIRED    Comment: [employer]  . Financial resource strain: Not on file  . Food insecurity    Worry: Not on file    Inability: Not on file  . Transportation needs    Medical: Not on file    Non-medical: Not on file  Tobacco Use  . Smoking status: Never Smoker  . Smokeless tobacco: Never Used  Substance and Sexual Activity  . Alcohol use: No  . Drug use: No  . Sexual activity: Not on file  Lifestyle  . Physical activity    Days per week: Not on file    Minutes per session: Not on file  . Stress: Not on file  Relationships  . Social Musician on phone: Not on file    Gets together: Not on file    Attends religious service: Not on file    Active member of club or organization: Not on file    Attends meetings of clubs or organizations: Not on file    Relationship status: Not on file  .  Intimate partner violence    Fear of current or ex partner: Not on file    Emotionally abused: Not on file    Physically abused: Not on file    Forced sexual activity: Not on file  Other Topics Concern  . Not on file  Social History Narrative  . Not on file    No Known Allergies  Current Outpatient Medications  Medication Sig Dispense Refill  . amLODipine (NORVASC) 5 MG tablet Take 1 tablet by mouth daily.  1  . aspirin EC 81 MG tablet Take 81 mg by mouth daily.    Marland Kitchen atorvastatin (LIPITOR) 20 MG tablet Take 20 mg by mouth at bedtime.  1   No current facility-administered medications for this visit.     ROS:   General:  No weight loss, Fever, chills  HEENT: No recent headaches, no nasal bleeding, no visual changes, no sore throat  Neurologic: No dizziness,  blackouts, seizures. No recent symptoms of stroke or mini- stroke. No recent episodes of slurred speech, or temporary blindness.  Cardiac: No recent episodes of chest pain/pressure, no shortness of breath at rest.  No shortness of breath with exertion.  Denies history of atrial fibrillation or irregular heartbeat  Vascular: No history of rest pain in feet.  No history of claudication.  No history of non-healing ulcer, No history of DVT   Pulmonary: No home oxygen, no productive cough, no hemoptysis,  No asthma or wheezing  Musculoskeletal:  [ ]  Arthritis, [ ]  Low back pain,  [ ]  Joint pain  Hematologic:No history of hypercoagulable state.  No history of easy bleeding.  No history of anemia  Gastrointestinal: No hematochezia or melena,  No gastroesophageal reflux, no trouble swallowing  Urinary: [ ]  chronic Kidney disease, [ ]  on HD - [ ]  MWF or [ ]  TTHS, [ ]  Burning with urination, [ ]  Frequent urination, [ ]  Difficulty urinating;   Skin: No rashes  Psychological: No history of anxiety,  No history of depression   Physical Examination  Vitals:   11/19/18 1435 11/19/18 1438  BP: (!) 143/71 (!) 148/72  Pulse: 74   Resp: 18   Temp: (!) 97.3 F (36.3 C)   SpO2: 98%   Weight: 183 lb (83 kg)   Height: 5\' 1"  (1.549 m)     Body mass index is 34.58 kg/m.  General:  Alert and oriented, no acute distress HEENT: Normal Neck: 2+ carotid pulses  pulmonary: Clear to auscultation bilaterally Cardiac: Regular Rate and Rhythm Abdomen: Soft, non-tender, non-distended, no mass Skin: No rash Extremity Pulses:  2+ radial, brachial, femoral, dorsalis pedis, posterior tibial pulses bilaterally Musculoskeletal: No deformity or edema  Neurologic: Upper and lower extremity motor 5/5 and symmetric, no facial asymmetry  DATA:  Patient had a carotid duplex exam at our office today on the left side.  This showed a 40 to 60% left internal carotid artery stenosis.  Velocities were 220/43.   Velocities at the [facility] duplex scan were 227/43.  These are quite similar.  Although comment was made about the peak systolic velocity at any pain hospital.  The end-diastolic velocity is a much greater component of diagnosing the actual level of stenosis.  Therefore I believe she is actually 40 to 60% rather than greater than 70%.  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  I did discuss with the patient that the pulsatile tinnitus is probably related to her carotid disease.  However we would not consider  carotid endarterectomy for the symptom of pulsatile tinnitus alone.  I told her we would only consider an operation for symptoms of TIA amaurosis or stroke or progression of asymptomatic lesion to greater than 80%.  She was reassured by this.  She was given a copy of her duplex report from our office today.   PLAN: Patient will follow-up in our [facility] clinic in 1 year with a repeat carotid duplex exam.  She will follow-up sooner if she develops any symptoms of TIA amaurosis or stroke.  She will continue her aspirin and statin.  Fabienne Brunsharles [place] Stell, MD [facility] [place] Office: [phone] Pager: [phone]

## 2018-11-30 DIAGNOSIS — I6522 Occlusion and stenosis of left carotid artery: Secondary | ICD-10-CM | POA: Diagnosis not present

## 2018-11-30 DIAGNOSIS — H9311 Tinnitus, right ear: Secondary | ICD-10-CM | POA: Diagnosis not present

## 2018-12-14 DIAGNOSIS — Z23 Encounter for immunization: Secondary | ICD-10-CM | POA: Diagnosis not present

## 2019-01-04 ENCOUNTER — Ambulatory Visit (INDEPENDENT_AMBULATORY_CARE_PROVIDER_SITE_OTHER): Payer: Medicare Other | Admitting: Otolaryngology

## 2019-01-04 ENCOUNTER — Other Ambulatory Visit: Payer: Self-pay

## 2019-01-04 DIAGNOSIS — H9312 Tinnitus, left ear: Secondary | ICD-10-CM | POA: Diagnosis not present

## 2019-01-11 ENCOUNTER — Other Ambulatory Visit (HOSPITAL_COMMUNITY): Payer: Self-pay | Admitting: Otolaryngology

## 2019-01-11 ENCOUNTER — Other Ambulatory Visit: Payer: Self-pay | Admitting: Otolaryngology

## 2019-01-11 DIAGNOSIS — H93A9 Pulsatile tinnitus, unspecified ear: Secondary | ICD-10-CM

## 2019-01-20 ENCOUNTER — Ambulatory Visit (HOSPITAL_COMMUNITY)
Admission: RE | Admit: 2019-01-20 | Discharge: 2019-01-20 | Disposition: A | Discharge status: Home / Self Care | Payer: Medicare Other | Source: Ambulatory Visit | Attending: Otolaryngology | Admitting: Otolaryngology

## 2019-01-20 ENCOUNTER — Other Ambulatory Visit: Payer: Self-pay

## 2019-01-20 DIAGNOSIS — H93A9 Pulsatile tinnitus, unspecified ear: Secondary | ICD-10-CM | POA: Diagnosis not present

## 2019-01-20 LAB — POCT I-STAT CREATININE: Creatinine, Ser: 0.8 mg/dL (ref 0.44–1.00)

## 2019-01-20 MED ORDER — GADOBUTROL 1 MMOL/ML IV SOLN
7.5000 mL | Freq: Once | INTRAVENOUS | Status: AC | PRN
  Administered 2019-01-20: 7.5 mL via INTRAVENOUS

## 2019-02-02 ENCOUNTER — Other Ambulatory Visit (HOSPITAL_COMMUNITY): Payer: Self-pay | Admitting: Otolaryngology

## 2019-02-02 ENCOUNTER — Other Ambulatory Visit: Payer: Self-pay | Admitting: Otolaryngology

## 2019-02-02 DIAGNOSIS — H93A9 Pulsatile tinnitus, unspecified ear: Secondary | ICD-10-CM

## 2019-02-12 ENCOUNTER — Other Ambulatory Visit: Payer: Self-pay

## 2019-02-12 ENCOUNTER — Ambulatory Visit (HOSPITAL_COMMUNITY)
Admission: RE | Admit: 2019-02-12 | Discharge: 2019-02-12 | Disposition: A | Discharge status: Home / Self Care | Payer: Medicare Other | Source: Ambulatory Visit | Attending: Otolaryngology | Admitting: Otolaryngology

## 2019-02-12 DIAGNOSIS — H93A2 Pulsatile tinnitus, left ear: Secondary | ICD-10-CM | POA: Diagnosis not present

## 2019-02-12 DIAGNOSIS — H93A9 Pulsatile tinnitus, unspecified ear: Secondary | ICD-10-CM

## 2019-02-12 DIAGNOSIS — I671 Cerebral aneurysm, nonruptured: Secondary | ICD-10-CM | POA: Diagnosis not present

## 2019-02-18 DIAGNOSIS — I10 Essential (primary) hypertension: Secondary | ICD-10-CM | POA: Diagnosis not present

## 2019-02-18 DIAGNOSIS — I671 Cerebral aneurysm, nonruptured: Secondary | ICD-10-CM | POA: Diagnosis not present

## 2019-02-18 DIAGNOSIS — H93A2 Pulsatile tinnitus, left ear: Secondary | ICD-10-CM | POA: Diagnosis not present

## 2019-02-18 DIAGNOSIS — Z6834 Body mass index (BMI) 34.0-34.9, adult: Secondary | ICD-10-CM | POA: Diagnosis not present

## 2019-02-22 ENCOUNTER — Other Ambulatory Visit: Payer: Self-pay | Admitting: Neurosurgery

## 2019-02-22 DIAGNOSIS — H93A2 Pulsatile tinnitus, left ear: Secondary | ICD-10-CM

## 2019-02-24 ENCOUNTER — Other Ambulatory Visit: Payer: Self-pay | Admitting: Neurosurgery

## 2019-02-26 ENCOUNTER — Other Ambulatory Visit (HOSPITAL_COMMUNITY)
Admission: RE | Admit: 2019-02-26 | Discharge: 2019-02-26 | Disposition: A | Discharge status: Home / Self Care | Payer: Medicare Other | Source: Ambulatory Visit | Attending: Neurosurgery | Admitting: Neurosurgery

## 2019-02-26 ENCOUNTER — Other Ambulatory Visit: Payer: Self-pay

## 2019-02-26 DIAGNOSIS — Z01812 Encounter for preprocedural laboratory examination: Secondary | ICD-10-CM | POA: Insufficient documentation

## 2019-02-26 DIAGNOSIS — Z20828 Contact with and (suspected) exposure to other viral communicable diseases: Secondary | ICD-10-CM | POA: Diagnosis not present

## 2019-02-26 LAB — SARS CORONAVIRUS 2 (TAT 6-24 HRS): SARS Coronavirus 2: NEGATIVE

## 2019-03-01 ENCOUNTER — Other Ambulatory Visit: Payer: Self-pay | Admitting: Radiology

## 2019-03-01 NOTE — H&P (Signed)
Chief Complaint   Anterior communicating artery aneurysm  HPI   HPI: [name] is a 73 y.o. 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   Symptoms have been progressive over the last year.  She does endorse a history of very intermittent double vision.  No headaches, numbness tingling or weakness.  She presents today for diagnostic cerebral angiogram for further workup and characterization of A-comm aneurysm.  She is without any concerns.  Patient Active Problem List   Diagnosis Date Noted  . Abdominal pain, other specified site 02/11/2013  . Cough 02/11/2013  . Abnormal smell 02/11/2013  . Dysgeusia 12/15/2012  . GERD (gastroesophageal reflux disease) 10/08/2012  . Encounter for screening colonoscopy 10/08/2012  . DYSPHAGIA UNSPECIFIED 02/20/2010  . HYPERLIPIDEMIA 02/16/2010  . OVERWEIGHT 02/16/2010  . GASTROESOPHAGEAL REFLUX DISEASE 02/16/2010  . OTHER NONSPECIFIC ABNORMAL SERUM ENZYME LEVELS 02/16/2010    PMH: Past Medical History:  Diagnosis Date  . Bacterial overgrowth syndrome    Positive HBT  . GERD (gastroesophageal reflux disease)   . Hypercholesteremia   . Hypertension     PSH: Past Surgical History:  Procedure Laterality Date  . ABDOMINAL HYSTERECTOMY    . BACTERIAL OVERGROWTH TEST N/A 01/07/2013   Procedure: BACTERIAL OVERGROWTH TEST;  Surgeon: Daneil Dolin, MD;  [facility];  Service: Endoscopy;  Laterality: N/A;  7:30  . Bladder Tack     x 2  . COLONOSCOPY  July 2007   Dr. Arnoldo Morale: normal. Due for screening 2017  . ESOPHAGOGASTRODUODENOSCOPY N/A 10/12/2012   RMR: Small inlet patch.  Small hiatal hernia.  Status post gastric biopsy, negative H.pylori  . ESOPHAGOGASTRODUODENOSCOPY (EGD) WITH ESOPHAGEAL DILATION  Dec 2011   Dr. Gala Romney: non-critical Schatzki's ring s/p dilation with 67 F dilator, small hiatal hernia, otherwise normal  . KNEE ARTHROSCOPY     Bilateral (torn  meniscus)  . ROTATOR CUFF REPAIR    . TUBAL LIGATION      (Not in a hospital admission)   SH: Social History   Tobacco Use  . Smoking status: Never Smoker  . Smokeless tobacco: Never Used  Substance Use Topics  . Alcohol use: No  . Drug use: No    MEDS: Prior to Admission medications   Medication Sig Start Date End Date Taking? Authorizing Provider  amLODipine (NORVASC) 5 MG tablet Take 1 tablet by mouth daily. 04/03/15   [provider]  aspirin EC 81 MG tablet Take 81 mg by mouth daily.    [provider]  atorvastatin (LIPITOR) 20 MG tablet Take 20 mg by mouth at bedtime. 02/07/15   [provider]    ALLERGY: No Known Allergies  Social History   Tobacco Use  . Smoking status: Never Smoker  . Smokeless tobacco: Never Used  Substance Use Topics  . Alcohol use: No     Family History  Problem Relation Age of Onset  . Colon cancer Neg Hx      ROS   ROS  Exam   There were no vitals filed for this visit. General appearance: WDWN, NAD Eyes: No scleral injection Cardiovascular: Regular rate and rhythm without murmurs, rubs, gallops. No edema or variciosities. Distal pulses normal. Pulmonary: Effort normal, non-labored breathing Musculoskeletal:     Muscle tone upper extremities: Normal    Muscle tone lower extremities: Normal    Motor exam: Upper Extremities Deltoid Bicep Tricep Grip  Right 5/5 5/5 5/5 5/5  Left 5/5  5/5 5/5 5/5   Lower Extremity IP Quad PF DF EHL  Right 5/5 5/5 5/5 5/5 5/5  Left 5/5 5/5 5/5 5/5 5/5   Neurological Mental Status:    - Patient is awake, alert, oriented to person, place, month, year, and situation    - Patient is able to give a clear and coherent history.    - No signs of aphasia or neglect Cranial Nerves    - II: Visual Fields are full. PERRL    - III/IV/VI: EOMI without ptosis or diploplia.     - V: Facial sensation is grossly normal    - VII: Facial movement is symmetric.     - VIII:  hearing is intact to voice    - X: Uvula elevates symmetrically    - XI: Shoulder shrug is symmetric.    - XII: tongue is midline without atrophy or fasciculations.  Sensory: Sensation grossly intact to LT  Results - Imaging/Labs   No results found for this or any previous visit (from the past 48 hour(s)).  No results found.  IMAGING: MRI, MRA, and MRV were reviewed.  These demonstrate the presence of a small approximately 3 mm anterior communicating artery aneurysm.  MRV is normal.   Impression/Plan   73 y.o. 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  We will proceed with diagnostic cerebral angiogram for further workup of the tinnitus this as well as characterization of the aneurysm.  Risks, benefits and alternatives were discussed.  Patient stated understanding and wished to proceed.  [name], PA-C [facility]

## 2019-03-02 ENCOUNTER — Other Ambulatory Visit: Payer: Self-pay | Admitting: Neurosurgery

## 2019-03-02 ENCOUNTER — Other Ambulatory Visit: Payer: Self-pay

## 2019-03-02 ENCOUNTER — Ambulatory Visit (HOSPITAL_COMMUNITY)
Admission: RE | Admit: 2019-03-02 | Discharge: 2019-03-02 | Disposition: A | Discharge status: Home / Self Care | Payer: Medicare Other | Source: Ambulatory Visit | Attending: Neurosurgery | Admitting: Neurosurgery

## 2019-03-02 DIAGNOSIS — Z79899 Other long term (current) drug therapy: Secondary | ICD-10-CM | POA: Diagnosis not present

## 2019-03-02 DIAGNOSIS — I10 Essential (primary) hypertension: Secondary | ICD-10-CM | POA: Insufficient documentation

## 2019-03-02 DIAGNOSIS — H93A2 Pulsatile tinnitus, left ear: Secondary | ICD-10-CM

## 2019-03-02 DIAGNOSIS — Z7982 Long term (current) use of aspirin: Secondary | ICD-10-CM | POA: Diagnosis not present

## 2019-03-02 DIAGNOSIS — K219 Gastro-esophageal reflux disease without esophagitis: Secondary | ICD-10-CM | POA: Insufficient documentation

## 2019-03-02 DIAGNOSIS — E785 Hyperlipidemia, unspecified: Secondary | ICD-10-CM | POA: Insufficient documentation

## 2019-03-02 DIAGNOSIS — E7800 Pure hypercholesterolemia, unspecified: Secondary | ICD-10-CM | POA: Insufficient documentation

## 2019-03-02 DIAGNOSIS — I671 Cerebral aneurysm, nonruptured: Secondary | ICD-10-CM | POA: Diagnosis not present

## 2019-03-02 DIAGNOSIS — R930 Abnormal findings on diagnostic imaging of skull and head, not elsewhere classified: Secondary | ICD-10-CM | POA: Diagnosis not present

## 2019-03-02 HISTORY — PX: IR US GUIDE VASC ACCESS RIGHT: IMG2390

## 2019-03-02 HISTORY — PX: IR ANGIO VERTEBRAL SEL VERTEBRAL BILAT MOD SED: IMG5369

## 2019-03-02 HISTORY — PX: IR ANGIO EXTERNAL CAROTID SEL EXT CAROTID BILAT MOD SED: IMG5372

## 2019-03-02 HISTORY — PX: IR ANGIO INTRA EXTRACRAN SEL INTERNAL CAROTID BILAT MOD SED: IMG5363

## 2019-03-02 LAB — URINALYSIS, ROUTINE W REFLEX MICROSCOPIC
Bilirubin Urine: NEGATIVE
Glucose, UA: NEGATIVE mg/dL
Hgb urine dipstick: NEGATIVE
Ketones, ur: NEGATIVE mg/dL
Leukocytes,Ua: NEGATIVE
Nitrite: NEGATIVE
Protein, ur: NEGATIVE mg/dL
Specific Gravity, Urine: 1.012 (ref 1.005–1.030)
pH: 6 (ref 5.0–8.0)

## 2019-03-02 LAB — CBC WITH DIFFERENTIAL/PLATELET
Abs Immature Granulocytes: 0.01 10*3/uL (ref 0.00–0.07)
Basophils Absolute: 0.1 10*3/uL (ref 0.0–0.1)
Basophils Relative: 1 %
Eosinophils Absolute: 0.1 10*3/uL (ref 0.0–0.5)
Eosinophils Relative: 2 %
HCT: 41.6 % (ref 36.0–46.0)
Hemoglobin: 13.8 g/dL (ref 12.0–15.0)
Immature Granulocytes: 0 %
Lymphocytes Relative: 39 %
Lymphs Abs: 2.5 10*3/uL (ref 0.7–4.0)
MCH: 30.4 pg (ref 26.0–34.0)
MCHC: 33.2 g/dL (ref 30.0–36.0)
MCV: 91.6 fL (ref 80.0–100.0)
Monocytes Absolute: 0.4 10*3/uL (ref 0.1–1.0)
Monocytes Relative: 6 %
Neutro Abs: 3.4 10*3/uL (ref 1.7–7.7)
Neutrophils Relative %: 52 %
Platelets: 223 10*3/uL (ref 150–400)
RBC: 4.54 MIL/uL (ref 3.87–5.11)
RDW: 11.9 % (ref 11.5–15.5)
WBC: 6.5 10*3/uL (ref 4.0–10.5)
nRBC: 0 % (ref 0.0–0.2)

## 2019-03-02 LAB — BASIC METABOLIC PANEL
Anion gap: 11 (ref 5–15)
BUN: 10 mg/dL (ref 8–23)
CO2: 24 mmol/L (ref 22–32)
Calcium: 9 mg/dL (ref 8.9–10.3)
Chloride: 107 mmol/L (ref 98–111)
Creatinine, Ser: 0.79 mg/dL (ref 0.44–1.00)
GFR calc Af Amer: >60 mL/min (ref >60)
GFR calc non Af Amer: >60 mL/min (ref >60)
Glucose, Bld: 105 mg/dL — ABNORMAL HIGH (ref 70–99)
Potassium: 4.1 mmol/L (ref 3.5–5.1)
Sodium: 142 mmol/L (ref 135–145)

## 2019-03-02 LAB — APTT: aPTT: 23 seconds — ABNORMAL LOW (ref 24–36)

## 2019-03-02 LAB — PROTIME-INR
INR: 1 (ref 0.8–1.2)
Prothrombin Time: 12.8 seconds (ref 11.4–15.2)

## 2019-03-02 MED ORDER — HYDROCODONE-ACETAMINOPHEN 5-325 MG PO TABS: 1.0000 | ORAL_TABLET | ORAL | Status: DC | PRN

## 2019-03-02 MED ORDER — IOHEXOL 300 MG/ML  SOLN
50.0000 mL | Freq: Once | INTRAMUSCULAR | Status: AC | PRN
  Administered 2019-03-02: 11:00:00 10 mL via INTRA_ARTERIAL

## 2019-03-02 MED ORDER — VERAPAMIL HCL 2.5 MG/ML IV SOLN
INTRAVENOUS | Status: AC
  Filled 2019-03-02: qty 2

## 2019-03-02 MED ORDER — MIDAZOLAM HCL 2 MG/2ML IJ SOLN
INTRAMUSCULAR | Status: AC | PRN
  Administered 2019-03-02: 1 mg via INTRAVENOUS

## 2019-03-02 MED ORDER — FENTANYL CITRATE (PF) 100 MCG/2ML IJ SOLN
INTRAMUSCULAR | Status: AC
  Filled 2019-03-02: qty 2

## 2019-03-02 MED ORDER — FENTANYL CITRATE (PF) 100 MCG/2ML IJ SOLN
INTRAMUSCULAR | Status: AC | PRN
  Administered 2019-03-02: 25 ug via INTRAVENOUS

## 2019-03-02 MED ORDER — MIDAZOLAM HCL 2 MG/2ML IJ SOLN
INTRAMUSCULAR | Status: AC
  Filled 2019-03-02: qty 2

## 2019-03-02 MED ORDER — CEFAZOLIN SODIUM-DEXTROSE 2-4 GM/100ML-% IV SOLN: 2.0000 g | INTRAVENOUS | Status: DC

## 2019-03-02 MED ORDER — LIDOCAINE HCL 1 % IJ SOLN
INTRAMUSCULAR | Status: AC
  Filled 2019-03-02: qty 20

## 2019-03-02 MED ORDER — IOHEXOL 300 MG/ML  SOLN
100.0000 mL | Freq: Once | INTRAMUSCULAR | Status: AC | PRN
  Administered 2019-03-02: 50 mL via INTRA_ARTERIAL

## 2019-03-02 MED ORDER — LIDOCAINE HCL 1 % IJ SOLN
INTRAMUSCULAR | Status: AC | PRN
  Administered 2019-03-02: 10 mL
  Administered 2019-03-02: 15 mL

## 2019-03-02 MED ORDER — HEPARIN SODIUM (PORCINE) 1000 UNIT/ML IJ SOLN
INTRAMUSCULAR | Status: AC | PRN
  Administered 2019-03-02: 2000 [IU] via INTRAVENOUS

## 2019-03-02 MED ORDER — SODIUM CHLORIDE 0.9 % IV SOLN: INTRAVENOUS | Status: DC

## 2019-03-02 MED ORDER — CHLORHEXIDINE GLUCONATE CLOTH 2 % EX PADS: 6.0000 | MEDICATED_PAD | Freq: Once | CUTANEOUS | Status: DC

## 2019-03-02 MED ORDER — HEPARIN SODIUM (PORCINE) 1000 UNIT/ML IJ SOLN
INTRAMUSCULAR | Status: AC
  Filled 2019-03-02: qty 1

## 2019-03-02 NOTE — Progress Notes (Signed)
No bleeding or hematoma noted after ambulation 

## 2019-03-02 NOTE — Discharge Instructions (Signed)
Femoral Site Care °This sheet gives you information about how to care for yourself after your procedure. Your health care provider may also give you more specific instructions. If you have problems or questions, contact your health care provider. °What can I expect after the procedure? °After the procedure, it is common to have: °· Bruising that usually fades within 1-2 weeks. °· Tenderness at the site. °Follow these instructions at home: °Wound care °· Follow instructions from your health care provider about how to take care of your insertion site. Make sure you: °? Wash your hands with soap and water before you change your bandage (dressing). If soap and water are not available, use hand sanitizer. °? Change your dressing as told by your health care provider. °? Leave stitches (sutures), skin glue, or adhesive strips in place. These skin closures may need to stay in place for 2 weeks or longer. If adhesive strip edges start to loosen and curl up, you may trim the loose edges. Do not remove adhesive strips completely unless your health care provider tells you to do that. °· Do not take baths, swim, or use a hot tub until your health care provider approves. °· You may shower 24-48 hours after the procedure or as told by your health care provider. °? Gently wash the site with plain soap and water. °? Pat the area dry with a clean towel. °? Do not rub the site. This may cause bleeding. °· Do not apply powder or lotion to the site. Keep the site clean and dry. °· Check your femoral site every day for signs of infection. Check for: °? Redness, swelling, or pain. °? Fluid or blood. °? Warmth. °? Pus or a bad smell. °Activity °· For the first 2-3 days after your procedure, or as long as directed: °? Avoid climbing stairs as much as possible. °? Do not squat. °· Do not lift anything that is heavier than 10 lb (4.5 kg), or the limit that you are told, until your health care provider says that it is safe. °· Rest as  directed. °? Avoid sitting for a long time without moving. Get up to take short walks every 1-2 hours. °· Do not drive for 24 hours if you were given a medicine to help you relax (sedative). °General instructions °· Take over-the-counter and prescription medicines only as told by your health care provider. °· Keep all follow-up visits as told by your health care provider. This is important. °Contact a health care provider if you have: °· A fever or chills. °· You have redness, swelling, or pain around your insertion site. °Get help right away if: °· The catheter insertion area swells very fast. °· You pass out. °· You suddenly start to sweat or your skin gets clammy. °· The catheter insertion area is bleeding, and the bleeding does not stop when you hold steady pressure on the area. °· The area near or just beyond the catheter insertion site becomes pale, cool, tingly, or numb. °These symptoms may represent a serious problem that is an emergency. Do not wait to see if the symptoms will go away. Get medical help right away. Call your local emergency services (911 in the [place] Do not drive yourself to the hospital. °Summary °· After the procedure, it is common to have bruising that usually fades within 1-2 weeks. °· Check your femoral site every day for signs of infection. °· Do not lift anything that is heavier than 10 lb (4.5 kg), or the   limit that you are told, until your health care provider says that it is safe. °This information is not intended to replace advice given to you by your health care provider. Make sure you discuss any questions you have with your health care provider. °Document Released: 10/29/2013 Document Revised: 03/10/2017 Document Reviewed: 03/10/2017 °Elsevier Patient Education © 2020 [employer] Inc. ° °

## 2019-03-02 NOTE — Brief Op Note (Signed)
  NEUROSURGERY BRIEF OPERATIVE  NOTE   PREOP DX: Acom Aneurysm, Pulsatile tinnitus  POSTOP DX: Pulsatile tinnitus  PROCEDURE: Diagnostic cerebral angiogram  SURGEON: Dr. Consuella Lose, MD  ANESTHESIA: IV Sedation with Local  EBL: Minimal  SPECIMENS: None  COMPLICATIONS: None  CONDITION: Stable to recovery  FINDINGS (Full report in [facility]): 1. Normal angiogram, no evidence of Acom aneurysm. No fistula or AVM seen to explain pulsatile tinnitus.

## 2019-03-26 DIAGNOSIS — E785 Hyperlipidemia, unspecified: Secondary | ICD-10-CM | POA: Diagnosis not present

## 2019-03-26 DIAGNOSIS — K219 Gastro-esophageal reflux disease without esophagitis: Secondary | ICD-10-CM | POA: Diagnosis not present

## 2019-03-26 DIAGNOSIS — I6522 Occlusion and stenosis of left carotid artery: Secondary | ICD-10-CM | POA: Diagnosis not present

## 2019-03-26 DIAGNOSIS — K760 Fatty (change of) liver, not elsewhere classified: Secondary | ICD-10-CM | POA: Diagnosis not present

## 2019-04-02 DIAGNOSIS — E785 Hyperlipidemia, unspecified: Secondary | ICD-10-CM | POA: Diagnosis not present

## 2019-04-02 DIAGNOSIS — I10 Essential (primary) hypertension: Secondary | ICD-10-CM | POA: Diagnosis not present

## 2019-04-08 DIAGNOSIS — I10 Essential (primary) hypertension: Secondary | ICD-10-CM | POA: Diagnosis not present

## 2019-04-08 DIAGNOSIS — I671 Cerebral aneurysm, nonruptured: Secondary | ICD-10-CM | POA: Diagnosis not present

## 2019-04-08 DIAGNOSIS — Z6835 Body mass index (BMI) 35.0-35.9, adult: Secondary | ICD-10-CM | POA: Diagnosis not present

## 2019-04-08 DIAGNOSIS — H93A2 Pulsatile tinnitus, left ear: Secondary | ICD-10-CM | POA: Diagnosis not present

## 2019-04-20 DIAGNOSIS — N390 Urinary tract infection, site not specified: Secondary | ICD-10-CM | POA: Diagnosis not present

## 2019-08-02 DIAGNOSIS — I6522 Occlusion and stenosis of left carotid artery: Secondary | ICD-10-CM | POA: Diagnosis not present

## 2019-08-02 DIAGNOSIS — I10 Essential (primary) hypertension: Secondary | ICD-10-CM | POA: Diagnosis not present

## 2019-08-02 DIAGNOSIS — Z6836 Body mass index (BMI) 36.0-36.9, adult: Secondary | ICD-10-CM | POA: Diagnosis not present

## 2019-12-17 DIAGNOSIS — I6522 Occlusion and stenosis of left carotid artery: Secondary | ICD-10-CM | POA: Diagnosis not present

## 2019-12-17 DIAGNOSIS — I10 Essential (primary) hypertension: Secondary | ICD-10-CM | POA: Diagnosis not present

## 2019-12-20 DIAGNOSIS — Z23 Encounter for immunization: Secondary | ICD-10-CM | POA: Diagnosis not present

## 2019-12-27 ENCOUNTER — Other Ambulatory Visit: Payer: Self-pay | Admitting: *Deleted

## 2019-12-27 DIAGNOSIS — I6522 Occlusion and stenosis of left carotid artery: Secondary | ICD-10-CM

## 2020-01-06 ENCOUNTER — Ambulatory Visit (HOSPITAL_COMMUNITY)
Admission: RE | Admit: 2020-01-06 | Discharge: 2020-01-06 | Disposition: A | Discharge status: Home / Self Care | Payer: Medicare Other | Source: Ambulatory Visit | Attending: Physician Assistant | Admitting: Physician Assistant

## 2020-01-06 ENCOUNTER — Ambulatory Visit: Payer: Medicare Other | Admitting: Physician Assistant

## 2020-01-06 ENCOUNTER — Other Ambulatory Visit: Payer: Self-pay

## 2020-01-06 VITALS — BP 150/76 | HR 83 | Temp 98.2°F | Resp 20 | Ht 61.0 in | Wt 181.5 lb

## 2020-01-06 DIAGNOSIS — I6522 Occlusion and stenosis of left carotid artery: Secondary | ICD-10-CM | POA: Insufficient documentation

## 2020-01-06 DIAGNOSIS — I6523 Occlusion and stenosis of bilateral carotid arteries: Secondary | ICD-10-CM

## 2020-01-06 NOTE — Progress Notes (Signed)
Office Note     CC:  follow up Requesting Provider:  Carylon Perches, MD  HPI: [name] is a 74 y.o. (Apr 16, 1945) female who presents for routine follow-up of left internal carotid artery stenosis.  She underwent a [facility] screening exam approximately 2 years ago.  A duplex carotid ultrasound obtained at [facility] suggested greater than 70% left ICA stenosis.  There was no significant right ICA stenosis.  She has a history of pulsatile tinnitus in her left ear.  Prior history of stroke or TIAs.  Past medical history significant for hypertension (calcium channel blocker) and elevated cholesterol.  Non-smoker. Not diabetic. She is compliant with aspirin and statin.  She was seen last year in September by Dr. Darrick Penna.  He reviewed and compared both her carotid duplex exams, the one from [facility] in the updated exam at [facility].  He concluded that her left carotid stenosis was actually in the 40 to 60% range rather than greater than 70%.   Past Medical History:  Diagnosis Date  . Bacterial overgrowth syndrome    Positive HBT  . GERD (gastroesophageal reflux disease)   . Hypercholesteremia   . Hypertension     Past Surgical History:  Procedure Laterality Date  . ABDOMINAL HYSTERECTOMY    . BACTERIAL OVERGROWTH TEST N/A 01/07/2013   Procedure: BACTERIAL OVERGROWTH TEST;  Surgeon: Corbin Ade, MD;  [facility];  Service: Endoscopy;  Laterality: N/A;  7:30  . Bladder Tack     x 2  . COLONOSCOPY  July 2007   Dr. Lovell Sheehan: normal. Due for screening 2017  . ESOPHAGOGASTRODUODENOSCOPY N/A 10/12/2012   RMR: Small inlet patch.  Small hiatal hernia.  Status post gastric biopsy, negative H.pylori  . ESOPHAGOGASTRODUODENOSCOPY (EGD) WITH ESOPHAGEAL DILATION  Dec 2011   Dr. Jena Gauss: non-critical Schatzki's ring s/p dilation with 56 F dilator, small hiatal hernia, otherwise normal  . IR ANGIO EXTERNAL CAROTID SEL EXT CAROTID BILAT MOD SED  03/02/2019  . IR ANGIO INTRA EXTRACRAN  SEL INTERNAL CAROTID BILAT MOD SED  03/02/2019  . IR ANGIO VERTEBRAL SEL VERTEBRAL BILAT MOD SED  03/02/2019  . IR US GUIDE VASC ACCESS RIGHT  03/02/2019  . KNEE ARTHROSCOPY     Bilateral (torn meniscus)  . ROTATOR CUFF REPAIR    . TUBAL LIGATION      Social History   Socioeconomic History  . Marital status: Divorced    Spouse name: Not on file  . Number of children: Not on file  . Years of education: Not on file  . Highest education level: Not on file  Occupational History  . Occupation: retired    Associate Professor: RETIRED    Comment: [employer]  Tobacco Use  . Smoking status: Never Smoker  . Smokeless tobacco: Never Used  Vaping Use  . Vaping Use: Never used  Substance and Sexual Activity  . Alcohol use: No  . Drug use: No  . Sexual activity: Not on file  Other Topics Concern  . Not on file  Social History Narrative  . Not on file   Social Determinants of Health   Financial Resource Strain:   . Difficulty of Paying Living Expenses: Not on file  Food Insecurity:   . Worried About Programme researcher, broadcasting/film/video in the Last Year: Not on file  . Ran Out of Food in the Last Year: Not on file  Transportation Needs:   . Lack of Transportation (Medical): Not on file  . Lack of  Transportation (Non-Medical): Not on file  Physical Activity:   . Days of Exercise per Week: Not on file  . Minutes of Exercise per Session: Not on file  Stress:   . Feeling of Stress : Not on file  Social Connections:   . Frequency of Communication with Friends and Family: Not on file  . Frequency of Social Gatherings with Friends and Family: Not on file  . Attends Religious Services: Not on file  . Active Member of Clubs or Organizations: Not on file  . Attends Banker Meetings: Not on file  . Marital Status: Not on file  Intimate Partner Violence:   . Fear of Current or Ex-Partner: Not on file  . Emotionally Abused: Not on file  . Physically Abused: Not on file  . Sexually Abused:  Not on file   Family History  Problem Relation Age of Onset  . Colon cancer Neg Hx     Current Outpatient Medications  Medication Sig Dispense Refill  . amLODipine (NORVASC) 5 MG tablet Take 1 tablet by mouth daily.  1  . aspirin EC 81 MG tablet Take 81 mg by mouth daily.    Marland Kitchen atorvastatin (LIPITOR) 20 MG tablet Take 20 mg by mouth at bedtime.  1   No current facility-administered medications for this visit.    No Known Allergies   REVIEW OF SYSTEMS:   [X]  denotes positive finding, [ ]  denotes negative finding Cardiac  Comments:  Chest pain or chest pressure:    Shortness of breath upon exertion:    Short of breath when lying flat:    Irregular heart rhythm:        Vascular    Pain in calf, thigh, or hip brought on by ambulation:    Pain in feet at night that wakes you up from your sleep:     Blood clot in your veins:    Leg swelling:         Pulmonary    Oxygen at home:    Productive cough:     Wheezing:         Neurologic    Sudden weakness in arms or legs:     Sudden numbness in arms or legs:     Sudden onset of difficulty speaking or slurred speech:    Temporary loss of vision in one eye:     Problems with dizziness:         Gastrointestinal    Blood in stool:     Vomited blood:         Genitourinary    Burning when urinating:     Blood in urine:        Psychiatric    Major depression:         Hematologic    Bleeding problems:    Problems with blood clotting too easily:        Skin    Rashes or ulcers:        Constitutional    Fever or chills:      PHYSICAL EXAMINATION:  Vitals:   01/06/20 1032 01/06/20 1034  BP: (!) 150/80 (!) 150/76  Pulse: 83   Resp: 20   Temp: 98.2 F (36.8 C)   SpO2: 96%    General:  WDWN in NAD; vital signs documented above Gait: unaided; no ataxia HENT: WNL, normocephalic Pulmonary: normal non-labored breathing , without Rales, rhonchi,  wheezing Cardiac: regular HR, with systolic Murmurs without carotid  bruit Abdomen: soft, NT,  no masses Skin: without rashes Vascular Exam/Pulses: 2+ brachial and radial pulses Extremities: without ischemic changes, without Gangrene , without cellulitis; without open wounds;  Musculoskeletal: no muscle wasting or atrophy  Neurologic: A&O X 3;  No focal weakness or paresthesias are detected. Face symmetrical. Tongue midline. Psychiatric:  The pt has Normal affect.   Non-Invasive Vascular Imaging:   Right Carotid: Velocities in the right ICA are consistent with a 1-39% stenosis.   Left Carotid: Velocities in the left ICA are consistent with a 60-79% stenosis.   Vertebrals: Bilateral vertebral arteries demonstrate antegrade flow.  Subclavians: Normal flow hemodynamics were seen in bilateral subclavian arteries.    ASSESSMENT/PLAN:: 74 y.o. female here for follow up for follow up left carotid artery stenosis. Increase in estimation of left ICA stenosis to 60-79%. Mild right ICA stenosis. No symptoms and neurologically intact. Discussed findings. Reviewed signs and symptoms of stroke/TIA and advised to seek immediate medical attention should these occur. Continue aspirin and statin. Recheck in 6 months with carotid duplex  Milinda Antis, PA-C [facility] [phone]  Clinic MD:   Darrick Penna

## 2020-02-02 ENCOUNTER — Other Ambulatory Visit: Payer: Self-pay

## 2020-02-02 DIAGNOSIS — I6523 Occlusion and stenosis of bilateral carotid arteries: Secondary | ICD-10-CM

## 2020-02-07 ENCOUNTER — Telehealth: Payer: Self-pay

## 2020-02-07 NOTE — Telephone Encounter (Signed)
Lerry Liner from [facility] left vm about patient concerning carotid velocities and that patient needed to be seen within 24 hours. According to the numbers she left, that is what we found on exam on 10/28. She did not say if patient was experiencing symptoms and did not leave a call back number. Attempted to call patient, but UTR. If patient is experiencing symptoms, she should report to the ED. But her carotid duplex is unchanged from our findings and if she is asymptomatic she can follow up with [place] as scheduled in 9 months.

## 2020-02-28 ENCOUNTER — Ambulatory Visit (HOSPITAL_COMMUNITY)
Admission: RE | Admit: 2020-02-28 | Discharge: 2020-02-28 | Disposition: A | Discharge status: Home / Self Care | Payer: Medicare Other | Source: Ambulatory Visit | Attending: Vascular Surgery | Admitting: Vascular Surgery

## 2020-02-28 ENCOUNTER — Other Ambulatory Visit: Payer: Self-pay

## 2020-02-28 ENCOUNTER — Ambulatory Visit (HOSPITAL_COMMUNITY): Payer: Medicare Other

## 2020-02-28 DIAGNOSIS — I672 Cerebral atherosclerosis: Secondary | ICD-10-CM | POA: Diagnosis not present

## 2020-02-28 DIAGNOSIS — G319 Degenerative disease of nervous system, unspecified: Secondary | ICD-10-CM | POA: Diagnosis not present

## 2020-02-28 DIAGNOSIS — I6523 Occlusion and stenosis of bilateral carotid arteries: Secondary | ICD-10-CM | POA: Diagnosis not present

## 2020-02-28 DIAGNOSIS — I6782 Cerebral ischemia: Secondary | ICD-10-CM | POA: Diagnosis not present

## 2020-02-28 LAB — POCT I-STAT CREATININE: Creatinine, Ser: 0.7 mg/dL (ref 0.44–1.00)

## 2020-02-28 MED ORDER — IOHEXOL 350 MG/ML SOLN
75.0000 mL | Freq: Once | INTRAVENOUS | Status: AC | PRN
  Administered 2020-02-28: 75 mL via INTRAVENOUS

## 2020-02-28 NOTE — Progress Notes (Signed)
Difficult IV stick. 1 attempt made with [place] and unsuccessful. IV team paged.

## 2020-03-09 ENCOUNTER — Other Ambulatory Visit: Payer: Self-pay

## 2020-03-09 ENCOUNTER — Ambulatory Visit: Payer: Medicare Other | Admitting: Vascular Surgery

## 2020-03-09 ENCOUNTER — Encounter: Payer: Self-pay | Admitting: Vascular Surgery

## 2020-03-09 VITALS — BP 155/78 | HR 78 | Resp 20 | Ht 61.0 in | Wt 174.0 lb

## 2020-03-09 DIAGNOSIS — I6522 Occlusion and stenosis of left carotid artery: Secondary | ICD-10-CM | POA: Diagnosis not present

## 2020-03-09 NOTE — Progress Notes (Signed)
  Patient name: [name] MRN: [id number] DOB: 07/24/1945 Sex: female   HPI: [name] is a 74 y.o. female, that we have been following for a moderate carotid stenosis.  The patient had become very anxious about her carotid stenosis so a CT angiogram was ordered.  She is here today to discuss results of these.  Previous duplex ultrasound suggested 60 to 80% stenosis.  She has no symptoms of TIA amaurosis or stroke.  She is currently on aspirin and a statin.  Other medical problems include elevated cholesterol hypertension both of which have been stable.  Past Medical History:  Diagnosis Date  . Bacterial overgrowth syndrome    Positive HBT  . Carotid artery occlusion   . GERD (gastroesophageal reflux disease)   . Hypercholesteremia   . Hypertension    Past Surgical History:  Procedure Laterality Date  . ABDOMINAL HYSTERECTOMY    . BACTERIAL OVERGROWTH TEST N/A 01/07/2013   Procedure: BACTERIAL OVERGROWTH TEST;  Surgeon: Robert M Rourk, MD;  [facility]: [facility];  Service: Endoscopy;  Laterality: N/A;  7:30  . Bladder Tack     x 2  . COLONOSCOPY  July 2007   Dr. Jenkins: normal. Due for screening 2017  . ESOPHAGOGASTRODUODENOSCOPY N/A 10/12/2012   RMR: Small inlet patch.  Small hiatal hernia.  Status post gastric biopsy, negative H.pylori  . ESOPHAGOGASTRODUODENOSCOPY (EGD) WITH ESOPHAGEAL DILATION  Dec 2011   Dr. Rourk: non-critical Schatzki's ring s/p dilation with 56 F dilator, small hiatal hernia, otherwise normal  . IR ANGIO EXTERNAL CAROTID SEL EXT CAROTID BILAT MOD SED  03/02/2019  . IR ANGIO INTRA EXTRACRAN SEL INTERNAL CAROTID BILAT MOD SED  03/02/2019  . IR ANGIO VERTEBRAL SEL VERTEBRAL BILAT MOD SED  03/02/2019  . IR US GUIDE VASC ACCESS RIGHT  03/02/2019  . KNEE ARTHROSCOPY     Bilateral (torn meniscus)  . ROTATOR CUFF REPAIR    . TUBAL LIGATION      Family History  Problem Relation Age of Onset  . Colon cancer Neg Hx     SOCIAL HISTORY: Social History    Socioeconomic History  . Marital status: Divorced    Spouse name: Not on file  . Number of children: Not on file  . Years of education: Not on file  . Highest education level: Not on file  Occupational History  . Occupation: retired    Employer: RETIRED    Comment: [employer]  Tobacco Use  . Smoking status: Never Smoker  . Smokeless tobacco: Never Used  Vaping Use  . Vaping Use: Never used  Substance and Sexual Activity  . Alcohol use: No  . Drug use: No  . Sexual activity: Not on file  Other Topics Concern  . Not on file  Social History Narrative  . Not on file   Social Determinants of Health   Financial Resource Strain: Not on file  Food Insecurity: Not on file  Transportation Needs: Not on file  Physical Activity: Not on file  Stress: Not on file  Social Connections: Not on file  Intimate Partner Violence: Not on file    No Known Allergies  Current Outpatient Medications  Medication Sig Dispense Refill  . amLODipine (NORVASC) 5 MG tablet Take 1 tablet by mouth daily.  1  . aspirin EC 81 MG tablet Take 81 mg by mouth daily.    . atorvastatin (LIPITOR) 20 MG tablet Take 20 mg by mouth at bedtime.  1   No   current facility-administered medications for this visit.    ROS:   General:  No weight loss, Fever, chills  HEENT: No recent headaches, no nasal bleeding, no visual changes, no sore throat  Neurologic: No dizziness, blackouts, seizures. No recent symptoms of stroke or mini- stroke. No recent episodes of slurred speech, or temporary blindness.  Cardiac: No recent episodes of chest pain/pressure, no shortness of breath at rest.  No shortness of breath with exertion.  Denies history of atrial fibrillation or irregular heartbeat  Vascular: No history of rest pain in feet.  No history of claudication.  No history of non-healing ulcer, No history of DVT   Pulmonary: No home oxygen, no productive cough, no hemoptysis,  No asthma or  wheezing  Musculoskeletal:  [ ]  Arthritis, [ ]  Low back pain,  [ ]  Joint pain  Hematologic:No history of hypercoagulable state.  No history of easy bleeding.  No history of anemia  Gastrointestinal: No hematochezia or melena,  No gastroesophageal reflux, no trouble swallowing  Urinary: [ ]  chronic Kidney disease, [ ]  on HD - [ ]  MWF or [ ]  TTHS, [ ]  Burning with urination, [ ]  Frequent urination, [ ]  Difficulty urinating;   Skin: No rashes  Psychological: No history of anxiety,  No history of depression   Physical Examination  Vitals:   03/09/20 1247 03/09/20 1249  BP: (!) 145/80 (!) 155/78  Pulse: 78   Resp: 20   SpO2: 99%   Weight: 182 lb (82.6 kg)   Height: 5\' 1"  (1.549 m)     Body mass index is 34.39 kg/m.  General:  Alert and oriented, no acute distress HEENT: Normal Neck: No JVD Cardiac: Regular Rate and Rhythm Skin: No rash Extremity Pulses:  2+ radial, brachial pulses bilaterally Musculoskeletal: No deformity or edema  Neurologic: Upper and lower extremity motor 5/5 and symmetric  DATA:  I reviewed the images of the patient's recent CT angiogram.  There is minimal right-sided stenosis.  There is a 75% moderately to severely calcified stenosis of the left internal carotid artery.  There is no intracranial occlusive disease.  Carotid bifurcation is just slightly below the hyoid with the lesion extending a few millimeters.  ASSESSMENT: Had a lengthy discussion with the patient today.  Her risk of stroke over the next year is 5% and subsequently 5 %/year.  Also discussed with her that we could continue medical management but she wishes to proceed with carotid endarterectomy at this point.  I also discussed with her ability of carotid stenting but I believe the lesion is more calcified and probably would be more amenable to carotid endarterectomy.  We discussed the risk benefits possible complications of procedure details include but not limited to bleeding infection  cranial nerve injury stroke risk of 1%.  She understands and agrees to proceed.   PLAN: 1.  Cardiology evaluation in [place] preoperative to make sure that she does not have significant coronary disease.  Left carotid endarterectomy scheduled for March 27, 2020.   , MD [facility] Office: [phone]

## 2020-03-09 NOTE — H&P (View-Only) (Signed)
Patient name: [name] MRN: [id number] DOB: 12/21/1945 Sex: female   HPI: [name] is a 74 y.o. female, that we have been following for a moderate carotid stenosis.  The patient had become very anxious about her carotid stenosis so a CT angiogram was ordered.  She is here today to discuss results of these.  Previous duplex ultrasound suggested 60 to 80% stenosis.  She has no symptoms of TIA amaurosis or stroke.  She is currently on aspirin and a statin.  Other medical problems include elevated cholesterol hypertension both of which have been stable.  Past Medical History:  Diagnosis Date  . Bacterial overgrowth syndrome    Positive HBT  . Carotid artery occlusion   . GERD (gastroesophageal reflux disease)   . Hypercholesteremia   . Hypertension    Past Surgical History:  Procedure Laterality Date  . ABDOMINAL HYSTERECTOMY    . BACTERIAL OVERGROWTH TEST N/A 01/07/2013   Procedure: BACTERIAL OVERGROWTH TEST;  Surgeon: Corbin Ade, MD;  [facility];  Service: Endoscopy;  Laterality: N/A;  7:30  . Bladder Tack     x 2  . COLONOSCOPY  July 2007   Dr. Lovell Sheehan: normal. Due for screening 2017  . ESOPHAGOGASTRODUODENOSCOPY N/A 10/12/2012   RMR: Small inlet patch.  Small hiatal hernia.  Status post gastric biopsy, negative H.pylori  . ESOPHAGOGASTRODUODENOSCOPY (EGD) WITH ESOPHAGEAL DILATION  Dec 2011   Dr. Jena Gauss: non-critical Schatzki's ring s/p dilation with 56 F dilator, small hiatal hernia, otherwise normal  . IR ANGIO EXTERNAL CAROTID SEL EXT CAROTID BILAT MOD SED  03/02/2019  . IR ANGIO INTRA EXTRACRAN SEL INTERNAL CAROTID BILAT MOD SED  03/02/2019  . IR ANGIO VERTEBRAL SEL VERTEBRAL BILAT MOD SED  03/02/2019  . IR US GUIDE VASC ACCESS RIGHT  03/02/2019  . KNEE ARTHROSCOPY     Bilateral (torn meniscus)  . ROTATOR CUFF REPAIR    . TUBAL LIGATION      Family History  Problem Relation Age of Onset  . Colon cancer Neg Hx     SOCIAL HISTORY: Social History    Socioeconomic History  . Marital status: Divorced    Spouse name: Not on file  . Number of children: Not on file  . Years of education: Not on file  . Highest education level: Not on file  Occupational History  . Occupation: retired    Associate Professor: RETIRED    Comment: [employer]  Tobacco Use  . Smoking status: Never Smoker  . Smokeless tobacco: Never Used  Vaping Use  . Vaping Use: Never used  Substance and Sexual Activity  . Alcohol use: No  . Drug use: No  . Sexual activity: Not on file  Other Topics Concern  . Not on file  Social History Narrative  . Not on file   Social Determinants of Health   Financial Resource Strain: Not on file  Food Insecurity: Not on file  Transportation Needs: Not on file  Physical Activity: Not on file  Stress: Not on file  Social Connections: Not on file  Intimate Partner Violence: Not on file    No Known Allergies  Current Outpatient Medications  Medication Sig Dispense Refill  . amLODipine (NORVASC) 5 MG tablet Take 1 tablet by mouth daily.  1  . aspirin EC 81 MG tablet Take 81 mg by mouth daily.    Marland Kitchen atorvastatin (LIPITOR) 20 MG tablet Take 20 mg by mouth at bedtime.  1   No  current facility-administered medications for this visit.    ROS:   General:  No weight loss, Fever, chills  HEENT: No recent headaches, no nasal bleeding, no visual changes, no sore throat  Neurologic: No dizziness, blackouts, seizures. No recent symptoms of stroke or mini- stroke. No recent episodes of slurred speech, or temporary blindness.  Cardiac: No recent episodes of chest pain/pressure, no shortness of breath at rest.  No shortness of breath with exertion.  Denies history of atrial fibrillation or irregular heartbeat  Vascular: No history of rest pain in feet.  No history of claudication.  No history of non-healing ulcer, No history of DVT   Pulmonary: No home oxygen, no productive cough, no hemoptysis,  No asthma or  wheezing  Musculoskeletal:  [ ]  Arthritis, [ ]  Low back pain,  [ ]  Joint pain  Hematologic:No history of hypercoagulable state.  No history of easy bleeding.  No history of anemia  Gastrointestinal: No hematochezia or melena,  No gastroesophageal reflux, no trouble swallowing  Urinary: [ ]  chronic Kidney disease, [ ]  on HD - [ ]  MWF or [ ]  TTHS, [ ]  Burning with urination, [ ]  Frequent urination, [ ]  Difficulty urinating;   Skin: No rashes  Psychological: No history of anxiety,  No history of depression   Physical Examination  Vitals:   03/09/20 1247 03/09/20 1249  BP: (!) 145/80 (!) 155/78  Pulse: 78   Resp: 20   SpO2: 99%   Weight: 182 lb (82.6 kg)   Height: 5\' 1"  (1.549 m)     Body mass index is 34.39 kg/m.  General:  Alert and oriented, no acute distress HEENT: Normal Neck: No JVD Cardiac: Regular Rate and Rhythm Skin: No rash Extremity Pulses:  2+ radial, brachial pulses bilaterally Musculoskeletal: No deformity or edema  Neurologic: Upper and lower extremity motor 5/5 and symmetric  DATA:  I reviewed the images of the patient's recent CT angiogram.  There is minimal right-sided stenosis.  There is a 75% moderately to severely calcified stenosis of the left internal carotid artery.  There is no intracranial occlusive disease.  Carotid bifurcation is just slightly below the hyoid with the lesion extending a few millimeters.  ASSESSMENT: Had a lengthy discussion with the patient today.  Her risk of stroke over the next year is 5% and subsequently 5 %/year.  Also discussed with her that we could continue medical management but she wishes to proceed with carotid endarterectomy at this point.  I also discussed with her ability of carotid stenting but I believe the lesion is more calcified and probably would be more amenable to carotid endarterectomy.  We discussed the risk benefits possible complications of procedure details include but not limited to bleeding infection  cranial nerve injury stroke risk of 1%.  She understands and agrees to proceed.   PLAN: 1.  Cardiology evaluation in [place] preoperative to make sure that she does not have significant coronary disease.  Left carotid endarterectomy scheduled for March 27, 2020.   , MD [facility] Office: [phone]

## 2020-03-13 ENCOUNTER — Other Ambulatory Visit: Payer: Self-pay

## 2020-03-13 ENCOUNTER — Encounter: Payer: Self-pay | Admitting: Internal Medicine

## 2020-03-13 ENCOUNTER — Ambulatory Visit: Payer: Medicare Other | Admitting: Internal Medicine

## 2020-03-13 VITALS — BP 146/72 | HR 79 | Ht 61.0 in | Wt 173.8 lb

## 2020-03-13 DIAGNOSIS — I6522 Occlusion and stenosis of left carotid artery: Secondary | ICD-10-CM | POA: Diagnosis not present

## 2020-03-13 DIAGNOSIS — R011 Cardiac murmur, unspecified: Secondary | ICD-10-CM | POA: Diagnosis not present

## 2020-03-13 DIAGNOSIS — Z01818 Encounter for other preprocedural examination: Secondary | ICD-10-CM

## 2020-03-13 DIAGNOSIS — Z01810 Encounter for preprocedural cardiovascular examination: Secondary | ICD-10-CM | POA: Diagnosis not present

## 2020-03-13 DIAGNOSIS — I10 Essential (primary) hypertension: Secondary | ICD-10-CM | POA: Diagnosis not present

## 2020-03-13 DIAGNOSIS — E785 Hyperlipidemia, unspecified: Secondary | ICD-10-CM

## 2020-03-13 NOTE — Patient Instructions (Signed)
Medication Instructions:  No Changes In Medications at this time.  *If you need a refill on your cardiac medications before your next appointment, please call your pharmacy*  Testing/Procedures: Your physician has requested that you have an echocardiogram. Echocardiography is a painless test that uses sound waves to create images of your heart. It provides your doctor with information about the size and shape of your heart and how well your heart's chambers and valves are working. You may receive an ultrasound enhancing agent through an IV if needed to better visualize your heart during the echo.This procedure takes approximately one hour. There are no restrictions for this procedure. This will take place at the [address], [address].   Follow-Up: At [facility], you and your health needs are our priority.  As part of our continuing mission to provide you with exceptional heart care, we have created designated Provider Care Teams.  These Care Teams include your primary Cardiologist (physician) and Advanced Practice Providers (APPs -  Physician Assistants and Nurse Practitioners) who all work together to provide you with the care you need, when you need it.  Your next appointment:   3 month(s)  The format for your next appointment:   In Person  Provider:   Gayatri Acharya, MD 

## 2020-03-13 NOTE — Progress Notes (Signed)
Cardiology Office Note:    Date:  03/13/2020   ID:  [name], DOB Sep 04, 1945, MRN [id number]  PCP:  Carylon Perches, MD  Cardiologist:  No primary care provider on file.  Electrophysiologist:  None   Referring MD: Carylon Perches, MD   Chief Complaint/Reason for Referral: Preoperative CV evaluation  History of Present Illness:    [name] is a 75 y.o. female with a history of L carotid stenosis and left pulsatile tinnitus, HTN, HLD who presents for preoperative cardiovascular evaluation prior to L carotid endarterectomy on Mar 27, 2020.  She feels well overall and has no cardiopulmonary complaints. She is the caretaker for a women, helps with cooking and cleaning. Can work outside without having to stop, does get tired at the end. Able to complete 4 METS  Preoperative cardiovascular evaluation Pertinent past cardiac history: none Prior cardiac workup: none History of valve disease: murmur on exam History of CAD/PAD/CVA/TIA: none History of heart failure: none History of arrhythmia: none On anticoagulation: no Additional history: no anesthesthesia complications. No snoring Current symptoms: none Functional capacity: >4 METS  Past Medical History:  Diagnosis Date  . Bacterial overgrowth syndrome    Positive HBT  . Carotid artery occlusion   . GERD (gastroesophageal reflux disease)   . Hypercholesteremia   . Hypertension     Past Surgical History:  Procedure Laterality Date  . ABDOMINAL HYSTERECTOMY    . BACTERIAL OVERGROWTH TEST N/A 01/07/2013   Procedure: BACTERIAL OVERGROWTH TEST;  Surgeon: Corbin Ade, MD;  Location: [facility];  Service: Endoscopy;  Laterality: N/A;  7:30  . Bladder Tack     x 2  . COLONOSCOPY  July 2007   Dr. Lovell Sheehan: normal. Due for screening 2017  . ESOPHAGOGASTRODUODENOSCOPY N/A 10/12/2012   RMR: Small inlet patch.  Small hiatal hernia.  Status post gastric biopsy, negative H.pylori  . ESOPHAGOGASTRODUODENOSCOPY (EGD) WITH ESOPHAGEAL DILATION   Dec 2011   Dr. Jena Gauss: non-critical Schatzki's ring s/p dilation with 56 F dilator, small hiatal hernia, otherwise normal  . IR ANGIO EXTERNAL CAROTID SEL EXT CAROTID BILAT MOD SED  03/02/2019  . IR ANGIO INTRA EXTRACRAN SEL INTERNAL CAROTID BILAT MOD SED  03/02/2019  . IR ANGIO VERTEBRAL SEL VERTEBRAL BILAT MOD SED  03/02/2019  . IR US GUIDE VASC ACCESS RIGHT  03/02/2019  . KNEE ARTHROSCOPY     Bilateral (torn meniscus)  . ROTATOR CUFF REPAIR    . TUBAL LIGATION      Current Medications: Current Meds  Medication Sig  . amLODipine (NORVASC) 5 MG tablet Take 1 tablet by mouth daily.  Marland Kitchen aspirin EC 81 MG tablet Take 81 mg by mouth daily.  Marland Kitchen atorvastatin (LIPITOR) 20 MG tablet Take 20 mg by mouth at bedtime.  . B Complex-C (B-COMPLEX WITH VITAMIN C) tablet Take 1 tablet by mouth every Monday, Tuesday, Wednesday, Thursday, and Friday.  . esomeprazole (NEXIUM) 20 MG capsule Take 20 mg by mouth daily as needed (acid reflux/indigestion.).     Allergies:   Patient has no known allergies.   Social History   Tobacco Use  . Smoking status: Never Smoker  . Smokeless tobacco: Never Used  Vaping Use  . Vaping Use: Never used  Substance Use Topics  . Alcohol use: No  . Drug use: No     Family History: The patient's family history is negative for Colon cancer.  ROS:   Please see the history of present illness.    All other systems  reviewed and are negative.  EKGs/Labs/Other Studies Reviewed:    The following studies were reviewed today:  EKG:  NSR, rate 79  Recent Labs: 02/28/2020: Creatinine, Ser 0.70  Recent Lipid Panel No results found for: CHOL, TRIG, HDL, CHOLHDL, VLDL, LDLCALC, LDLDIRECT  Physical Exam:    VS:  BP (!) 146/72   Pulse 79   Ht 5\' 1"  (1.549 m)   Wt 173 lb 12.8 oz (78.8 kg)   BMI 32.84 kg/m     Wt Readings from Last 5 Encounters:  03/13/20 173 lb 12.8 oz (78.8 kg)  03/09/20 174 lb (78.9 kg)  01/06/20 181 lb 8 oz (82.3 kg)  03/02/19 185 lb (83.9 kg)   11/19/18 183 lb (83 kg)    Constitutional: No acute distress Eyes: sclera non-icteric, normal conjunctiva and lids ENMT: moist mucous membranes Cardiovascular: regular rhythm, normal rate, 2/6  Systolic ejection murmur, mid peaking. S1 and S2 normal. Radial pulses normal bilaterally. No jugular venous distention.  Respiratory: clear to auscultation bilaterally GI : normal bowel sounds, soft and nontender. No distention.   MSK: extremities warm, well perfused. No edema.  NEURO: grossly nonfocal exam, moves all extremities. PSYCH: alert and oriented x 3, normal mood and affect.   ASSESSMENT:    1. Preoperative cardiovascular examination   2. Systolic murmur   3. Carotid stenosis, left   4. Essential hypertension   5. Hyperlipidemia, unspecified hyperlipidemia type    PLAN:     Pre-op evaluation Carotid stenosis, left Systolic murmur - Plan: EKG 12-Lead, ECHOCARDIOGRAM COMPLETE - will complete an echocardiogram due to systolic murmur and for risk stratification prior to final preoperative risk stratification. She is asymptomatic and can complete 4 METS.   ADDENDUM:  Echo completed showing mild aortic valve stenosis and hyperdynamic LV function.  The patient is intermediate risk for intermediate risk procedure. This risk is non-modifiable at this time.  No further cardiovascular testing is required prior to the procedure.  If this level of risk is acceptable to the patient and surgical team, the patient should be considered optimized from a cardiovascular standpoint. She understands the risk categorization, and is willing to proceed.   Essential hypertension - continue amlodipine 5 mg daily. BP mildly elevated on serial evaluations, will consider adjustments to therapy after surgery.   Hyperlipidemia, unspecified hyperlipidemia type - continue atorvastatin 20 mg daily.  Total time of encounter: 53 minutes total time of encounter, including 30 minutes spent in face-to-face patient  care on the date of this encounter. This time includes coordination of care and counseling regarding above mentioned problem list. Remainder of non-face-to-face time involved reviewing chart documents/testing relevant to the patient encounter and documentation in the medical record. I have independently reviewed documentation from referring provider.   01/19/19, MD [place]  [facility]    Medication Adjustments/Labs and Tests Ordered: Current medicines are reviewed at length with the patient today.  Concerns regarding medicines are outlined above.   Orders Placed This Encounter  Procedures  . EKG 12-Lead  . ECHOCARDIOGRAM COMPLETE   No orders of the defined types were placed in this encounter.   Patient Instructions  Medication Instructions:  No Changes In Medications at this time.  *If you need a refill on your cardiac medications before your next appointment, please call your pharmacy*  Testing/Procedures: Your physician has requested that you have an echocardiogram. Echocardiography is a painless test that uses sound waves to create images of your heart. It provides your doctor with information about  the size and shape of your heart and how well your heart's chambers and valves are working. You may receive an ultrasound enhancing agent through an IV if needed to better visualize your heart during the echo.This procedure takes approximately one hour. There are no restrictions for this procedure. This will take place at the [address], [address].   Follow-Up: At [facility], you and your health needs are our priority.  As part of our continuing mission to provide you with exceptional heart care, we have created designated Provider Care Teams.  These Care Teams include your primary Cardiologist (physician) and Advanced Practice Providers (APPs -  Physician Assistants and Nurse Practitioners) who all work together to provide you with the care you need, when you need  it.  Your next appointment:   3 month(s)  The format for your next appointment:   In Person  Provider:   Cherlynn Kaiser, MD

## 2020-03-15 ENCOUNTER — Ambulatory Visit: Payer: Medicare Other | Admitting: Vascular Surgery

## 2020-03-16 ENCOUNTER — Encounter (HOSPITAL_COMMUNITY): Payer: Self-pay

## 2020-03-16 ENCOUNTER — Encounter (HOSPITAL_COMMUNITY): Payer: Self-pay | Admitting: Internal Medicine

## 2020-03-16 ENCOUNTER — Other Ambulatory Visit (HOSPITAL_COMMUNITY): Payer: Medicare Other

## 2020-03-16 NOTE — Progress Notes (Signed)
Verified appointment "no show" status with AMontez Morita at 13:59.

## 2020-03-20 NOTE — Progress Notes (Addendum)
Your procedure is scheduled on Monday, January 17th.  Report to [facility] Main Entrance "A" at 9:05 A.M., and check in at the Admitting office.  Call this number if you have problems the morning of surgery:  [phone]  Call [phone] if you have any questions prior to your surgery date Monday-Friday 8am-4pm   Remember:  Do not eat or drink after midnight the night before your surgery    Take these medicines the morning of surgery with A SIP OF WATER  amLODipine (NORVASC)   If needed: esomeprazole (NEXIUM)  Follow your surgeon's instructions on when to stop Aspirin.  If no instructions were given by your surgeon then you will need to call the office to get those instructions.    As of today, STOP taking  Aleve, Naproxen, Ibuprofen, Motrin, Advil, Goody's, BC's, all herbal medications, fish oil, and all vitamins.                     Do not wear jewelry, make up, or nail polish            Do not wear lotions, powders, perfumes or deodorant.            Do not shave 48 hours prior to surgery.             Do not bring valuables to the hospital.            [facility] is not responsible for any belongings or valuables.  Do NOT Smoke (Tobacco/Vaping) or drink Alcohol 24 hours prior to your procedure If you use a CPAP at night, you may bring all equipment for your overnight stay.   Contacts, glasses, dentures or bridgework may not be worn into surgery.      For patients admitted to the hospital, discharge time will be determined by your treatment team.   Patients discharged the day of surgery will not be allowed to drive home, and someone needs to stay with them for 24 hours.  Special instructions:   [place]- Preparing For Surgery  Before surgery, you can play an important role. Because skin is not sterile, your skin needs to be as free of germs as possible. You can reduce the number of germs on your skin by washing with CHG (chlorahexidine gluconate) Soap before surgery.  CHG  is an antiseptic cleaner which kills germs and bonds with the skin to continue killing germs even after washing.    Oral Hygiene is also important to reduce your risk of infection.  Remember - BRUSH YOUR TEETH THE MORNING OF SURGERY WITH YOUR REGULAR TOOTHPASTE  Please do not use if you have an allergy to CHG or antibacterial soaps. If your skin becomes reddened/irritated stop using the CHG.  Do not shave (including legs and underarms) for at least 48 hours prior to first CHG shower. It is OK to shave your face.  Please follow these instructions carefully.   1. Shower the NIGHT BEFORE SURGERY and the MORNING OF SURGERY with CHG Soap.   2. If you chose to wash your hair, wash your hair first as usual with your normal shampoo.  3. After you shampoo, rinse your hair and body thoroughly to remove the shampoo.  4. Use CHG as you would any other liquid soap. You can apply CHG directly to the skin and wash gently with a scrungie or a clean washcloth.   5. Apply the CHG Soap to your body ONLY FROM THE NECK DOWN.  Do not use on open wounds or open sores. Avoid contact with your eyes, ears, mouth and genitals (private parts). Wash Face and genitals (private parts)  with your normal soap.   6. Wash thoroughly, paying special attention to the area where your surgery will be performed.  7. Thoroughly rinse your body with warm water from the neck down.  8. DO NOT shower/wash with your normal soap after using and rinsing off the CHG Soap.  9. Pat yourself dry with a CLEAN TOWEL.  10. Wear CLEAN PAJAMAS to bed the night before surgery  11. Place CLEAN SHEETS on your bed the night of your first shower and DO NOT SLEEP WITH PETS.  Day of Surgery: Wear Clean/Comfortable clothing the morning of surgery Do not apply any deodorants/lotions.   Remember to brush your teeth WITH YOUR REGULAR TOOTHPASTE.   Please read over the following fact sheets that you were given.

## 2020-03-21 ENCOUNTER — Encounter (HOSPITAL_COMMUNITY)
Admission: RE | Admit: 2020-03-21 | Discharge: 2020-03-21 | Disposition: A | Discharge status: Home / Self Care | Payer: Medicare Other | Source: Ambulatory Visit | Attending: Vascular Surgery | Admitting: Vascular Surgery

## 2020-03-21 ENCOUNTER — Other Ambulatory Visit: Payer: Self-pay

## 2020-03-21 ENCOUNTER — Ambulatory Visit (HOSPITAL_BASED_OUTPATIENT_CLINIC_OR_DEPARTMENT_OTHER): Payer: Medicare Other

## 2020-03-21 ENCOUNTER — Encounter (HOSPITAL_COMMUNITY): Payer: Self-pay

## 2020-03-21 DIAGNOSIS — Z01810 Encounter for preprocedural cardiovascular examination: Secondary | ICD-10-CM | POA: Insufficient documentation

## 2020-03-21 DIAGNOSIS — I10 Essential (primary) hypertension: Secondary | ICD-10-CM | POA: Insufficient documentation

## 2020-03-21 DIAGNOSIS — I350 Nonrheumatic aortic (valve) stenosis: Secondary | ICD-10-CM | POA: Insufficient documentation

## 2020-03-21 DIAGNOSIS — I700 Atherosclerosis of aorta: Secondary | ICD-10-CM | POA: Insufficient documentation

## 2020-03-21 DIAGNOSIS — R011 Cardiac murmur, unspecified: Secondary | ICD-10-CM | POA: Insufficient documentation

## 2020-03-21 DIAGNOSIS — E785 Hyperlipidemia, unspecified: Secondary | ICD-10-CM | POA: Insufficient documentation

## 2020-03-21 DIAGNOSIS — Z01818 Encounter for other preprocedural examination: Secondary | ICD-10-CM

## 2020-03-21 HISTORY — DX: Cardiac murmur, unspecified: R01.1

## 2020-03-21 LAB — SURGICAL PCR SCREEN
MRSA, PCR: NEGATIVE
Staphylococcus aureus: NEGATIVE

## 2020-03-21 LAB — CBC
HCT: 44 % (ref 36.0–46.0)
Hemoglobin: 14.5 g/dL (ref 12.0–15.0)
MCH: 30 pg (ref 26.0–34.0)
MCHC: 33 g/dL (ref 30.0–36.0)
MCV: 90.9 fL (ref 80.0–100.0)
Platelets: 294 10*3/uL (ref 150–400)
RBC: 4.84 MIL/uL (ref 3.87–5.11)
RDW: 12.3 % (ref 11.5–15.5)
WBC: 9.7 10*3/uL (ref 4.0–10.5)
nRBC: 0 % (ref 0.0–0.2)

## 2020-03-21 LAB — URINALYSIS, ROUTINE W REFLEX MICROSCOPIC
Bilirubin Urine: NEGATIVE
Glucose, UA: NEGATIVE mg/dL
Hgb urine dipstick: NEGATIVE
Ketones, ur: NEGATIVE mg/dL
Nitrite: NEGATIVE
Protein, ur: NEGATIVE mg/dL
Specific Gravity, Urine: 1.01 (ref 1.005–1.030)
pH: 5.5 (ref 5.0–8.0)

## 2020-03-21 LAB — TYPE AND SCREEN
ABO/RH(D): A NEG
Antibody Screen: NEGATIVE

## 2020-03-21 LAB — COMPREHENSIVE METABOLIC PANEL
ALT: 24 U/L (ref 0–44)
AST: 18 U/L (ref 15–41)
Albumin: 4 g/dL (ref 3.5–5.0)
Alkaline Phosphatase: 95 U/L (ref 38–126)
Anion gap: 10 (ref 5–15)
BUN: 8 mg/dL (ref 8–23)
CO2: 25 mmol/L (ref 22–32)
Calcium: 9.4 mg/dL (ref 8.9–10.3)
Chloride: 105 mmol/L (ref 98–111)
Creatinine, Ser: 0.77 mg/dL (ref 0.44–1.00)
GFR, Estimated: >60 mL/min (ref >60)
Glucose, Bld: 109 mg/dL — ABNORMAL HIGH (ref 70–99)
Potassium: 3.8 mmol/L (ref 3.5–5.1)
Sodium: 140 mmol/L (ref 135–145)
Total Bilirubin: 0.8 mg/dL (ref 0.3–1.2)
Total Protein: 7 g/dL (ref 6.5–8.1)

## 2020-03-21 LAB — ECHOCARDIOGRAM COMPLETE
AR max vel: 1.69 cm2
AV Area VTI: 2.17 cm2
AV Area mean vel: 1.73 cm2
AV Mean grad: 11 mmHg
AV Peak grad: 20.1 mmHg
Ao pk vel: 2.24 m/s
Area-P 1/2: 4.39 cm2
S' Lateral: 2.5 cm

## 2020-03-21 LAB — URINALYSIS, MICROSCOPIC (REFLEX)

## 2020-03-21 LAB — PROTIME-INR
INR: 0.9 (ref 0.8–1.2)
Prothrombin Time: 12.1 seconds (ref 11.4–15.2)

## 2020-03-21 LAB — APTT: aPTT: 23 seconds — ABNORMAL LOW (ref 24–36)

## 2020-03-21 NOTE — Progress Notes (Signed)
Spoke with Trinna Post at Dr. Darrick Penna office and notified her of urinalysis results. (small amount of leukocytes and rare amount of bacteria).

## 2020-03-21 NOTE — Progress Notes (Signed)
PCP - Carylon Perches Cardiologist - Weston Brass  PPM/ICD - denies   Chest x-ray - n/a EKG - 03/13/2020 Stress Test - 20-30 years ago (normal) ECHO - 03/21/20 Cardiac Cath - denies  Sleep Study - no    Patient instructed to hold all NSAID's, herbal medications, fish oil and vitamins 7 days prior to surgery. Office instructed patient to continue taking aspirin through the day of surgery.   ERAS Protocol -no   COVID TEST- 03/24/2020   Anesthesia review: yes, pt had normal stress test 20-30 years ago. Completed an echocardiogram on 03/21/20. Follows a cardiologist reguarly.   Patient denies shortness of breath, fever, cough and chest pain at PAT appointment   All instructions explained to the patient, with a verbal understanding of the material. Patient agrees to go over the instructions while at home for a better understanding. Patient also instructed to self quarantine after being tested for COVID-19. The opportunity to ask questions was provided.

## 2020-03-22 ENCOUNTER — Encounter (HOSPITAL_COMMUNITY): Payer: Self-pay

## 2020-03-22 NOTE — Anesthesia Preprocedure Evaluation (Addendum)
Anesthesia Evaluation  Patient identified by MRN, date of birth, ID band Patient awake    Reviewed: Allergy & Precautions, NPO status , Patient's Chart, lab work & pertinent test results  Airway Mallampati: II  TM Distance: >3 FB Neck ROM: Full    Dental  (+) Dental Advisory Given, Partial Upper, Partial Lower, Chipped,    Pulmonary neg pulmonary ROS,    Pulmonary exam normal breath sounds clear to auscultation       Cardiovascular hypertension, Pt. on medications + Peripheral Vascular Disease  Normal cardiovascular exam Rhythm:Regular Rate:Normal     Neuro/Psych negative neurological ROS     GI/Hepatic Neg liver ROS, GERD  Medicated and Controlled,  Endo/Other  negative endocrine ROSObesity   Renal/GU negative Renal ROS     Musculoskeletal negative musculoskeletal ROS (+)   Abdominal   Peds  Hematology negative hematology ROS (+)   Anesthesia Other Findings Day of surgery medications reviewed with the patient.  Reproductive/Obstetrics                            Anesthesia Physical Anesthesia Plan  ASA: III  Anesthesia Plan: General   Post-op Pain Management:    Induction: Intravenous  PONV Risk Score and Plan: 3 and Propofol infusion, Ondansetron and Dexamethasone  Airway Management Planned: Oral ETT  Additional Equipment: Arterial line  Intra-op Plan:   Post-operative Plan: Extubation in OR  Informed Consent: I have reviewed the patients History and Physical, chart, labs and discussed the procedure including the risks, benefits and alternatives for the proposed anesthesia with the patient or authorized representative who has indicated his/her understanding and acceptance.     Dental advisory given  Plan Discussed with: CRNA  Anesthesia Plan Comments: (PAT note written by Shonna Chock, PA-C. )       Anesthesia Quick Evaluation

## 2020-03-22 NOTE — Progress Notes (Signed)
Anesthesia Chart Review:  Case: [id number] Date/Time: 03/27/20 1050   Procedure: LEFT CAROTID ENDARTERECTOMY (Left )   Anesthesia type: General   Pre-op diagnosis: LEFT CAROTID ARTERY STENOSIS   [facility] ROOM 11 / [facility] OR   Surgeons: Sherren Kerns, MD      DISCUSSION: Patient is a 75 year old female scheduled for the above procedure.  History includes never smoker, hypercholesterolemia, HTN, carotid artery disease, pulsatile tinnitus (normal cerebral angiogram 03/02/19),  murmur, GERD. BMI is consistent with obesity.  She was evaluated by cardiologist Dr. Jacques Navy on 03/13/20. Echo ordered for murmur evaluation and showed mild AS. Follow-up echo in 1-2 years recommended. Patient to continue ASA perioperatively.   Presurgical COVID-19 test is scheduled for 03/24/2020.  Anesthesia team to evaluate on the day of surgery.   VS: BP (!) 158/75   Pulse 90   Temp 36.8 C (Oral)   Resp 19   Ht 5\' 1"  (1.549 m)   Wt 79.2 kg   SpO2 98%   BMI 33.01 kg/m     PROVIDERS: , MD is PCP  Carylon Perches, MD is cardiologist   LABS: Labs reviewed: Acceptable for surgery. (all labs ordered are listed, but only abnormal results are displayed)  Labs Reviewed  COMPREHENSIVE METABOLIC PANEL - Abnormal; Notable for the following components:      Result Value   Glucose, Bld 109 (*)    All other components within normal limits  APTT - Abnormal; Notable for the following components:   aPTT 23 (*)    All other components within normal limits  URINALYSIS, ROUTINE W REFLEX MICROSCOPIC - Abnormal; Notable for the following components:   Leukocytes,Ua SMALL (*)    All other components within normal limits  URINALYSIS, MICROSCOPIC (REFLEX) - Abnormal; Notable for the following components:   Bacteria, UA RARE (*)    All other components within normal limits  SURGICAL PCR SCREEN  CBC  PROTIME-INR  TYPE AND SCREEN     IMAGES: CT Head & CTA head/neck 02/28/20: CT HEAD IMPRESSION: 1.  No acute intracranial abnormality. 2. Age-related cerebral atrophy with mild chronic small vessel ischemic disease, stable. CTA HEAD AND NECK IMPRESSION: 1. Severe approximate 75% stenosis by NASCET criteria at the left carotid bifurcation/proximal left ICA. Area of involvement begins at the bifurcation, and measures approximately 7 mm in length. 2. Additional mild atherosclerotic change about the right carotid bifurcation and proximal right ICA with no more than mild 20% stenosis. 3. Otherwise wide patency of the major arterial vasculature of the head and neck. No large vessel occlusion. No other hemodynamically significant or correctable stenosis.   EKG: 03/13/20: NSR   CV: Echo 03/21/20: IMPRESSIONS  1. The aortic valve is tricuspid. There is mild calcification of the  aortic valve. There is mild thickening of the aortic valve. Aortic valve  regurgitation is not visualized. Mild aortic valve stenosis. Aortic valve  area, by VTI measures 2.17 cm.  Aortic valve mean gradient measures 11.0 mmHg. Aortic valve Vmax measures  2.24 m/s.  2. Left ventricular ejection fraction, by estimation, is 65 to 70%. The  left ventricle has normal function. The left ventricle has no regional  wall motion abnormalities. Left ventricular diastolic parameters were  normal.  3. Right ventricular systolic function is normal. The right ventricular  size is normal. Tricuspid regurgitation signal is inadequate for assessing  PA pressure.  4. The mitral valve is grossly normal. Trivial mitral valve  regurgitation. No evidence of mitral stenosis.  5. The  inferior vena cava is normal in size with greater than 50%  respiratory variability, suggesting right atrial pressure of 3 mmHg.    Carotid US 01/06/20: Summary:  - Right Carotid: Velocities in the right ICA are consistent with a 1-39% stenosis.  - Left Carotid: Velocities in the left ICA are consistent with a 60-79% stenosis.  - Vertebrals:  Bilateral vertebral arteries demonstrate antegrade flow.  - Subclavians: Normal flow hemodynamics were seen in bilateral subclavian arteries.    Past Medical History:  Diagnosis Date  . Bacterial overgrowth syndrome    Positive HBT  . Carotid artery occlusion   . GERD (gastroesophageal reflux disease)   . Heart murmur    mild AS 03/21/20 echo  . Hypercholesteremia   . Hypertension     Past Surgical History:  Procedure Laterality Date  . ABDOMINAL HYSTERECTOMY    . BACTERIAL OVERGROWTH TEST N/A 01/07/2013   Procedure: BACTERIAL OVERGROWTH TEST;  Surgeon: Corbin Ade, MD;  Location: [facility];  Service: Endoscopy;  Laterality: N/A;  7:30  . Bladder Tack     x 2  . COLONOSCOPY  July 2007   Dr. Lovell Sheehan: normal. Due for screening 2017  . ESOPHAGOGASTRODUODENOSCOPY N/A 10/12/2012   RMR: Small inlet patch.  Small hiatal hernia.  Status post gastric biopsy, negative H.pylori  . ESOPHAGOGASTRODUODENOSCOPY (EGD) WITH ESOPHAGEAL DILATION  Dec 2011   Dr. Jena Gauss: non-critical Schatzki's ring s/p dilation with 56 F dilator, small hiatal hernia, otherwise normal  . FRACTURE SURGERY     left wrist  . IR ANGIO EXTERNAL CAROTID SEL EXT CAROTID BILAT MOD SED  03/02/2019  . IR ANGIO INTRA EXTRACRAN SEL INTERNAL CAROTID BILAT MOD SED  03/02/2019  . IR ANGIO VERTEBRAL SEL VERTEBRAL BILAT MOD SED  03/02/2019  . IR US GUIDE VASC ACCESS RIGHT  03/02/2019  . KNEE ARTHROSCOPY     Bilateral (torn meniscus)  . ROTATOR CUFF REPAIR    . TUBAL LIGATION      MEDICATIONS: . amLODipine (NORVASC) 5 MG tablet  . aspirin EC 81 MG tablet  . atorvastatin (LIPITOR) 20 MG tablet  . B Complex-C (B-COMPLEX WITH VITAMIN C) tablet  . esomeprazole (NEXIUM) 20 MG capsule   No current facility-administered medications for this encounter.    Shonna Chock, PA-C Surgical Short Stay/Anesthesiology [facility] Phone [phone] [facility] Phone [phone] 03/22/2020 3:29 PM

## 2020-03-24 ENCOUNTER — Other Ambulatory Visit
Admission: RE | Admit: 2020-03-24 | Discharge: 2020-03-24 | Disposition: A | Discharge status: Home / Self Care | Payer: Medicare Other | Source: Ambulatory Visit | Attending: Vascular Surgery | Admitting: Vascular Surgery

## 2020-03-24 ENCOUNTER — Other Ambulatory Visit: Payer: Self-pay

## 2020-03-24 DIAGNOSIS — Z01812 Encounter for preprocedural laboratory examination: Secondary | ICD-10-CM | POA: Diagnosis not present

## 2020-03-24 DIAGNOSIS — Z20822 Contact with and (suspected) exposure to covid-19: Secondary | ICD-10-CM | POA: Insufficient documentation

## 2020-03-25 LAB — SARS CORONAVIRUS 2 (TAT 6-24 HRS): SARS Coronavirus 2: NEGATIVE

## 2020-03-27 ENCOUNTER — Encounter (HOSPITAL_COMMUNITY): Payer: Self-pay | Admitting: Vascular Surgery

## 2020-03-27 ENCOUNTER — Other Ambulatory Visit: Payer: Self-pay

## 2020-03-27 ENCOUNTER — Encounter (HOSPITAL_COMMUNITY)
Admission: RE | Disposition: A | Discharge status: Home / Self Care | Payer: Self-pay | Source: Home / Self Care | Attending: Vascular Surgery

## 2020-03-27 ENCOUNTER — Inpatient Hospital Stay (HOSPITAL_COMMUNITY)
Admission: RE | Admit: 2020-03-27 | Discharge: 2020-03-28 | DRG: 039 | Disposition: A | Discharge status: Home / Self Care | Payer: Medicare Other | Attending: Vascular Surgery | Admitting: Vascular Surgery

## 2020-03-27 ENCOUNTER — Inpatient Hospital Stay (HOSPITAL_COMMUNITY): Payer: Medicare Other | Admitting: Anesthesiology

## 2020-03-27 ENCOUNTER — Inpatient Hospital Stay (HOSPITAL_COMMUNITY): Payer: Medicare Other | Admitting: Vascular Surgery

## 2020-03-27 DIAGNOSIS — Z90710 Acquired absence of both cervix and uterus: Secondary | ICD-10-CM | POA: Diagnosis not present

## 2020-03-27 DIAGNOSIS — E785 Hyperlipidemia, unspecified: Secondary | ICD-10-CM | POA: Diagnosis present

## 2020-03-27 DIAGNOSIS — Z7982 Long term (current) use of aspirin: Secondary | ICD-10-CM | POA: Diagnosis not present

## 2020-03-27 DIAGNOSIS — Z20822 Contact with and (suspected) exposure to covid-19: Secondary | ICD-10-CM | POA: Diagnosis not present

## 2020-03-27 DIAGNOSIS — I10 Essential (primary) hypertension: Secondary | ICD-10-CM | POA: Diagnosis not present

## 2020-03-27 DIAGNOSIS — I739 Peripheral vascular disease, unspecified: Secondary | ICD-10-CM | POA: Diagnosis not present

## 2020-03-27 DIAGNOSIS — Z79899 Other long term (current) drug therapy: Secondary | ICD-10-CM

## 2020-03-27 DIAGNOSIS — I6522 Occlusion and stenosis of left carotid artery: Secondary | ICD-10-CM | POA: Diagnosis not present

## 2020-03-27 DIAGNOSIS — K219 Gastro-esophageal reflux disease without esophagitis: Secondary | ICD-10-CM | POA: Diagnosis present

## 2020-03-27 DIAGNOSIS — E7800 Pure hypercholesterolemia, unspecified: Secondary | ICD-10-CM | POA: Diagnosis present

## 2020-03-27 HISTORY — PX: ENDARTERECTOMY: SHX5162

## 2020-03-27 LAB — ABO/RH: ABO/RH(D): A NEG

## 2020-03-27 SURGERY — ENDARTERECTOMY, CAROTID
Anesthesia: General | Site: Neck | Laterality: Left

## 2020-03-27 MED ORDER — FENTANYL CITRATE (PF) 100 MCG/2ML IJ SOLN
25.0000 ug | INTRAMUSCULAR | Status: DC | PRN
  Administered 2020-03-27 (×3): 25 ug via INTRAVENOUS

## 2020-03-27 MED ORDER — OXYCODONE-ACETAMINOPHEN 5-325 MG PO TABS: 1.0000 | ORAL_TABLET | ORAL | Status: DC | PRN

## 2020-03-27 MED ORDER — ACETAMINOPHEN 325 MG PO TABS
325.0000 mg | ORAL_TABLET | ORAL | Status: DC | PRN
  Administered 2020-03-27 – 2020-03-28 (×3): 650 mg via ORAL
  Filled 2020-03-27 (×3): qty 2

## 2020-03-27 MED ORDER — HEPARIN SODIUM (PORCINE) 1000 UNIT/ML IJ SOLN
INTRAMUSCULAR | Status: DC | PRN
  Administered 2020-03-27: 8000 [IU] via INTRAVENOUS

## 2020-03-27 MED ORDER — ATORVASTATIN CALCIUM 10 MG PO TABS
20.0000 mg | ORAL_TABLET | Freq: Every day | ORAL | Status: DC
  Administered 2020-03-27: 20 mg via ORAL
  Filled 2020-03-27: qty 2

## 2020-03-27 MED ORDER — ONDANSETRON HCL 4 MG/2ML IJ SOLN: 4.0000 mg | Freq: Once | INTRAMUSCULAR | Status: DC | PRN

## 2020-03-27 MED ORDER — LACTATED RINGERS IV SOLN: INTRAVENOUS | Status: DC

## 2020-03-27 MED ORDER — ONDANSETRON HCL 4 MG/2ML IJ SOLN: 4.0000 mg | Freq: Four times a day (QID) | INTRAMUSCULAR | Status: DC | PRN

## 2020-03-27 MED ORDER — SODIUM CHLORIDE 0.9 % IV SOLN: INTRAVENOUS | Status: DC

## 2020-03-27 MED ORDER — SODIUM CHLORIDE 0.9 % IV SOLN
INTRAVENOUS | Status: DC | PRN
  Administered 2020-03-27: 500 mL

## 2020-03-27 MED ORDER — POTASSIUM CHLORIDE CRYS ER 20 MEQ PO TBCR: 20.0000 meq | EXTENDED_RELEASE_TABLET | Freq: Every day | ORAL | Status: DC | PRN

## 2020-03-27 MED ORDER — ONDANSETRON HCL 4 MG/2ML IJ SOLN
INTRAMUSCULAR | Status: DC | PRN
  Administered 2020-03-27: 4 mg via INTRAVENOUS

## 2020-03-27 MED ORDER — ASPIRIN EC 81 MG PO TBEC
81.0000 mg | DELAYED_RELEASE_TABLET | Freq: Every day | ORAL | Status: DC
  Administered 2020-03-28: 81 mg via ORAL
  Filled 2020-03-27: qty 1

## 2020-03-27 MED ORDER — SUGAMMADEX SODIUM 200 MG/2ML IV SOLN
INTRAVENOUS | Status: DC | PRN
  Administered 2020-03-27: 200 mg via INTRAVENOUS

## 2020-03-27 MED ORDER — ALUM & MAG HYDROXIDE-SIMETH 200-200-20 MG/5ML PO SUSP
15.0000 mL | ORAL | Status: DC | PRN
Start: 2020-03-27 — End: 2020-03-28

## 2020-03-27 MED ORDER — PROPOFOL 1000 MG/100ML IV EMUL
INTRAVENOUS | Status: AC
  Filled 2020-03-27: qty 100

## 2020-03-27 MED ORDER — PHENOL 1.4 % MT LIQD: 1.0000 | OROMUCOSAL | Status: DC | PRN

## 2020-03-27 MED ORDER — DEXAMETHASONE SODIUM PHOSPHATE 10 MG/ML IJ SOLN
INTRAMUSCULAR | Status: AC
  Filled 2020-03-27: qty 1

## 2020-03-27 MED ORDER — ORAL CARE MOUTH RINSE: 15.0000 mL | Freq: Once | OROMUCOSAL | Status: AC

## 2020-03-27 MED ORDER — GUAIFENESIN-DM 100-10 MG/5ML PO SYRP: 15.0000 mL | ORAL_SOLUTION | ORAL | Status: DC | PRN

## 2020-03-27 MED ORDER — CHLORHEXIDINE GLUCONATE CLOTH 2 % EX PADS: 6.0000 | MEDICATED_PAD | Freq: Once | CUTANEOUS | Status: DC

## 2020-03-27 MED ORDER — SODIUM CHLORIDE 0.9 % IV SOLN
INTRAVENOUS | Status: AC
  Filled 2020-03-27: qty 1.2

## 2020-03-27 MED ORDER — ROCURONIUM BROMIDE 10 MG/ML (PF) SYRINGE
PREFILLED_SYRINGE | INTRAVENOUS | Status: AC
  Filled 2020-03-27: qty 10

## 2020-03-27 MED ORDER — CHLORHEXIDINE GLUCONATE 0.12 % MT SOLN: 15.0000 mL | Freq: Once | OROMUCOSAL | Status: AC

## 2020-03-27 MED ORDER — PHENYLEPHRINE 40 MCG/ML (10ML) SYRINGE FOR IV PUSH (FOR BLOOD PRESSURE SUPPORT)
PREFILLED_SYRINGE | INTRAVENOUS | Status: DC | PRN
  Administered 2020-03-27: 120 ug via INTRAVENOUS

## 2020-03-27 MED ORDER — PROPOFOL 500 MG/50ML IV EMUL
INTRAVENOUS | Status: DC | PRN
  Administered 2020-03-27: 150 ug/kg/min via INTRAVENOUS
  Administered 2020-03-27: 105 ug/kg/min via INTRAVENOUS

## 2020-03-27 MED ORDER — 0.9 % SODIUM CHLORIDE (POUR BTL) OPTIME
TOPICAL | Status: DC | PRN
  Administered 2020-03-27 (×2): 1000 mL

## 2020-03-27 MED ORDER — BISACODYL 10 MG RE SUPP: 10.0000 mg | Freq: Every day | RECTAL | Status: DC | PRN

## 2020-03-27 MED ORDER — PROTAMINE SULFATE 10 MG/ML IV SOLN
INTRAVENOUS | Status: DC | PRN
  Administered 2020-03-27: 80 mg via INTRAVENOUS

## 2020-03-27 MED ORDER — POLYETHYLENE GLYCOL 3350 17 G PO PACK: 17.0000 g | PACK | Freq: Every day | ORAL | Status: DC | PRN

## 2020-03-27 MED ORDER — ACETAMINOPHEN 500 MG PO TABS
1000.0000 mg | ORAL_TABLET | Freq: Once | ORAL | Status: AC
  Administered 2020-03-27: 1000 mg via ORAL
  Filled 2020-03-27: qty 2

## 2020-03-27 MED ORDER — LABETALOL HCL 5 MG/ML IV SOLN: 10.0000 mg | INTRAVENOUS | Status: DC | PRN

## 2020-03-27 MED ORDER — KETAMINE HCL 50 MG/5ML IJ SOSY
PREFILLED_SYRINGE | INTRAMUSCULAR | Status: AC
  Filled 2020-03-27: qty 5

## 2020-03-27 MED ORDER — PHENYLEPHRINE HCL-NACL 10-0.9 MG/250ML-% IV SOLN
INTRAVENOUS | Status: DC | PRN
  Administered 2020-03-27: 30 ug/min via INTRAVENOUS

## 2020-03-27 MED ORDER — AMLODIPINE BESYLATE 5 MG PO TABS
5.0000 mg | ORAL_TABLET | Freq: Every day | ORAL | Status: DC
  Administered 2020-03-28: 5 mg via ORAL
  Filled 2020-03-27: qty 1

## 2020-03-27 MED ORDER — PROTAMINE SULFATE 10 MG/ML IV SOLN
INTRAVENOUS | Status: AC
  Filled 2020-03-27: qty 10

## 2020-03-27 MED ORDER — DOCUSATE SODIUM 100 MG PO CAPS
100.0000 mg | ORAL_CAPSULE | Freq: Every day | ORAL | Status: DC
  Filled 2020-03-27: qty 1

## 2020-03-27 MED ORDER — LIDOCAINE 2% (20 MG/ML) 5 ML SYRINGE
INTRAMUSCULAR | Status: DC | PRN
  Administered 2020-03-27: 80 mg via INTRAVENOUS

## 2020-03-27 MED ORDER — METOPROLOL TARTRATE 5 MG/5ML IV SOLN: 2.0000 mg | INTRAVENOUS | Status: DC | PRN

## 2020-03-27 MED ORDER — SODIUM CHLORIDE 0.9 % IV SOLN: 500.0000 mL | Freq: Once | INTRAVENOUS | Status: DC | PRN

## 2020-03-27 MED ORDER — CHLORHEXIDINE GLUCONATE 0.12 % MT SOLN
OROMUCOSAL | Status: AC
  Administered 2020-03-27: 15 mL via OROMUCOSAL
  Filled 2020-03-27: qty 15

## 2020-03-27 MED ORDER — LIDOCAINE HCL (PF) 1 % IJ SOLN
INTRAMUSCULAR | Status: AC
  Filled 2020-03-27: qty 30

## 2020-03-27 MED ORDER — ESMOLOL HCL 100 MG/10ML IV SOLN
INTRAVENOUS | Status: AC
  Filled 2020-03-27: qty 10

## 2020-03-27 MED ORDER — PHENYLEPHRINE HCL-NACL 10-0.9 MG/250ML-% IV SOLN
INTRAVENOUS | Status: AC
  Filled 2020-03-27: qty 250

## 2020-03-27 MED ORDER — PANTOPRAZOLE SODIUM 40 MG PO TBEC
40.0000 mg | DELAYED_RELEASE_TABLET | Freq: Every day | ORAL | Status: DC
  Administered 2020-03-27 – 2020-03-28 (×2): 40 mg via ORAL
  Filled 2020-03-27 (×2): qty 1

## 2020-03-27 MED ORDER — ROCURONIUM BROMIDE 100 MG/10ML IV SOLN
INTRAVENOUS | Status: DC | PRN
  Administered 2020-03-27: 30 mg via INTRAVENOUS
  Administered 2020-03-27: 20 mg via INTRAVENOUS
  Administered 2020-03-27: 60 mg via INTRAVENOUS

## 2020-03-27 MED ORDER — ONDANSETRON HCL 4 MG/2ML IJ SOLN
INTRAMUSCULAR | Status: AC
  Filled 2020-03-27: qty 2

## 2020-03-27 MED ORDER — PROPOFOL 10 MG/ML IV BOLUS
INTRAVENOUS | Status: AC
  Filled 2020-03-27: qty 20

## 2020-03-27 MED ORDER — MIDAZOLAM HCL 2 MG/2ML IJ SOLN
INTRAMUSCULAR | Status: AC
  Filled 2020-03-27: qty 2

## 2020-03-27 MED ORDER — MAGNESIUM SULFATE 2 GM/50ML IV SOLN: 2.0000 g | Freq: Every day | INTRAVENOUS | Status: DC | PRN

## 2020-03-27 MED ORDER — FENTANYL CITRATE (PF) 250 MCG/5ML IJ SOLN
INTRAMUSCULAR | Status: DC | PRN
  Administered 2020-03-27: 50 ug via INTRAVENOUS
  Administered 2020-03-27: 75 ug via INTRAVENOUS
  Administered 2020-03-27: 25 ug via INTRAVENOUS

## 2020-03-27 MED ORDER — ARTIFICIAL TEARS OPHTHALMIC OINT
TOPICAL_OINTMENT | OPHTHALMIC | Status: AC
  Filled 2020-03-27: qty 3.5

## 2020-03-27 MED ORDER — ACETAMINOPHEN 650 MG RE SUPP: 325.0000 mg | RECTAL | Status: DC | PRN

## 2020-03-27 MED ORDER — FENTANYL CITRATE (PF) 100 MCG/2ML IJ SOLN
INTRAMUSCULAR | Status: AC
  Administered 2020-03-27: 25 ug via INTRAVENOUS
  Filled 2020-03-27: qty 2

## 2020-03-27 MED ORDER — CEFAZOLIN SODIUM-DEXTROSE 2-4 GM/100ML-% IV SOLN
2.0000 g | INTRAVENOUS | Status: AC
  Administered 2020-03-27: 2 g via INTRAVENOUS
  Filled 2020-03-27: qty 100

## 2020-03-27 MED ORDER — HYDRALAZINE HCL 20 MG/ML IJ SOLN: 5.0000 mg | INTRAMUSCULAR | Status: DC | PRN

## 2020-03-27 MED ORDER — FENTANYL CITRATE (PF) 250 MCG/5ML IJ SOLN
INTRAMUSCULAR | Status: AC
  Filled 2020-03-27: qty 5

## 2020-03-27 MED ORDER — CEFAZOLIN SODIUM-DEXTROSE 2-4 GM/100ML-% IV SOLN
2.0000 g | Freq: Three times a day (TID) | INTRAVENOUS | Status: AC
  Administered 2020-03-27 – 2020-03-28 (×2): 2 g via INTRAVENOUS
  Filled 2020-03-27 (×3): qty 100

## 2020-03-27 MED ORDER — MORPHINE SULFATE (PF) 2 MG/ML IV SOLN
2.0000 mg | INTRAVENOUS | Status: DC | PRN
  Administered 2020-03-28: 2 mg via INTRAVENOUS
  Filled 2020-03-27: qty 1

## 2020-03-27 MED ORDER — PROPOFOL 10 MG/ML IV BOLUS
INTRAVENOUS | Status: DC | PRN
  Administered 2020-03-27: 120 mg via INTRAVENOUS

## 2020-03-27 MED ORDER — KETAMINE HCL 10 MG/ML IJ SOLN
INTRAMUSCULAR | Status: DC | PRN
  Administered 2020-03-27: 20 mg via INTRAVENOUS
  Administered 2020-03-27: 10 mg via INTRAVENOUS

## 2020-03-27 MED ORDER — DEXAMETHASONE SODIUM PHOSPHATE 10 MG/ML IJ SOLN
INTRAMUSCULAR | Status: DC | PRN
  Administered 2020-03-27: 8 mg via INTRAVENOUS

## 2020-03-27 MED ORDER — LIDOCAINE 2% (20 MG/ML) 5 ML SYRINGE
INTRAMUSCULAR | Status: AC
  Filled 2020-03-27: qty 5

## 2020-03-27 MED ORDER — HEPARIN SODIUM (PORCINE) 1000 UNIT/ML IJ SOLN
INTRAMUSCULAR | Status: AC
  Filled 2020-03-27: qty 1

## 2020-03-27 MED ORDER — MIDAZOLAM HCL 5 MG/5ML IJ SOLN
INTRAMUSCULAR | Status: DC | PRN
  Administered 2020-03-27 (×2): 1 mg via INTRAVENOUS

## 2020-03-27 SURGICAL SUPPLY — 30 items
CANISTER SUCT 3000ML PPV (MISCELLANEOUS) ×3 IMPLANT
CANNULA VESSEL 3MM 2 BLNT TIP (CANNULA) ×6 IMPLANT
CATH ROBINSON RED A/P 18FR (CATHETERS) ×3 IMPLANT
CLIP VESOCCLUDE MED 6/CT (CLIP) ×3 IMPLANT
CLIP VESOCCLUDE SM WIDE 6/CT (CLIP) ×3 IMPLANT
DECANTER SPIKE VIAL GLASS SM (MISCELLANEOUS) IMPLANT
DERMABOND ADVANCED (GAUZE/BANDAGES/DRESSINGS) ×2
DERMABOND ADVANCED .7 DNX12 (GAUZE/BANDAGES/DRESSINGS) ×1 IMPLANT
ELECT REM PT RETURN 9FT ADLT (ELECTROSURGICAL) ×3
ELECTRODE REM PT RTRN 9FT ADLT (ELECTROSURGICAL) ×1 IMPLANT
GLOVE BIO SURGEON STRL SZ7.5 (GLOVE) ×3 IMPLANT
GOWN STRL REUS W/ TWL LRG LVL3 (GOWN DISPOSABLE) ×3 IMPLANT
GOWN STRL REUS W/TWL LRG LVL3 (GOWN DISPOSABLE) ×9
KIT BASIN OR (CUSTOM PROCEDURE TRAY) ×3 IMPLANT
KIT SHUNT ARGYLE CAROTID ART 6 (VASCULAR PRODUCTS) ×3 IMPLANT
KIT TURNOVER KIT B (KITS) ×3 IMPLANT
LOOP VESSEL MINI RED (MISCELLANEOUS) ×3 IMPLANT
NS IRRIG 1000ML POUR BTL (IV SOLUTION) ×6 IMPLANT
PACK CAROTID (CUSTOM PROCEDURE TRAY) ×3 IMPLANT
PAD ARMBOARD 7.5X6 YLW CONV (MISCELLANEOUS) ×6 IMPLANT
PATCH HEMASHIELD 8X75 (Vascular Products) ×3 IMPLANT
POSITIONER HEAD DONUT 9IN (MISCELLANEOUS) ×3 IMPLANT
SUT PROLENE 6 0 CC (SUTURE) ×3 IMPLANT
SUT SILK 3 0 (SUTURE) ×3
SUT SILK 3-0 18XBRD TIE 12 (SUTURE) ×1 IMPLANT
SUT VIC AB 3-0 SH 27 (SUTURE) ×3
SUT VIC AB 3-0 SH 27X BRD (SUTURE) ×1 IMPLANT
SUT VICRYL 4-0 PS2 18IN ABS (SUTURE) ×6 IMPLANT
TOWEL GREEN STERILE (TOWEL DISPOSABLE) ×3 IMPLANT
WATER STERILE IRR 1000ML POUR (IV SOLUTION) ×3 IMPLANT

## 2020-03-27 NOTE — Anesthesia Postprocedure Evaluation (Signed)
Anesthesia Post Note  Patient: [name]  Procedure(s) Performed: LEFT CAROTID ENDARTERECTOMY (Left Neck)     Patient location during evaluation: PACU Anesthesia Type: General Level of consciousness: awake and alert, oriented and awake Pain management: pain level controlled Vital Signs Assessment: post-procedure vital signs reviewed and stable Respiratory status: spontaneous breathing, nonlabored ventilation, respiratory function stable and patient connected to nasal cannula oxygen Cardiovascular status: blood pressure returned to baseline and stable Postop Assessment: no apparent nausea or vomiting Anesthetic complications: no   No complications documented.  Last Vitals:  Vitals:   03/27/20 1217 03/27/20 1232  BP: (!) 108/47 108/74  Pulse: 70 69  Resp: (!) 33 (!) 27  Temp:    SpO2: 95% 96%    Last Pain:  Vitals:   03/27/20 1232  PainSc: 8                  Cecile Hearing

## 2020-03-27 NOTE — Transfer of Care (Signed)
Immediate Anesthesia Transfer of Care Note  Patient: [name]  Procedure(s) Performed: LEFT CAROTID ENDARTERECTOMY (Left Neck)  Patient Location: PACU  Anesthesia Type:General  Level of Consciousness: awake, drowsy and patient cooperative  Airway & Oxygen Therapy: Patient Spontanous Breathing and Patient connected to nasal cannula oxygen  Post-op Assessment: Report given to RN and Post -op Vital signs reviewed and stable  Post vital signs: Reviewed and stable  Last Vitals:  Vitals Value Taken Time  BP 103/81 03/27/20 1147  Temp 37 C 03/27/20 1147  Pulse 75 03/27/20 1155  Resp 29 03/27/20 1155  SpO2 96 % 03/27/20 1155  Vitals shown include unvalidated device data.  Last Pain:  Vitals:   03/27/20 1147  PainSc: Asleep      Patients Stated Pain Goal: 2 (03/27/20 0748)  Complications: No complications documented.    PERRL, equal grips bilateral, equal plantar flexion.  No facial droop.

## 2020-03-27 NOTE — Discharge Instructions (Signed)
   [facility]  Discharge Instructions   Carotid Surgery  Please refer to the following instructions for your post-procedure care. Your surgeon or physician assistant will discuss any changes with you.  Activity  You are encouraged to walk as much as you can. You can slowly return to normal activities but must avoid strenuous activity and heavy lifting until your doctor tell you it's okay. Avoid activities such as vacuuming or swinging a golf club. You can drive after one week if you are comfortable and you are no longer taking prescription pain medications. It is normal to feel tired for serval weeks after your surgery. It is also normal to have difficulty with sleep habits, eating, and bowel movements after surgery. These will go away with time.  Bathing/Showering  Shower daily after you go home. Do not soak in a bathtub, hot tub, or swim until the incision heals completely.  Incision Care  Shower every day. Clean your incision with mild soap and water. Pat the area dry with a clean towel. You do not need a bandage unless otherwise instructed. Do not apply any ointments or creams to your incision. You may have skin glue on your incision. Do not peel it off. It will come off on its own in about one week. Your incision may feel thickened and raised for several weeks after your surgery. This is normal and the skin will soften over time.   For Men Only: It's okay to shave around the incision but do not shave the incision itself for 2 weeks. It is common to have numbness under your chin that could last for several months.  Diet  Resume your normal diet. There are no special food restrictions following this procedure. A low fat/low cholesterol diet is recommended for all patients with vascular disease. In order to heal from your surgery, it is CRITICAL to get adequate nutrition. Your body requires vitamins, minerals, and protein. Vegetables are the best source of  vitamins and minerals. Vegetables also provide the perfect balance of protein. Processed food has little nutritional value, so try to avoid this.  Medications  Resume taking all of your medications unless your doctor or physician assistant tells you not to. If your incision is causing pain, you may take over-the- counter pain relievers such as acetaminophen (Tylenol). If you were prescribed a stronger pain medication, please be aware these medications can cause nausea and constipation. Prevent nausea by taking the medication with a snack or meal. Avoid constipation by drinking plenty of fluids and eating foods with a high amount of fiber, such as fruits, vegetables, and grains.   Do not take Tylenol if you are taking prescription pain medications.  Follow Up  Our office will schedule a follow up appointment 2-3 weeks following discharge.  Please call us immediately for any of the following conditions  . Increased pain, redness, drainage (pus) from your incision site. . Fever of 101 degrees or higher. . If you should develop stroke (slurred speech, difficulty swallowing, weakness on one side of your body, loss of vision) you should call 911 and go to the nearest emergency room. .  Reduce your risk of vascular disease:  . Stop smoking. If you would like help call [facility] at 1-800-QUIT-NOW (1-800-784-8669) or [facility] at 336-586-4000. . Manage your cholesterol . Maintain a desired weight . Control your diabetes . Keep your blood pressure down .  If you have any questions, please call the office at [phone]. 

## 2020-03-27 NOTE — Anesthesia Procedure Notes (Addendum)
Procedure Name: Intubation Date/Time: 03/27/2020 9:25 AM Performed by: Onnie Boer, CRNA Pre-anesthesia Checklist: Patient identified, Emergency Drugs available, Suction available and Patient being monitored Patient Re-evaluated:Patient Re-evaluated prior to induction Oxygen Delivery Method: Circle system utilized Preoxygenation: Pre-oxygenation with 100% oxygen Induction Type: IV induction Ventilation: Mask ventilation without difficulty Laryngoscope Size: Miller and 2 Grade View: Grade I Tube type: Oral Number of attempts: 1 Airway Equipment and Method: Stylet Placement Confirmation: ETT inserted through vocal cords under direct vision,  positive ETCO2 and breath sounds checked- equal and bilateral Secured at: 22 (@ lip) cm Tube secured with: Tape Dental Injury: Teeth and Oropharynx as per pre-operative assessment

## 2020-03-27 NOTE — Anesthesia Procedure Notes (Signed)
Arterial Line Insertion Start/End1/17/2022 9:03 AM, 03/27/2020 9:10 AM Performed by: Cecile Hearing, MD, Onnie Boer, CRNA, CRNA  Patient location: Pre-op. Preanesthetic checklist: patient identified, IV checked, site marked, risks and benefits discussed, surgical consent, monitors and equipment checked, pre-op evaluation, timeout performed and anesthesia consent Lidocaine 1% used for infiltration and patient sedated Left, radial was placed Catheter size: 20 G Hand hygiene performed , maximum sterile barriers used  and Seldinger technique used Allen's test indicative of satisfactory collateral circulation Attempts: 1 Procedure performed without using ultrasound guided technique. Following insertion, dressing applied and Biopatch. Post procedure assessment: normal and unchanged  Patient tolerated the procedure well with no immediate complications.

## 2020-03-27 NOTE — Progress Notes (Signed)
Pt in PACU following commands moves UE LE symmetrically still sleepy with pinpoint pupils some tremor in right hand that resolves with distraction continue to observe.  Fabienne Bruns, MD [facility] Office: [phone]

## 2020-03-27 NOTE — Op Note (Signed)
Procedure: Left carotid endarterectomy  Preoperative diagnosis: Asymptomatic 75% left internal carotid artery stenosis  Postoperative diagnosis: Same  Anesthesia: General  Assistant: Clotilde Dieter, MD to expedite procedure and creation of anastomosis  Operative findings: Ulcerated friable plaque left carotid bifurcation greater than 75%  Operative details: After pain informed consent, the patient was taken to the operating.  The patient was placed in supine position operating table.  After induction general anesthesia and endotracheal intubation patient's left neck and chest were prepped and draped in usual sterile fashion.  Next an oblique incision was made on the left side of the neck.  This was carried down through the skin subcutaneous tissues and through the platysma.  The internal jugular vein was identified and retracted laterally.  Common carotid artery was dissected free at the base of the incision and umbilical tape placed around this.  It was soft on palpation.  Dissection was then carried up to level the carotid bifurcation.  It was very thickened and calcified on palpation.  The superior thyroid and external carotid arteries were dissected free circumferentially and Vesseloops placed around these.  The hypoglossal nerve was identified and protected.  The vagus nerve was also identified and protected.  Internal carotid artery was dissected free circumferentially and Vesseloops placed around this.  There was not much palpable disease in the origin of the internal carotid artery just after its takeoff.  At this point patient was given 8000 units of intravenous heparin.  After 2 minutes of circulation time the distal internal carotid artery was controlled with a final blood clamp.  The common carotid artery was controlled with peripheral DeBakey clamp and the external carotid artery controlled with a vessel loop.  Longitudinal opening was made in the common carotid artery just below the  bifurcation and carried up into the internal carotid artery origin.  There was a large hemorrhagic thickened plaque with some ulceration at the carotid bifurcation which extended slightly into the internal carotid artery.  10 French shunt was brought up in the operative field and threaded in the distal internal carotid artery and allowed to backbleed thoroughly.  There was pulsatile backbleeding.  The shunt was secured into the common carotid artery with a rummel.  Shunt was then reopened and flow restored to the left side of the brain.  Endarterectomy was being done in a suitable plane carried circumferentially around the common carotid artery and then transected.  It was then lifted up to the carotid bifurcation and a smooth distal endpoint was obtained in the internal carotid artery.  The external carotid artery was endarterectomized by eversion technique.  There was a good endpoint.  All loose debris was removed from the carotid bed.  This was thoroughly irrigated with heparinized saline.  Dacryon patch was then sewn on his patch angioplasty using running 6-0 Prolene suture.  Just prior to completion of the patch the shunt was occluded and brought down from the internal and common carotid arteries and these were occluded with vascular clamps.  The remainder of the anastomosis was completed.  Flow was then first restored retrograde from the external carotid artery and then antegrade from the common carotid to the external carotid artery and after 5 cardiac cycles to the internal carotid artery.  Hemostasis was obtained with direct pressure and 80 mg of protamine.  There was good Doppler flow in the internal and external and common carotid arteries.  The platysma muscle was reapproximated using running 3-0 Vicryl suture.  The skin was closed with  4-0 Vicryl subcuticular stitch.  Dermabond was applied.  Patient tolerated seizure well and there were no complications.  The initial sponge needle count was correct in  the case.  Patient was moving upper and lower extremities symmetrically after extubation in the operating room and taken to the recovery room in stable condition.  Fabienne Bruns, MD [facility] [place] Office: [phone]

## 2020-03-27 NOTE — Interval H&P Note (Signed)
History and Physical Interval Note:  03/27/2020 8:10 AM  [name]  has presented today for surgery, with the diagnosis of LEFT CAROTID ARTERY STENOSIS.  The various methods of treatment have been discussed with the patient and family. After consideration of risks, benefits and other options for treatment, the patient has consented to  Procedure(s): LEFT CAROTID ENDARTERECTOMY (Left) as a surgical intervention.  The patient's history has been reviewed, patient examined, no change in status, stable for surgery.  I have reviewed the patient's chart and labs.  Questions were answered to the patient's satisfaction.     Fabienne Bruns

## 2020-03-28 ENCOUNTER — Encounter (HOSPITAL_COMMUNITY): Payer: Self-pay | Admitting: Vascular Surgery

## 2020-03-28 LAB — BASIC METABOLIC PANEL
Anion gap: 8 (ref 5–15)
BUN: 11 mg/dL (ref 8–23)
CO2: 24 mmol/L (ref 22–32)
Calcium: 8.9 mg/dL (ref 8.9–10.3)
Chloride: 106 mmol/L (ref 98–111)
Creatinine, Ser: 0.81 mg/dL (ref 0.44–1.00)
GFR, Estimated: >60 mL/min (ref >60)
Glucose, Bld: 139 mg/dL — ABNORMAL HIGH (ref 70–99)
Potassium: 3.7 mmol/L (ref 3.5–5.1)
Sodium: 138 mmol/L (ref 135–145)

## 2020-03-28 LAB — CBC
HCT: 35.8 % — ABNORMAL LOW (ref 36.0–46.0)
Hemoglobin: 12.4 g/dL (ref 12.0–15.0)
MCH: 31.2 pg (ref 26.0–34.0)
MCHC: 34.6 g/dL (ref 30.0–36.0)
MCV: 90.2 fL (ref 80.0–100.0)
Platelets: 251 10*3/uL (ref 150–400)
RBC: 3.97 MIL/uL (ref 3.87–5.11)
RDW: 12.4 % (ref 11.5–15.5)
WBC: 11.6 10*3/uL — ABNORMAL HIGH (ref 4.0–10.5)
nRBC: 0 % (ref 0.0–0.2)

## 2020-03-28 MED ORDER — TRAMADOL HCL 50 MG PO TABS: 50.0000 mg | ORAL_TABLET | Freq: Three times a day (TID) | ORAL | 0 refills | Status: DC | PRN

## 2020-03-28 NOTE — Progress Notes (Signed)
Pt ambulated in hallway 363ft. Tolerated well. Returned to [employer]. Call light in reach.  Versie Starks, RN

## 2020-03-28 NOTE — Progress Notes (Addendum)
  Progress Note    03/28/2020 5:50 AM 1 Day Post-Op  Subjective:  Says her throat soreness is better.  Denies trouble swallowing.  Was out of bed yesterday to go to the bathroom, but has not walked in the halls.   Afebrile HR 60's-80's NSR 110's-130's systolic 91% RA  Gtts:  None   Vitals:   03/27/20 2300 03/28/20 0421  BP: (!) 113/59 136/66  Pulse: 74 76  Resp: 19 18  Temp: 98.3 F (36.8 C) 98.2 F (36.8 C)  SpO2: 94% 96%     Physical Exam: Neuro:  In tact;  Tongue with very mild left deviation Lungs:  Non labored Incision:  Clean and dry with mild fullness; unchanged from recovery yesterday.  CBC    Component Value Date/Time   WBC 11.6 (H) 03/28/2020 0209   RBC 3.97 03/28/2020 0209   HGB 12.4 03/28/2020 0209   HCT 35.8 (L) 03/28/2020 0209   PLT 251 03/28/2020 0209   MCV 90.2 03/28/2020 0209   MCH 31.2 03/28/2020 0209   MCHC 34.6 03/28/2020 0209   RDW 12.4 03/28/2020 0209   LYMPHSABS 2.5 03/02/2019 0825   MONOABS 0.4 03/02/2019 0825   EOSABS 0.1 03/02/2019 0825   BASOSABS 0.1 03/02/2019 0825    BMET    Component Value Date/Time   NA 138 03/28/2020 0209   K 3.7 03/28/2020 0209   CL 106 03/28/2020 0209   CO2 24 03/28/2020 0209   GLUCOSE 139 (H) 03/28/2020 0209   BUN 11 03/28/2020 0209   CREATININE 0.81 03/28/2020 0209   CREATININE 0.69 10/06/2012 1540   CALCIUM 8.9 03/28/2020 0209   GFRNONAA >60 03/28/2020 0209   GFRAA >60 03/02/2019 0825     Intake/Output Summary (Last 24 hours) at 03/28/2020 0550 Last data filed at 03/28/2020 0422 Gross per 24 hour  Intake --  Output 215 ml  Net -215 ml     Assessment/Plan:  This is a 75 y.o. female who is s/p left CEA 1 Day Post-Op  -pt is doing well this am. -pt neuro exam is in tact -pt has not ambulated in the hallways but states she did get out of bed yesterday to go to bathroom. Needs to ambulate in halls -pt has voided -f/u with Dr. Darrick Penna in 2-3 weeks.   Doreatha Massed, PA-C [facility] [phone]  Agree with above patient is neurologically intact no significant hematoma left neck we'll arrange follow-up in a few weeks.  Fabienne Bruns, MD [facility] [place] Office: [phone]

## 2020-03-28 NOTE — Progress Notes (Signed)
Patient given discharge instructions. Telemetry box removed, CCMD notified. PIVs removed, sites clean dry and intact. Patients belongings taken down with patient in wheelchair by volunteers to vehicle.  Kenard Gower, RN

## 2020-03-28 NOTE — Discharge Summary (Signed)
Discharge Summary     [name] April 07, 1945 75 y.o. female  [id number]  Admission Date: 03/27/2020  Discharge Date: 03/28/2020  Physician: Sherren Kerns, MD  Admission Diagnosis: Left carotid artery stenosis [I65.22]   HPI:   This is a 75 y.o. female that we have been following for a moderate carotid stenosis.  The patient had become very anxious about her carotid stenosis so a CT angiogram was ordered.  She is here today to discuss results of these.  Previous duplex ultrasound suggested 60 to 80% stenosis.  She has no symptoms of TIA amaurosis or stroke.  She is currently on aspirin and a statin.  Other medical problems include elevated cholesterol hypertension both of which have been stable.  Hospital Course:  The patient was admitted to the hospital and taken to the operating room on 03/27/2020 and underwent left carotid endarterectomy.    Findings: Ulcerated friable plaque left carotid bifurcation greater than 75%  The pt tolerated the procedure well and was transported to the PACU in good condition.  In the recovery room, she was found to have a mild tremor of the right hand but this did resolve when she was seen a little bit later in recovery.  Her neuro exam was also in tact and tongue had slight left deviation.    By POD 1, the pt neuro status is in tact.  Incision with mild fullness and unchanged from recovery room post op.  She continues to have slight left tongue deviation.  She does not have any trouble swallowing.  She has ambulated and voided.     Recent Labs    03/28/20 0209  NA 138  K 3.7  CL 106  CO2 24  GLUCOSE 139*  BUN 11  CALCIUM 8.9   Recent Labs    03/28/20 0209  WBC 11.6*  HGB 12.4  HCT 35.8*  PLT 251   No results for input(s): INR in the last 72 hours.   Discharge Instructions    Discharge patient   Complete by: As directed    Discharge home after she has eaten and walked in hallways.   Discharge disposition: 01-Home or Self  Care   Discharge patient date: 03/28/2020      Discharge Diagnosis:  Left carotid artery stenosis [I65.22]  Secondary Diagnosis: Patient Active Problem List   Diagnosis Date Noted  . Left carotid artery stenosis 03/27/2020  . Abdominal pain, other specified site 02/11/2013  . Cough 02/11/2013  . Abnormal smell 02/11/2013  . Dysgeusia 12/15/2012  . GERD (gastroesophageal reflux disease) 10/08/2012  . Encounter for screening colonoscopy 10/08/2012  . DYSPHAGIA UNSPECIFIED 02/20/2010  . HYPERLIPIDEMIA 02/16/2010  . OVERWEIGHT 02/16/2010  . GASTROESOPHAGEAL REFLUX DISEASE 02/16/2010  . OTHER NONSPECIFIC ABNORMAL SERUM ENZYME LEVELS 02/16/2010   Past Medical History:  Diagnosis Date  . Bacterial overgrowth syndrome    Positive HBT  . Carotid artery occlusion   . GERD (gastroesophageal reflux disease)   . Heart murmur    mild AS 03/21/20 echo  . Hypercholesteremia   . Hypertension     Allergies as of 03/28/2020      Reactions   Oxycodone Other (See Comments)   "makes me deathly sick"      Medication List    TAKE these medications   amLODipine 5 MG tablet Commonly known as: NORVASC Take 1 tablet by mouth daily.   aspirin EC 81 MG tablet Take 81 mg by mouth daily.   atorvastatin 20 MG tablet Commonly  known as: LIPITOR Take 20 mg by mouth at bedtime.   B-complex with vitamin C tablet Take 1 tablet by mouth every Monday, Tuesday, Wednesday, Thursday, and Friday.   esomeprazole 20 MG capsule Commonly known as: NEXIUM Take 20 mg by mouth daily as needed (acid reflux/indigestion.).   traMADol 50 MG tablet Commonly known as: Ultram Take 1 tablet (50 mg total) by mouth every 8 (eight) hours as needed for moderate pain.        [facility] Discharge Instructions Carotid Endarterectomy (CEA)  Please refer to the following instructions for your post-procedure care. Your surgeon or physician assistant will discuss any changes with  you.  Activity  You are encouraged to walk as much as you can. You can slowly return to normal activities but must avoid strenuous activity and heavy lifting until your doctor tell you it's OK. Avoid activities such as vacuuming or swinging a golf club. You can drive after one week if you are comfortable and you are no longer taking prescription pain medications. It is normal to feel tired for serval weeks after your surgery. It is also normal to have difficulty with sleep habits, eating, and bowel movements after surgery. These will go away with time.  Bathing/Showering  You may shower after you come home. Do not soak in a bathtub, hot tub, or swim until the incision heals completely.  Incision Care  Shower every day. Clean your incision with mild soap and water. Pat the area dry with a clean towel. You do not need a bandage unless otherwise instructed. Do not apply any ointments or creams to your incision. You may have skin glue on your incision. Do not peel it off. It will come off on its own in about one week. Your incision may feel thickened and raised for several weeks after your surgery. This is normal and the skin will soften over time. For Men Only: It's OK to shave around the incision but do not shave the incision itself for 2 weeks. It is common to have numbness under your chin that could last for several months.  Diet  Resume your normal diet. There are no special food restrictions following this procedure. A low fat/low cholesterol diet is recommended for all patients with vascular disease. In order to heal from your surgery, it is CRITICAL to get adequate nutrition. Your body requires vitamins, minerals, and protein. Vegetables are the best source of vitamins and minerals. Vegetables also provide the perfect balance of protein. Processed food has little nutritional value, so try to avoid this.  Medications  Resume taking all of your medications unless your doctor or physician  assistant tells you not to.  If your incision is causing pain, you may take over-the- counter pain relievers such as acetaminophen (Tylenol). If you were prescribed a stronger pain medication, please be aware these medications can cause nausea and constipation.  Prevent nausea by taking the medication with a snack or meal. Avoid constipation by drinking plenty of fluids and eating foods with a high amount of fiber, such as fruits, vegetables, and grains.  Do not take Tylenol if you are taking prescription pain medications.  Follow Up  Our office will schedule a follow up appointment 2-3 weeks following discharge.  Please call us immediately for any of the following conditions  . Increased pain, redness, drainage (pus) from your incision site. . Fever of 101 degrees or higher. . If you should develop stroke (slurred speech, difficulty swallowing, weakness  on one side of your body, loss of vision) you should call 911 and go to the nearest emergency room. .  Reduce your risk of vascular disease:  . Stop smoking. If you would like help call [facility] at 1-800-QUIT-NOW ((623)255-8271) or  at 817-075-9795. . Manage your cholesterol . Maintain a desired weight . Control your diabetes . Keep your blood pressure down .  If you have any questions, please call the office at [phone].  Prescriptions given: 1.   Roxicet #8 No Refill  Disposition: home  Patient's condition: is Good  Follow up: 1. Dr. Darrick Penna in 2 weeks.   Doreatha Massed, PA-C [facility] [phone]   --- For [facility] use ---   Modified Rankin score at D/C (0-6): 0  IV medication needed for:  1. Hypertension: No 2. Hypotension: No  Post-op Complications: No  1. Post-op CVA or TIA: No  If yes: Event classification (right eye, left eye, right cortical, left cortical, verterobasilar, other): n/a  If yes: Timing of event (intra-op, <6 hrs post-op, >=6 hrs post-op, unknown):  n/a  2. CN injury: No  If yes: CN n/a injuried   3. Myocardial infarction: No  If yes: Dx by (EKG or clinical, Troponin): n/a  4.  CHF: No  5.  Dysrhythmia (new): No  6. Wound infection: No  7. Reperfusion symptoms: No  8. Return to OR: No  If yes: return to OR for (bleeding, neurologic, other CEA incision, other): n/a  Discharge medications: Statin use:  Yes ASA use:  Yes   Beta blocker use:  No ACE-Inhibitor use:  No  ARB use:  No CCB use: Yes P2Y12 Antagonist use: No, [ ]  Plavix, [ ]  Plasugrel, [ ]  Ticlopinine, [ ]  Ticagrelor, [ ]  Other, [ ]  No for medical reason, [ ]  Non-compliant, [ ]  Not-indicated Anti-coagulant use:  No, [ ]  Warfarin, [ ]  Rivaroxaban, [ ]  Dabigatran,

## 2020-04-10 ENCOUNTER — Encounter: Payer: Medicare Other | Admitting: Vascular Surgery

## 2020-04-14 DIAGNOSIS — I10 Essential (primary) hypertension: Secondary | ICD-10-CM | POA: Diagnosis not present

## 2020-04-14 DIAGNOSIS — K219 Gastro-esophageal reflux disease without esophagitis: Secondary | ICD-10-CM | POA: Diagnosis not present

## 2020-04-14 DIAGNOSIS — Z79899 Other long term (current) drug therapy: Secondary | ICD-10-CM | POA: Diagnosis not present

## 2020-04-14 DIAGNOSIS — I2510 Atherosclerotic heart disease of native coronary artery without angina pectoris: Secondary | ICD-10-CM | POA: Diagnosis not present

## 2020-04-20 ENCOUNTER — Other Ambulatory Visit: Payer: Self-pay

## 2020-04-20 ENCOUNTER — Encounter: Payer: Self-pay | Admitting: Vascular Surgery

## 2020-04-20 ENCOUNTER — Ambulatory Visit (INDEPENDENT_AMBULATORY_CARE_PROVIDER_SITE_OTHER): Payer: Medicare Other | Admitting: Vascular Surgery

## 2020-04-20 VITALS — BP 141/85 | HR 86 | Temp 97.9°F | Resp 20 | Ht 61.0 in | Wt 172.0 lb

## 2020-04-20 DIAGNOSIS — I6522 Occlusion and stenosis of left carotid artery: Secondary | ICD-10-CM

## 2020-04-20 NOTE — Progress Notes (Signed)
Patient is a 75 year old female who returns for postoperative follow-up today.  She underwent left carotid endarterectomy March 27, 2020.  She is currently on aspirin and a statin.  She has had no symptoms of TIA amaurosis or stroke.  She still has some numbness under her left chin area.  She has had no incisional drainage.  Current Outpatient Medications on File Prior to Visit  Medication Sig Dispense Refill  . amLODipine (NORVASC) 5 MG tablet Take 1 tablet by mouth daily.  1  . aspirin EC 81 MG tablet Take 81 mg by mouth daily.    Marland Kitchen atorvastatin (LIPITOR) 20 MG tablet Take 20 mg by mouth at bedtime.  1  . B Complex-C (B-COMPLEX WITH VITAMIN C) tablet Take 1 tablet by mouth every Monday, Tuesday, Wednesday, Thursday, and Friday.    . esomeprazole (NEXIUM) 20 MG capsule Take 20 mg by mouth daily as needed (acid reflux/indigestion.).    Marland Kitchen traMADol (ULTRAM) 50 MG tablet Take 1 tablet (50 mg total) by mouth every 8 (eight) hours as needed for moderate pain. 8 tablet 0   No current facility-administered medications on file prior to visit.   Physical exam:  Vitals:   04/20/20 1452 04/20/20 1454  BP: 135/82 (!) 141/85  Pulse: 86   Resp: 20   Temp: 97.9 F (36.6 C)   SpO2: 96%   Weight: 172 lb (78 kg)   Height: 5\' 1"  (1.549 m)     Neck: Healing left neck incision  Neuro: Symmetric upper extremity and lower extremity motor strength 5/5 no facial asymmetry tongue is midline  Assessment: Doing well status post left carotid endarterectomy  Plan: Patient will follow up with repeat carotid duplex exam in 9 months time.  She will be on aspirin and statin for life.  She will follow up in our APP clinic at her next office visit.   , MD [facility] [place] Office: [phone]

## 2020-04-21 ENCOUNTER — Other Ambulatory Visit: Payer: Self-pay

## 2020-04-21 DIAGNOSIS — I10 Essential (primary) hypertension: Secondary | ICD-10-CM | POA: Diagnosis not present

## 2020-04-21 DIAGNOSIS — E785 Hyperlipidemia, unspecified: Secondary | ICD-10-CM | POA: Diagnosis not present

## 2020-04-21 DIAGNOSIS — I6523 Occlusion and stenosis of bilateral carotid arteries: Secondary | ICD-10-CM

## 2020-04-21 DIAGNOSIS — G451 Carotid artery syndrome (hemispheric): Secondary | ICD-10-CM | POA: Diagnosis not present

## 2020-06-12 ENCOUNTER — Other Ambulatory Visit: Payer: Self-pay

## 2020-06-12 ENCOUNTER — Encounter: Payer: Self-pay | Admitting: Internal Medicine

## 2020-06-12 ENCOUNTER — Ambulatory Visit: Payer: Medicare Other | Admitting: Internal Medicine

## 2020-06-12 VITALS — BP 140/78 | HR 85 | Ht 61.0 in | Wt 174.4 lb

## 2020-06-12 DIAGNOSIS — I6522 Occlusion and stenosis of left carotid artery: Secondary | ICD-10-CM

## 2020-06-12 DIAGNOSIS — R011 Cardiac murmur, unspecified: Secondary | ICD-10-CM | POA: Diagnosis not present

## 2020-06-12 DIAGNOSIS — E785 Hyperlipidemia, unspecified: Secondary | ICD-10-CM

## 2020-06-12 DIAGNOSIS — I350 Nonrheumatic aortic (valve) stenosis: Secondary | ICD-10-CM | POA: Diagnosis not present

## 2020-06-12 DIAGNOSIS — I10 Essential (primary) hypertension: Secondary | ICD-10-CM | POA: Diagnosis not present

## 2020-06-12 NOTE — Progress Notes (Signed)
Cardiology Office Note:    Date:  06/12/2020   ID:  CODA FILLER, DOB 1945-06-18, MRN [id number]  PCP:  Asencion Noble, MD  Cardiologist:  No primary care provider on file.  Electrophysiologist:  None   Referring MD: Asencion Noble, MD   Chief Complaint/Reason for Referral: Follow up  History of Present Illness:    [name] is a 75 y.o. female with a history of L carotid stenosis and left pulsatile tinnitus, HTN, HLD who I initially met for preoperative cardiovascular evaluation prior to L carotid endarterectomy on Mar 27, 2020. She had no cardiopulmonary complaints. She is the caretaker for a women, helps with cooking and cleaning. Can work outside without having to stop, does get tired at the end. Able to complete 4 METS.  She is s/p L carotid endarterectomy. The patient denies chest pain, chest pressure, dyspnea at rest or with exertion, palpitations, PND, orthopnea, or leg swelling. Denies cough, fever, chills. Denies nausea, vomiting. Denies syncope or presyncope. Denies dizziness or lightheadedness.   Past Medical History:  Diagnosis Date   Bacterial overgrowth syndrome    Positive HBT   Carotid artery occlusion    GERD (gastroesophageal reflux disease)    Heart murmur    mild AS 03/21/20 echo   Hypercholesteremia    Hypertension     Past Surgical History:  Procedure Laterality Date   ABDOMINAL HYSTERECTOMY     BACTERIAL OVERGROWTH TEST N/A 01/07/2013   Procedure: BACTERIAL OVERGROWTH TEST;  Surgeon: Daneil Dolin, MD;  [facility];  Service: Endoscopy;  Laterality: N/A;  7:30   Bladder Tack     x 2   COLONOSCOPY  July 2007   Dr. Arnoldo Morale: normal. Due for screening 2017   ENDARTERECTOMY Left 03/27/2020   Procedure: LEFT CAROTID ENDARTERECTOMY;  Surgeon: Elam Dutch, MD;  Location: [facility] OR;  Service: Vascular;  Laterality: Left;   ESOPHAGOGASTRODUODENOSCOPY N/A 10/12/2012   RMR: Small inlet patch.  Small hiatal hernia.  Status post gastric biopsy, negative  H.pylori   ESOPHAGOGASTRODUODENOSCOPY (EGD) WITH ESOPHAGEAL DILATION  Dec 2011   Dr. Gala Romney: non-critical Schatzki's ring s/p dilation with 19 F dilator, small hiatal hernia, otherwise normal   FRACTURE SURGERY     left wrist   IR ANGIO EXTERNAL CAROTID SEL EXT CAROTID BILAT MOD SED  03/02/2019   IR ANGIO INTRA EXTRACRAN SEL INTERNAL CAROTID BILAT MOD SED  03/02/2019   IR ANGIO VERTEBRAL SEL VERTEBRAL BILAT MOD SED  03/02/2019   IR US GUIDE VASC ACCESS RIGHT  03/02/2019   KNEE ARTHROSCOPY     Bilateral (torn meniscus)   ROTATOR CUFF REPAIR     TUBAL LIGATION      Current Medications: Current Meds  Medication Sig   amLODipine (NORVASC) 5 MG tablet Take 1 tablet by mouth daily.   aspirin EC 81 MG tablet Take 81 mg by mouth daily.   atorvastatin (LIPITOR) 20 MG tablet Take 20 mg by mouth at bedtime.   B Complex-C (B-COMPLEX WITH VITAMIN C) tablet Take 1 tablet by mouth every Monday, Tuesday, Wednesday, Thursday, and Friday.   esomeprazole (NEXIUM) 20 MG capsule Take 20 mg by mouth daily as needed (acid reflux/indigestion.).     Allergies:   Oxycodone   Social History   Tobacco Use   Smoking status: Never Smoker   Smokeless tobacco: Never Used  Vaping Use   Vaping Use: Never used  Substance Use Topics   Alcohol use: No   Drug use: No  Family History: The patient's family history is negative for Colon cancer.  ROS:   Please see the history of present illness.    All other systems reviewed and are negative.  EKGs/Labs/Other Studies Reviewed:    The following studies were reviewed today:  EKG:  n/a  I have independently reviewed the images from: no studies in interval.  Recent Labs: 03/21/2020: ALT 24 03/28/2020: BUN 11; Creatinine, Ser 0.81; Hemoglobin 12.4; Platelets 251; Potassium 3.7; Sodium 138  Recent Lipid Panel No results found for: CHOL, TRIG, HDL, CHOLHDL, VLDL, LDLCALC, LDLDIRECT  Physical Exam:    VS:  BP 140/78   Pulse 85   Ht 5' 1" (1.549 m)    Wt 174 lb 6.4 oz (79.1 kg)   SpO2 97%   BMI 32.95 kg/m     Wt Readings from Last 5 Encounters:  06/12/20 174 lb 6.4 oz (79.1 kg)  04/20/20 172 lb (78 kg)  03/27/20 173 lb 1.6 oz (78.5 kg)  03/21/20 174 lb 11.2 oz (79.2 kg)  03/13/20 173 lb 12.8 oz (78.8 kg)    Constitutional: No acute distress Eyes: sclera non-icteric, normal conjunctiva and lids ENMT: normal dentition, moist mucous membranes Cardiovascular: regular rhythm, normal rate, no murmurs. S1 and S2 normal. Radial pulses normal bilaterally. No jugular venous distention.  Respiratory: clear to auscultation bilaterally GI : normal bowel sounds, soft and nontender. No distention.   MSK: extremities warm, well perfused. No edema.  NEURO: grossly nonfocal exam, moves all extremities. PSYCH: alert and oriented x 3, normal mood and affect.   ASSESSMENT:    1. Aortic valve stenosis, etiology of cardiac valve disease unspecified   2. Systolic murmur   3. Carotid stenosis, left   4. Essential hypertension   5. Hyperlipidemia, unspecified hyperlipidemia type    PLAN:    Carotid stenosis, left - s/p CEA, stable.  Systolic murmur  - Echo completed showing mild aortic valve stenosis and hyperdynamic LV function. Follow up echo in 1 year.   Essential hypertension - continue amlodipine 5 mg daily.   Total time of encounter: 20 minutes total time of encounter, including 15 minutes spent in face-to-face patient care on the date of this encounter. This time includes coordination of care and counseling regarding above mentioned problem list. Remainder of non-face-to-face time involved reviewing chart documents/testing relevant to the patient encounter and documentation in the medical record. I have independently reviewed documentation from referring provider.   Cherlynn Kaiser, MD, [facility]    Medication Adjustments/Labs and Tests Ordered: Current medicines are reviewed at length with the patient today.   Concerns regarding medicines are outlined above.   Orders Placed This Encounter  Procedures   ECHOCARDIOGRAM COMPLETE    Shared Decision Making/Informed Consent:       No orders of the defined types were placed in this encounter.   Patient Instructions  Medication Instructions:  No Changes In Medications at this time.  *If you need a refill on your cardiac medications before your next appointment, please call your pharmacy*  Testing/Procedures: Your physician has requested that you have an echocardiogram IN ONE YEAR-SOMEONE WILL REACH OUT TO YOU TO SCHEDULE THIS. Echocardiography is a painless test that uses sound waves to create images of your heart. It provides your doctor with information about the size and shape of your heart and how well your heart's chambers and valves are working. You may receive an ultrasound enhancing agent through an IV if needed to better visualize your  heart during the echo.This procedure takes approximately one hour. There are no restrictions for this procedure. This will take place at the [address], [address].   Follow-Up: At [facility], you and your health needs are our priority.  As part of our continuing mission to provide you with exceptional heart care, we have created designated Provider Care Teams.  These Care Teams include your primary Cardiologist (physician) and Advanced Practice Providers (APPs -  Physician Assistants and Nurse Practitioners) who all work together to provide you with the care you need, when you need it.  Your next appointment:   1 year(s) FOLLOWING ECHO  The format for your next appointment:   In Person  Provider:   Cherlynn Kaiser, MD

## 2020-06-12 NOTE — Patient Instructions (Signed)
Medication Instructions:  No Changes In Medications at this time.  *If you need a refill on your cardiac medications before your next appointment, please call your pharmacy*  Testing/Procedures: Your physician has requested that you have an echocardiogram IN ONE YEAR-SOMEONE WILL REACH OUT TO YOU TO SCHEDULE THIS. Echocardiography is a painless test that uses sound waves to create images of your heart. It provides your doctor with information about the size and shape of your heart and how well your heart's chambers and valves are working. You may receive an ultrasound enhancing agent through an IV if needed to better visualize your heart during the echo.This procedure takes approximately one hour. There are no restrictions for this procedure. This will take place at the [address], [address].   Follow-Up: At [facility] [place], you and your health needs are our priority.  As part of our continuing mission to provide you with exceptional heart care, we have created designated Provider Care Teams.  These Care Teams include your primary Cardiologist (physician) and Advanced Practice Providers (APPs -  Physician Assistants and Nurse Practitioners) who all work together to provide you with the care you need, when you need it.  Your next appointment:   1 year(s) FOLLOWING ECHO  The format for your next appointment:   In Person  Provider:   Weston Brass, MD

## 2021-01-17 DIAGNOSIS — Z23 Encounter for immunization: Secondary | ICD-10-CM | POA: Diagnosis not present

## 2021-02-13 NOTE — Progress Notes (Signed)
Office Note    HPI: [name] is a 75 y.o. (1945/03/25) female presenting at the in follow s/p L CEA with Dr. Darrick Penna on 03/27/20.  Since her surgery, [name] has been doing well.  She feels as though she is back to her normal self.  She denies recent history of TIA, stroke, amaurosis. She denies symptoms of claudication, rest pain, tissue loss of the feet.  The pt is  on a statin for cholesterol management.  The pt is  on a daily aspirin.   Other AC:  - The pt is  on medication for hypertension.   The pt is not diabetic.  Tobacco hx:  -  Past Medical History:  Diagnosis Date   Bacterial overgrowth syndrome    Positive HBT   Carotid artery occlusion    GERD (gastroesophageal reflux disease)    Heart murmur    mild AS 03/21/20 echo   Hypercholesteremia    Hypertension     Past Surgical History:  Procedure Laterality Date   ABDOMINAL HYSTERECTOMY     BACTERIAL OVERGROWTH TEST N/A 01/07/2013   Procedure: BACTERIAL OVERGROWTH TEST;  Surgeon: Corbin Ade, MD;  [facility];  Service: Endoscopy;  Laterality: N/A;  7:30   Bladder Tack     x 2   COLONOSCOPY  July 2007   Dr. Lovell Sheehan: normal. Due for screening 2017   ENDARTERECTOMY Left 03/27/2020   Procedure: LEFT CAROTID ENDARTERECTOMY;  Surgeon: Sherren Kerns, MD;  Location: [facility] OR;  Service: Vascular;  Laterality: Left;   ESOPHAGOGASTRODUODENOSCOPY N/A 10/12/2012   RMR: Small inlet patch.  Small hiatal hernia.  Status post gastric biopsy, negative H.pylori   ESOPHAGOGASTRODUODENOSCOPY (EGD) WITH ESOPHAGEAL DILATION  Dec 2011   Dr. Jena Gauss: non-critical Schatzki's ring s/p dilation with 56 F dilator, small hiatal hernia, otherwise normal   FRACTURE SURGERY     left wrist   IR ANGIO EXTERNAL CAROTID SEL EXT CAROTID BILAT MOD SED  03/02/2019   IR ANGIO INTRA EXTRACRAN SEL INTERNAL CAROTID BILAT MOD SED  03/02/2019   IR ANGIO VERTEBRAL SEL VERTEBRAL BILAT MOD SED  03/02/2019   IR US GUIDE VASC ACCESS RIGHT  03/02/2019    KNEE ARTHROSCOPY     Bilateral (torn meniscus)   ROTATOR CUFF REPAIR     TUBAL LIGATION      Social History   Socioeconomic History   Marital status: Divorced    Spouse name: Not on file   Number of children: Not on file   Years of education: Not on file   Highest education level: Not on file  Occupational History   Occupation: retired    Associate Professor: RETIRED    Comment: [employer]  Tobacco Use   Smoking status: Never   Smokeless tobacco: Never  Building services engineer Use: Never used  Substance and Sexual Activity   Alcohol use: No   Drug use: No   Sexual activity: Not on file  Other Topics Concern   Not on file  Social History Narrative   Not on file   Social Determinants of Health   Financial Resource Strain: Not on file  Food Insecurity: Not on file  Transportation Needs: Not on file  Physical Activity: Not on file  Stress: Not on file  Social Connections: Not on file  Intimate Partner Violence: Not on file    Family History  Problem Relation Age of Onset   Colon cancer Neg Hx     Current Outpatient Medications  Medication Sig Dispense Refill   amLODipine (NORVASC) 5 MG tablet Take 1 tablet by mouth daily.  1   aspirin EC 81 MG tablet Take 81 mg by mouth daily.     atorvastatin (LIPITOR) 20 MG tablet Take 20 mg by mouth at bedtime.  1   B Complex-C (B-COMPLEX WITH VITAMIN C) tablet Take 1 tablet by mouth every Monday, Tuesday, Wednesday, Thursday, and Friday.     esomeprazole (NEXIUM) 20 MG capsule Take 20 mg by mouth daily as needed (acid reflux/indigestion.).     No current facility-administered medications for this visit.    Allergies  Allergen Reactions   Oxycodone Other (See Comments)    "makes me deathly sick"     REVIEW OF SYSTEMS:   [X]  denotes positive finding, [ ]  denotes negative finding Cardiac  Comments:  Chest pain or chest pressure:    Shortness of breath upon exertion:    Short of breath when lying flat:    Irregular  heart rhythm:        Vascular    Pain in calf, thigh, or hip brought on by ambulation:    Pain in feet at night that wakes you up from your sleep:     Blood clot in your veins:    Leg swelling:         Pulmonary    Oxygen at home:    Productive cough:     Wheezing:         Neurologic    Sudden weakness in arms or legs:     Sudden numbness in arms or legs:     Sudden onset of difficulty speaking or slurred speech:    Temporary loss of vision in one eye:     Problems with dizziness:         Gastrointestinal    Blood in stool:     Vomited blood:         Genitourinary    Burning when urinating:     Blood in urine:        Psychiatric    Major depression:         Hematologic    Bleeding problems:    Problems with blood clotting too easily:        Skin    Rashes or ulcers:        Constitutional    Fever or chills:      PHYSICAL EXAMINATION:  There were no vitals filed for this visit.  General:  WDWN in NAD; vital signs documented above Gait: Not observed HENT: WNL, normocephalic Pulmonary: normal non-labored breathing , without wheezing Cardiac: regular HR,  Abdomen: soft, NT, no masses Skin: without rashes Vascular Exam/Pulses:  Right Left  Radial 2+ (normal) 2+ (normal)  Ulnar 2+ (normal) 2+ (normal)                   Extremities: without ischemic changes, without Gangrene , without cellulitis; without open wounds;  Musculoskeletal: no muscle wasting or atrophy  Neurologic: A&O X 3;  No focal weakness or paresthesias are detected Psychiatric:  The pt has Normal affect.   Non-Invasive Vascular Imaging:    Summary:  Right Carotid: Velocities in the right ICA are consistent with a 1-39%  stenosis.   Left Carotid: Velocities in the left ICA are consistent with a 1-39%  stenosis.                Patent CEA.   Vertebrals:  Bilateral vertebral arteries  demonstrate antegrade flow.  Subclavians: Normal flow hemodynamics were seen in bilateral subclavian                arteries.   *See table(s) above for measurements and observations.     ASSESSMENT/PLAN: [name] is a 75 y.o. female presenting status post left CEA with Dr. Darrick Penna On 03/27/2020.  Patient is doing well with no residual stenosis bilaterally.  I asked that she continue her current medication regimen.  We will see her again in 1 year for follow-up carotid duplex ultrasound  She was asked to seek immediate medical attention should symptoms of stroke, TIA, amaurosis present.  With her current level of stenosis, believe the risk of this is very low.   Victorino Sparrow, MD [facility] [phone]

## 2021-02-16 ENCOUNTER — Other Ambulatory Visit: Payer: Self-pay

## 2021-02-16 ENCOUNTER — Encounter: Payer: Self-pay | Admitting: Vascular Surgery

## 2021-02-16 ENCOUNTER — Ambulatory Visit (HOSPITAL_COMMUNITY)
Admission: RE | Admit: 2021-02-16 | Discharge: 2021-02-16 | Disposition: A | Discharge status: Home / Self Care | Payer: Medicare Other | Source: Ambulatory Visit | Attending: Vascular Surgery | Admitting: Vascular Surgery

## 2021-02-16 ENCOUNTER — Ambulatory Visit: Payer: Medicare Other | Admitting: Vascular Surgery

## 2021-02-16 VITALS — BP 144/72 | HR 73 | Resp 14 | Ht 60.0 in | Wt 175.0 lb

## 2021-02-16 DIAGNOSIS — I6523 Occlusion and stenosis of bilateral carotid arteries: Secondary | ICD-10-CM

## 2021-03-16 ENCOUNTER — Other Ambulatory Visit (HOSPITAL_COMMUNITY): Payer: Self-pay | Admitting: Internal Medicine

## 2021-03-16 DIAGNOSIS — Z1231 Encounter for screening mammogram for malignant neoplasm of breast: Secondary | ICD-10-CM

## 2021-03-19 ENCOUNTER — Ambulatory Visit (HOSPITAL_COMMUNITY)
Admission: RE | Admit: 2021-03-19 | Discharge: 2021-03-19 | Disposition: A | Discharge status: Home / Self Care | Payer: Medicare Other | Source: Ambulatory Visit | Attending: Internal Medicine | Admitting: Internal Medicine

## 2021-03-19 ENCOUNTER — Other Ambulatory Visit: Payer: Self-pay

## 2021-03-19 DIAGNOSIS — Z1231 Encounter for screening mammogram for malignant neoplasm of breast: Secondary | ICD-10-CM | POA: Diagnosis not present

## 2021-03-26 ENCOUNTER — Ambulatory Visit (INDEPENDENT_AMBULATORY_CARE_PROVIDER_SITE_OTHER): Payer: Medicare Other

## 2021-03-26 ENCOUNTER — Ambulatory Visit: Payer: Medicare Other | Admitting: Podiatry

## 2021-03-26 ENCOUNTER — Other Ambulatory Visit: Payer: Self-pay

## 2021-03-26 DIAGNOSIS — M2042 Other hammer toe(s) (acquired), left foot: Secondary | ICD-10-CM | POA: Diagnosis not present

## 2021-03-26 DIAGNOSIS — M21619 Bunion of unspecified foot: Secondary | ICD-10-CM

## 2021-03-26 DIAGNOSIS — M79672 Pain in left foot: Secondary | ICD-10-CM | POA: Diagnosis not present

## 2021-03-26 DIAGNOSIS — M205X2 Other deformities of toe(s) (acquired), left foot: Secondary | ICD-10-CM | POA: Diagnosis not present

## 2021-03-26 NOTE — Patient Instructions (Signed)
Pre-Operative Instructions  Congratulations, you have decided to take an important step to improving your quality of life.  You can be assured that the doctors of [facility] will be with you every step of the way.  Plan to be at the surgery center/hospital at least 1 (one) hour prior to your scheduled time unless otherwise directed by the surgical center/hospital staff.  You must have a responsible adult accompany you, remain during the surgery and drive you home.  Make sure you have directions to the surgical center/hospital and know how to get there on time. For hospital based surgery you will need to obtain a history and physical form from your family physician within 1 month prior to the date of surgery- we will give you a form for you primary physician.  We make every effort to accommodate the date you request for surgery.  There are however, times where surgery dates or times have to be moved.  We will contact you as soon as possible if a change in schedule is required.   No Aspirin/Ibuprofen for one week before surgery.  If you are on aspirin, any non-steroidal anti-inflammatory medications (Mobic, Aleve, Ibuprofen) you should stop taking it 7 days prior to your surgery.  You make take Tylenol  For pain prior to surgery.  Medications- If you are taking daily heart and blood pressure medications, seizure, reflux, allergy, asthma, anxiety, pain or diabetes medications, make sure the surgery center/hospital is aware before the day of surgery so they may notify you which medications to take or avoid the day of surgery. No food or drink after midnight the night before surgery unless directed otherwise by surgical center/hospital staff. No alcoholic beverages 24 hours prior to surgery.  No smoking 24 hours prior to or 24 hours after surgery. Wear loose pants or shorts- loose enough to fit over bandages, boots, and casts. No slip on shoes, sneakers are best. Bring your boot with you to the  surgery center/hospital.  Also bring crutches or a walker if your physician has prescribed it for you.  If you do not have this equipment, it will be provided for you after surgery. If you have not been contracted by the surgery center/hospital by the day before your surgery, call to confirm the date and time of your surgery. Leave-time from work may vary depending on the type of surgery you have.  Appropriate arrangements should be made prior to surgery with your employer. Prescriptions will be provided immediately following surgery by your doctor.  Have these filled as soon as possible after surgery and take the medication as directed. Remove nail polish on the operative foot. Wash the night before surgery.  The night before surgery wash the foot and leg well with the antibacterial soap provided and water paying special attention to beneath the toenails and in between the toes.  Rinse thoroughly with water and dry well with a towel.  Perform this wash unless told not to do so by your physician.  Enclosed: 1 Ice pack (please put in freezer the night before surgery)   1 Hibiclens skin cleaner   Pre-op Instructions  If you have any questions regarding the instructions, do not hesitate to call our office at any point during this process.   [place]: [address] [place], [place] [address] [phone]  [place]: [address] [place], [place] [address] [phone]  Dr. Ovid CurdMatthew Latavious Bitter, DPM  Bunion A bunion (hallux valgus) is a bump that forms slowly on the inner side  of the big toe joint. It occurs when the big toe turns toward the second toe. Bunions may be small at first, but they often get larger over time. They can make walking painful. What are the causes? This condition may be caused by: Wearing narrow or pointed shoes that force the big toe to press against the other toes. Abnormal foot development that causes the foot to roll inward. Changes in the foot that are caused by certain  diseases, such as rheumatoid arthritis or polio. A foot injury. What increases the risk? The following factors may make you more likely to develop this condition: Wearing shoes that squeeze the toes together. Having certain diseases, such as: Rheumatoid arthritis. Polio. Cerebral palsy. Having family members who have bunions. Being born with abnormally shaped feet (a foot deformity), such as flat feet or low arches. Doing activities that put a lot of pressure on the feet, such as ballet dancing. What are the signs or symptoms? The main symptom of this condition is a bump on your big toe that you can notice. Other symptoms may include: Pain. Redness and inflammation around your big toe. Thick or hardened skin on your big toe or between your toes. Stiffness or loss of motion in your big toe. Trouble with walking. How is this diagnosed? This condition may be diagnosed based on your symptoms, medical history, and activities. You may also have tests and imaging, such as: X-rays. These allow your health care provider to check the position of the bones in your foot and look for damage to your joint. They also help your health care provider determine the severity of your bunion and the best way to treat it. Joint aspiration. In this test, a sample of fluid is removed from the toe joint. This test may be done if you are in a lot of pain. It helps rule out diseases that cause painful swelling of the joints, such as arthritis or gout. How is this treated? Treatment depends on the severity of your symptoms. The goal of treatment is to relieve symptoms and prevent your bunion from getting worse. Your health care provider may recommend: Wearing shoes that have a wide toe box, or using bunion pads to cushion the affected area. Taping your toes together to keep them in a normal position. Placing a device inside your shoe (orthotic device) to help reduce pressure on your toe joint. Taking medicine to  ease pain and inflammation. Putting ice or heat on the affected area. Doing stretching exercises. Surgery, for severe cases. Follow these instructions at home: Managing pain, stiffness, and swelling   If directed, put ice on the painful area. To do this: Put ice in a plastic bag. Place a towel between your skin and the bag. Leave the ice on for 20 minutes, 2-3 times a day. Remove the ice if your skin turns bright red. This is very important. If you cannot feel pain, heat, or cold, you have a greater risk of damage to the area. If directed, apply heat to the affected area before you exercise. Use the heat source that your health care provider recommends, such as a moist heat pack or a heating pad. Place a towel between your skin and the heat source. Leave the heat on for 20-30 minutes. Remove the heat if your skin turns bright red. This is especially important if you are unable to feel pain, heat, or cold. You have a greater risk of getting burned. General instructions Do exercises as told by  your health care provider. Support your toe joint with proper footwear, shoe padding, or taping as told by your health care provider. Take over-the-counter and prescription medicines only as told by your health care provider. Do not use any products that contain nicotine or tobacco, such as cigarettes, e-cigarettes, and chewing tobacco. If you need help quitting, ask your health care provider. Keep all follow-up visits. This is important. Contact a health care provider if: Your symptoms get worse. Your symptoms do not improve in 2 weeks. Get help right away if: You have severe pain and trouble with walking. Summary A bunion is a bump on the inner side of the big toe joint that forms when the big toe turns toward the second toe. Bunions can make walking painful. Treatment depends on the severity of your symptoms. Support your toe joint with proper footwear, shoe padding, or taping as told by your  health care provider. This information is not intended to replace advice given to you by your health care provider. Make sure you discuss any questions you have with your health care provider. Document Revised: 07/02/2019 Document Reviewed: 07/02/2019 Elsevier Patient Education  2022 Elsevier Inc.  GodfreyHammer Toe Hammer toe is a change in the shape, or a deformity, of the toe. The deformity causes the middle joint of the toe to stay bent. Hammer toe starts gradually. At first, the toe can be straightened. Then over time, the toe deformity becomes stiff, inflexible, and permanently bent. Hammer toe usually affects the second, third, or fourth toe. A hammer toe causes pain, especially when wearing shoes. Corns and calluses can result from the toe rubbing against the inside of the shoe. Early treatments to keep the toe straight may relieve pain. As the deformity of the toe becomes stiff and permanent, surgery may be needed to straighten the toe. What are the causes? This condition is caused by abnormal bending of the toe joint that is closest to your foot. Over time, the toe bending downward pulls on the muscles and connections (tendons) of the toe joint, making them weak and stiff. Wearing shoes that are too narrow in the toe box and do not allow toes to fully straighten can cause this condition. What increases the risk? You are more likely to develop this condition if you: Are an older female. Wear shoes that are too small, or wear high-heeled shoes that pinch your toes. Have a second toe that is longer than your big toe (first toe). Injure your foot or toe. Have arthritis, or have a nerve or muscle disorder. Have diabetes or a condition known as Charcot joint, which may cause you to walk abnormally. Have a family history of hammer toe. Are a Advertising account plannerballet dancer. What are the signs or symptoms? Pain and deformity of the toe are the main symptoms of this condition. The pain is worse when wearing shoes,  walking, or running. Other symptoms may include: A thickened patch of skin, called a corn or callus, that forms over the top of the bent part of the toe or between the toes. Redness and a burning feeling on the bent toe. An open sore that forms on the top of the bent toe. Not being able to straighten the affected toe. How is this diagnosed? This condition is diagnosed based on your symptoms and a physical exam. During the exam, your health care provider will try to straighten your toe to see how stiff the deformity is. You may also have tests, such as: A blood test  to check for rheumatoid arthritis or diabetes. An X-ray to show how severe the toe deformity is. How is this treated? Treatment for this condition depends on whether the toe is flexible or deformed and no longer moveable. In less severe cases, a hammer toe can be straightened without surgery. These treatments include: Taping the toe into a straightened position. Using pads and cushions to protect the bent toe. Wearing shoes that provide enough room for the toes. Doing toe-stretching exercises at home. Taking an NSAID, such as ibuprofen, to reduce pain and swelling. Using special orthotics or insoles for pain relief and to improve walking. If these treatments do not help or the toe has a severe deformity and cannot be straightened, surgery is the next option. The most common surgeries used to straighten a hammer toe include: Arthroplasty or osteotomy. Part of the toe joint is reconstructed or removed, which allows the toe to straighten. Fusion. Cartilage between the two bones of the joint is taken out, and the bones are fused together into one longer bone. Implantation. Part of the bone is removed and replaced with an implant to allow the toe to move again. Flexor tendon transfer. The tendons that curl the toes down (flexor tendons) are repositioned. Follow these instructions at home: Take over-the-counter and prescription medicines  only as told by your health care provider. Do toe-straightening and stretching exercises as told by your health care provider. Keep all follow-up visits. This is important. How is this prevented? Wear shoes that fit properly and give your toes enough room. Shoes should not cause pain. Buy shoes at the end of the day to make sure they fit well, since your foot may swell during the day. Make sure they are comfortable before you buy them. As you age, your shoe size might change, including the width. Measure both feet and buy shoes for the larger foot. A shoe repair store might be able to stretch shoes that feel tight in spots. Do not wear high-heeled shoes or shoes with pointed toes. Contact a health care provider if: Your pain gets worse. Your toe becomes red or swollen. You develop an open sore on your toe. Summary Hammer toe is a condition that gradually causes your toe to become bent and stiff. Hammer toe can be treated by taping the toe into a straightened position and doing toe-stretching exercises. If these treatments do not help, surgery may be needed. To prevent this condition, wear shoes that fit properly, give your toes enough room, and do not cause pain. This information is not intended to replace advice given to you by your health care provider. Make sure you discuss any questions you have with your health care provider. Document Revised: 06/03/2019 Document Reviewed: 06/03/2019 Elsevier Patient Education  2022 [employer].

## 2021-03-29 NOTE — Progress Notes (Signed)
Subjective:   Patient ID: [name], female   DOB: 76 y.o.   MRN: [id number]   HPI 76 year old female presents the office today for concerns of painful left bunion as well as hammertoe of her second digit.  This is been ongoing for quite some time is been getting worse.  Started about 10 years ago it started hurting more last year.  She said the second toe is crossing over the big toe.  She is tried shoe modifications and continues to hurt.  She has a shooting pain into the toes.  She is tried offloading pads as well.  She is interested in surgical intervention to fix this as she gets pain on daily basis.   Review of Systems  All other systems reviewed and are negative.  Past Medical History:  Diagnosis Date   Bacterial overgrowth syndrome    Positive HBT   Carotid artery occlusion    GERD (gastroesophageal reflux disease)    Heart murmur    mild AS 03/21/20 echo   Hypercholesteremia    Hypertension     Past Surgical History:  Procedure Laterality Date   ABDOMINAL HYSTERECTOMY     BACTERIAL OVERGROWTH TEST N/A 01/07/2013   Procedure: BACTERIAL OVERGROWTH TEST;  Surgeon: Corbin Ade, MD;  Location: [facility];  Service: Endoscopy;  Laterality: N/A;  7:30   Bladder Tack     x 2   COLONOSCOPY  July 2007   Dr. Lovell Sheehan: normal. Due for screening 2017   ENDARTERECTOMY Left 03/27/2020   Procedure: LEFT CAROTID ENDARTERECTOMY;  Surgeon: Sherren Kerns, MD;  Location: [facility] OR;  Service: Vascular;  Laterality: Left;   ESOPHAGOGASTRODUODENOSCOPY N/A 10/12/2012   RMR: Small inlet patch.  Small hiatal hernia.  Status post gastric biopsy, negative H.pylori   ESOPHAGOGASTRODUODENOSCOPY (EGD) WITH ESOPHAGEAL DILATION  Dec 2011   Dr. Jena Gauss: non-critical Schatzki's ring s/p dilation with 56 F dilator, small hiatal hernia, otherwise normal   FRACTURE SURGERY     left wrist   IR ANGIO EXTERNAL CAROTID SEL EXT CAROTID BILAT MOD SED  03/02/2019   IR ANGIO INTRA EXTRACRAN SEL INTERNAL CAROTID  BILAT MOD SED  03/02/2019   IR ANGIO VERTEBRAL SEL VERTEBRAL BILAT MOD SED  03/02/2019   IR US GUIDE VASC ACCESS RIGHT  03/02/2019   KNEE ARTHROSCOPY     Bilateral (torn meniscus)   ROTATOR CUFF REPAIR     TUBAL LIGATION       Current Outpatient Medications:    amLODipine (NORVASC) 5 MG tablet, Take 1 tablet by mouth daily., Disp: , Rfl: 1   aspirin EC 81 MG tablet, Take 81 mg by mouth daily., Disp: , Rfl:    atorvastatin (LIPITOR) 20 MG tablet, Take 20 mg by mouth at bedtime., Disp: , Rfl: 1   B Complex-C (B-COMPLEX WITH VITAMIN C) tablet, Take 1 tablet by mouth every Monday, Tuesday, Wednesday, Thursday, and Friday., Disp: , Rfl:    esomeprazole (NEXIUM) 20 MG capsule, Take 20 mg by mouth daily as needed (acid reflux/indigestion.)., Disp: , Rfl:   Allergies  Allergen Reactions   Oxycodone Other (See Comments)    "makes me deathly sick"          Objective:  Physical Exam  General: AAO x3, NAD  Dermatological: Skin is warm, dry and supple bilateral. There are no open sores, no preulcerative lesions, no rash or signs of infection present.  Vascular: Dorsalis Pedis artery and Posterior Tibial artery pedal pulses are 2/4 bilateral with  immedate capillary fill time. There is no pain with calf compression, swelling, warmth, erythema.   Neruologic: Grossly intact via light touch bilateral.   Musculoskeletal: Moderate bunions present on the left foot and the second toe is overlapping the hallux.  Hammertoe contractures present.  Tenderness palpation along the bunion as well as left second digit.  No other areas of discomfort identified.  Muscular strength 5/5 in all groups tested bilateral.  Gait: Unassisted, Nonantalgic.       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  Bunions present.  Hammertoe contractures present with overlapping second digit. -We discussed both conservative as well as surgical treatment options.  Conservatively we discussed shoes and wider toe box.  Discussed offloading pads.  She is attempted shoe modification off and without significant improvement was to proceed with surgical intervention.  We discussed different treatment options surgically.  Also discussed and decided to proceed with first metatarsal ostomy with plate, screw fixation with likely Akin osteotomy, second metatarsal shortening osteotomy with hammertoe repair with K wire fixation. -The incision placement as well as the postoperative course was discussed with the patient. I discussed risks of the surgery which include, but not limited to, infection, bleeding, pain, swelling, need for further surgery, delayed or nonhealing, painful or ugly scar, numbness or sensation changes, over/under correction, recurrence, transfer lesions, further deformity, hardware failure, DVT/PE, loss of toe/foot. Patient understands these risks and wishes to proceed with surgery. The surgical consent was reviewed with the patient all 3 pages were signed. No promises or guarantees were given to the outcome of the procedure. All questions were answered to the best of my ability. Before the surgery the patient was encouraged to call the office if there is any further questions. The surgery will be performed at the [facility] on an outpatient basis. -Medial clearance requested. -Boot dispensed for postoperative use  Vivi Barrack DPM

## 2021-03-30 ENCOUNTER — Encounter: Payer: Self-pay | Admitting: Podiatry

## 2021-04-02 ENCOUNTER — Ambulatory Visit: Payer: Medicare Other | Admitting: Orthopedic Surgery

## 2021-04-02 ENCOUNTER — Telehealth: Payer: Self-pay | Admitting: Urology

## 2021-04-02 NOTE — Telephone Encounter (Signed)
DOS - 04/18/21  DOUBLE OSTEOTOMY LEFT --- [id number] TARSAL EXOSTECTOMY LEFT --- [id number] HAMMERTOE REPAIR 2ND LEFT --- [id number]   [employer] EFFECTIVE DATE - 05/02/15   NO PRIOR AUTH REQUIRED

## 2021-04-04 DIAGNOSIS — K760 Fatty (change of) liver, not elsewhere classified: Secondary | ICD-10-CM | POA: Diagnosis not present

## 2021-04-04 DIAGNOSIS — E785 Hyperlipidemia, unspecified: Secondary | ICD-10-CM | POA: Diagnosis not present

## 2021-04-04 DIAGNOSIS — I2510 Atherosclerotic heart disease of native coronary artery without angina pectoris: Secondary | ICD-10-CM | POA: Diagnosis not present

## 2021-04-04 DIAGNOSIS — K219 Gastro-esophageal reflux disease without esophagitis: Secondary | ICD-10-CM | POA: Diagnosis not present

## 2021-04-10 ENCOUNTER — Other Ambulatory Visit: Payer: Self-pay | Admitting: Podiatry

## 2021-04-10 DIAGNOSIS — M79672 Pain in left foot: Secondary | ICD-10-CM

## 2021-04-11 DIAGNOSIS — E785 Hyperlipidemia, unspecified: Secondary | ICD-10-CM | POA: Diagnosis not present

## 2021-04-11 DIAGNOSIS — I350 Nonrheumatic aortic (valve) stenosis: Secondary | ICD-10-CM | POA: Diagnosis not present

## 2021-04-11 DIAGNOSIS — I10 Essential (primary) hypertension: Secondary | ICD-10-CM | POA: Diagnosis not present

## 2021-04-16 ENCOUNTER — Other Ambulatory Visit: Payer: Self-pay

## 2021-04-16 ENCOUNTER — Ambulatory Visit: Payer: Medicare Other | Admitting: Podiatry

## 2021-04-16 DIAGNOSIS — M778 Other enthesopathies, not elsewhere classified: Secondary | ICD-10-CM

## 2021-04-16 MED ORDER — TRIAMCINOLONE ACETONIDE 10 MG/ML IJ SUSP
10.0000 mg | Freq: Once | INTRAMUSCULAR | Status: AC
  Administered 2021-04-16: 10 mg

## 2021-04-16 NOTE — Patient Instructions (Signed)

## 2021-04-20 NOTE — Progress Notes (Signed)
Subjective: 76 year old female presents the office today wanting to change what she wants to do for the surgery.  She is not able to take much time off of work as she was thinking about only having the top of the foot bone spur shaved down.  Otherwise her symptoms are unchanged.  No new concerns otherwise.  Objective: AAO x3, NAD DP/PT pulses palpable bilaterally, CRT less than 3 seconds Overall exam is unchanged.  Moderate bunion is present in the second toe is overlapping the hallux.  Hammertoes are present.  Dorsal spurring present on the midfoot.  There is a small mount of fluid consistent with small ganglion cyst in the dorsal aspect of midfoot.  Flexor, extensor tendons clinically appear to be intact. No pain with calf compression, swelling, warmth, erythema  Assessment: Bunion, hammertoe, dorsal spur with soft tissue mass likely ganglion cyst  Plan: -All treatment options discussed with the patient including all alternatives, risks, complications.  -Again discussed treatment options both conservative as well as surgical.  She does not know the bunion of the toe states this time.  After discussion with him hold off on surgery at all and start a steroid injection at the top of the foot on the area of bone spur, soft tissue mass is significantly helpful.  Discussed risks of injection and verbal consent obtained.  Skin was prepped with alcohol and mixture of 1 cc Kenalog 10, 0.5 cc of Marcaine plain, 0.5 cc of lidocaine was infiltrated into the area of tenderness on the dorsal midfoot without complications.  Postinjection care discussed. -Patient encouraged to call the office with any questions, concerns, change in symptoms.   Vivi Barrack DPM

## 2021-04-23 ENCOUNTER — Encounter: Payer: Medicare Other | Admitting: Podiatry

## 2021-05-03 ENCOUNTER — Encounter: Payer: Medicare Other | Admitting: Podiatry

## 2021-05-17 ENCOUNTER — Encounter: Payer: Medicare Other | Admitting: Podiatry

## 2021-06-11 ENCOUNTER — Ambulatory Visit (HOSPITAL_COMMUNITY): Payer: Medicare Other | Attending: Cardiology

## 2021-06-11 DIAGNOSIS — I350 Nonrheumatic aortic (valve) stenosis: Secondary | ICD-10-CM | POA: Diagnosis not present

## 2021-06-11 LAB — ECHOCARDIOGRAM COMPLETE
AR max vel: 2.08 cm2
AV Area VTI: 2.14 cm2
AV Area mean vel: 2.04 cm2
AV Mean grad: 14 mmHg
AV Peak grad: 24.6 mmHg
Ao pk vel: 2.48 m/s
Area-P 1/2: 3.77 cm2
S' Lateral: 2.2 cm

## 2021-06-13 ENCOUNTER — Encounter (HOSPITAL_COMMUNITY): Payer: Self-pay | Admitting: Emergency Medicine

## 2021-06-13 ENCOUNTER — Other Ambulatory Visit: Payer: Self-pay

## 2021-06-13 ENCOUNTER — Emergency Department (HOSPITAL_COMMUNITY): Payer: Medicare Other

## 2021-06-13 ENCOUNTER — Observation Stay (HOSPITAL_COMMUNITY)
Admission: EM | Admit: 2021-06-13 | Discharge: 2021-06-14 | Disposition: A | Discharge status: Home / Self Care | Payer: Medicare Other | Attending: Internal Medicine | Admitting: Internal Medicine

## 2021-06-13 DIAGNOSIS — I10 Essential (primary) hypertension: Secondary | ICD-10-CM

## 2021-06-13 DIAGNOSIS — I6522 Occlusion and stenosis of left carotid artery: Secondary | ICD-10-CM | POA: Diagnosis present

## 2021-06-13 DIAGNOSIS — Z79899 Other long term (current) drug therapy: Secondary | ICD-10-CM | POA: Insufficient documentation

## 2021-06-13 DIAGNOSIS — I639 Cerebral infarction, unspecified: Secondary | ICD-10-CM | POA: Diagnosis not present

## 2021-06-13 DIAGNOSIS — R739 Hyperglycemia, unspecified: Secondary | ICD-10-CM

## 2021-06-13 DIAGNOSIS — R2689 Other abnormalities of gait and mobility: Secondary | ICD-10-CM | POA: Diagnosis not present

## 2021-06-13 DIAGNOSIS — H538 Other visual disturbances: Secondary | ICD-10-CM | POA: Diagnosis not present

## 2021-06-13 DIAGNOSIS — K219 Gastro-esophageal reflux disease without esophagitis: Secondary | ICD-10-CM | POA: Diagnosis not present

## 2021-06-13 DIAGNOSIS — Z20822 Contact with and (suspected) exposure to covid-19: Secondary | ICD-10-CM | POA: Diagnosis not present

## 2021-06-13 DIAGNOSIS — Z7982 Long term (current) use of aspirin: Secondary | ICD-10-CM | POA: Diagnosis not present

## 2021-06-13 DIAGNOSIS — E782 Mixed hyperlipidemia: Secondary | ICD-10-CM | POA: Diagnosis present

## 2021-06-13 DIAGNOSIS — G459 Transient cerebral ischemic attack, unspecified: Principal | ICD-10-CM

## 2021-06-13 DIAGNOSIS — E669 Obesity, unspecified: Secondary | ICD-10-CM

## 2021-06-13 LAB — URINALYSIS, ROUTINE W REFLEX MICROSCOPIC
Bilirubin Urine: NEGATIVE
Glucose, UA: NEGATIVE mg/dL
Ketones, ur: NEGATIVE mg/dL
Leukocytes,Ua: NEGATIVE
Nitrite: NEGATIVE
Protein, ur: NEGATIVE mg/dL
Specific Gravity, Urine: 1.006 (ref 1.005–1.030)
pH: 5 (ref 5.0–8.0)

## 2021-06-13 LAB — RAPID URINE DRUG SCREEN, HOSP PERFORMED
Amphetamines: NOT DETECTED
Barbiturates: NOT DETECTED
Benzodiazepines: NOT DETECTED
Cocaine: NOT DETECTED
Opiates: NOT DETECTED
Tetrahydrocannabinol: NOT DETECTED

## 2021-06-13 LAB — PROTIME-INR
INR: 0.9 (ref 0.8–1.2)
Prothrombin Time: 12.1 seconds (ref 11.4–15.2)

## 2021-06-13 LAB — COMPREHENSIVE METABOLIC PANEL
ALT: 21 U/L (ref 0–44)
AST: 16 U/L (ref 15–41)
Albumin: 4 g/dL (ref 3.5–5.0)
Alkaline Phosphatase: 94 U/L (ref 38–126)
Anion gap: 8 (ref 5–15)
BUN: 20 mg/dL (ref 8–23)
CO2: 25 mmol/L (ref 22–32)
Calcium: 9 mg/dL (ref 8.9–10.3)
Chloride: 105 mmol/L (ref 98–111)
Creatinine, Ser: 0.81 mg/dL (ref 0.44–1.00)
GFR, Estimated: >60 mL/min (ref >60)
Glucose, Bld: 144 mg/dL — ABNORMAL HIGH (ref 70–99)
Potassium: 3.8 mmol/L (ref 3.5–5.1)
Sodium: 138 mmol/L (ref 135–145)
Total Bilirubin: 0.5 mg/dL (ref 0.3–1.2)
Total Protein: 7.1 g/dL (ref 6.5–8.1)

## 2021-06-13 LAB — RESP PANEL BY RT-PCR (FLU A&B, COVID) ARPGX2
Influenza A by PCR: NEGATIVE
Influenza B by PCR: NEGATIVE
SARS Coronavirus 2 by RT PCR: NEGATIVE

## 2021-06-13 LAB — DIFFERENTIAL
Abs Immature Granulocytes: 0.03 10*3/uL (ref 0.00–0.07)
Basophils Absolute: 0.1 10*3/uL (ref 0.0–0.1)
Basophils Relative: 1 %
Eosinophils Absolute: 0.2 10*3/uL (ref 0.0–0.5)
Eosinophils Relative: 2 %
Immature Granulocytes: 0 %
Lymphocytes Relative: 40 %
Lymphs Abs: 3.9 10*3/uL (ref 0.7–4.0)
Monocytes Absolute: 0.7 10*3/uL (ref 0.1–1.0)
Monocytes Relative: 8 %
Neutro Abs: 4.7 10*3/uL (ref 1.7–7.7)
Neutrophils Relative %: 49 %

## 2021-06-13 LAB — CBC
HCT: 39.8 % (ref 36.0–46.0)
Hemoglobin: 13.4 g/dL (ref 12.0–15.0)
MCH: 30.4 pg (ref 26.0–34.0)
MCHC: 33.7 g/dL (ref 30.0–36.0)
MCV: 90.2 fL (ref 80.0–100.0)
Platelets: 253 10*3/uL (ref 150–400)
RBC: 4.41 MIL/uL (ref 3.87–5.11)
RDW: 12.3 % (ref 11.5–15.5)
WBC: 9.6 10*3/uL (ref 4.0–10.5)
nRBC: 0 % (ref 0.0–0.2)

## 2021-06-13 LAB — ETHANOL: Alcohol, Ethyl (B): <10 mg/dL (ref <10)

## 2021-06-13 LAB — I-STAT CHEM 8, ED
BUN: 18 mg/dL (ref 8–23)
Calcium, Ion: 1.15 mmol/L (ref 1.15–1.40)
Chloride: 103 mmol/L (ref 98–111)
Creatinine, Ser: 0.8 mg/dL (ref 0.44–1.00)
Glucose, Bld: 136 mg/dL — ABNORMAL HIGH (ref 70–99)
HCT: 43 % (ref 36.0–46.0)
Hemoglobin: 14.6 g/dL (ref 12.0–15.0)
Potassium: 3.9 mmol/L (ref 3.5–5.1)
Sodium: 139 mmol/L (ref 135–145)
TCO2: 25 mmol/L (ref 22–32)

## 2021-06-13 LAB — APTT: aPTT: 22 seconds — ABNORMAL LOW (ref 24–36)

## 2021-06-13 MED ORDER — PANTOPRAZOLE SODIUM 40 MG PO TBEC
40.0000 mg | DELAYED_RELEASE_TABLET | Freq: Every day | ORAL | Status: DC
  Administered 2021-06-14: 40 mg via ORAL
  Filled 2021-06-13: qty 1

## 2021-06-13 MED ORDER — ATORVASTATIN CALCIUM 10 MG PO TABS
20.0000 mg | ORAL_TABLET | Freq: Every day | ORAL | Status: DC
  Administered 2021-06-13: 20 mg via ORAL
  Filled 2021-06-13: qty 2

## 2021-06-13 MED ORDER — ACETAMINOPHEN 650 MG RE SUPP: 650.0000 mg | RECTAL | Status: DC | PRN

## 2021-06-13 MED ORDER — SODIUM CHLORIDE 0.9 % IV BOLUS
500.0000 mL | Freq: Once | INTRAVENOUS | Status: AC
  Administered 2021-06-13: 500 mL via INTRAVENOUS

## 2021-06-13 MED ORDER — SENNOSIDES-DOCUSATE SODIUM 8.6-50 MG PO TABS: 1.0000 | ORAL_TABLET | Freq: Every evening | ORAL | Status: DC | PRN

## 2021-06-13 MED ORDER — SODIUM CHLORIDE 0.9 % IV SOLN
100.0000 mL/h | INTRAVENOUS | Status: DC
  Administered 2021-06-13: 100 mL/h via INTRAVENOUS

## 2021-06-13 MED ORDER — B COMPLEX-C PO TABS
1.0000 | ORAL_TABLET | ORAL | Status: DC
  Administered 2021-06-14: 1 via ORAL
  Filled 2021-06-13: qty 1

## 2021-06-13 MED ORDER — ACETAMINOPHEN 160 MG/5ML PO SOLN: 650.0000 mg | ORAL | Status: DC | PRN

## 2021-06-13 MED ORDER — ASPIRIN 81 MG PO CHEW
324.0000 mg | CHEWABLE_TABLET | Freq: Once | ORAL | Status: AC
  Administered 2021-06-13: 324 mg via ORAL
  Filled 2021-06-13: qty 4

## 2021-06-13 MED ORDER — ACETAMINOPHEN 325 MG PO TABS: 650.0000 mg | ORAL_TABLET | ORAL | Status: DC | PRN

## 2021-06-13 MED ORDER — B COMPLEX-C PO TABS
1.0000 | ORAL_TABLET | ORAL | Status: DC
  Administered 2021-06-13: 1 via ORAL
  Filled 2021-06-13: qty 1

## 2021-06-13 MED ORDER — ASPIRIN EC 81 MG PO TBEC
81.0000 mg | DELAYED_RELEASE_TABLET | Freq: Every day | ORAL | Status: DC
  Administered 2021-06-14: 81 mg via ORAL
  Filled 2021-06-13: qty 1

## 2021-06-13 MED ORDER — STROKE: EARLY STAGES OF RECOVERY BOOK
Freq: Once | Status: DC
  Filled 2021-06-13: qty 1

## 2021-06-13 NOTE — ED Notes (Signed)
Provider at bedside

## 2021-06-13 NOTE — ED Notes (Signed)
Against RN advisement, pt requested to walk to the bathroom with minimal assist. Purewick was offered or bedpan but pt refused.  ?

## 2021-06-13 NOTE — Consult Note (Signed)
[facility] Tele[facility]urology Consult Services ? ? ?Patient Name:   [name], [name] ?Date of Birth:   1945-04-06 ?Identification Number:   MRN - [id number] ?Date of Service:   06/13/2021 21:15:13 ? ?Diagnosis: ?      H53.8 - Blurred Vision ? ?Impression: ?     woozy feeling since 1500-1600 today, which the patient has difficulty describing and is not completely resolved, followed by blurred vision at 1900 while watching tv, which is throughout her whole vision and not just in one visual field, and possible episode of double vision (however, patient says it was more blurred than double, unknown if it was binocular or monocular) of unclear etiology. Currently her exam is normal with intact visual fields throughout. The description of her symptoms sound atypical for stroke however, cannot rule out posterior circulationstroke given the possible episode of diplopia at one point (which has now resolved). Also on the differential is metabolic/toxic/infectious etiology. NIHSS is 0, symptoms are improving, and LKW is not completely clear since it is not clear whether this "woozy feeling" is related to the vision issues. Therefore, the patient is not a thrombolytic candidate. ? ?Our recommendations are outlined below. ? ?Recommendations: ? ?      Stroke/Telemetry Floor ?      Neuro Checks ?      Bedside Swallow Eval ?      DVT Prophylaxis ?      IV Fluids, Normal Saline ?      Head of Bed 30 Degrees ?      Euglycemia and Avoid Hyperthermia (PRN Acetaminophen) ?      Initiate or continue Aspirin 325 MG daily ?      Antihypertensives PRN if Blood pressure is greater than 220/120 or there is a concern for End organ damage/contraindications for permissive HTN. If blood pressure is greater than 220/120 give labetalol PO or IV or Vasotec IV with a goal of 15% reduction in BP during the first 24 hours. ? ?Per facility request will defer further work up, management, and referrals to inpatient service, inclusive of  inpatient neurology consult. ? ?Sign Out: ?      Discussed with Emergency Department Provider ? ? ? ?------------------------------------------------------------------------------ ? ?Advanced Imaging: ?Advanced Imaging Not Completed because: ? ?NIHSS < 6 with no cortical findings and history/exam not consistent with LVO ? ? ?Metrics: ?Last Known Well: 06/13/2021 15:00:00 ?[facility] Notification Time: 06/13/2021 21:15:13 ?Arrival Time: 06/13/2021 20:45:00 ?Stamp Time: 06/13/2021 21:15:13 ?Initial Response Time: 06/13/2021 21:17:00 ?Symptoms: blurred vision. ?NIHSS Start Assessment Time: 06/13/2021 21:50:52 ?Patient is not a candidate for Thrombolytic. ?Thrombolytic Medical Decision: 06/13/2021 21:51:37 ?Patient was not deemed candidate for Thrombolytic because of following reasons: ?Last Well Known Above 4.5 Hours. ?No disabling symptoms. ?Other Diagnosis suspected. ? ?CT head showed no acute hemorrhage or acute core infarct. ? ?ED Physician notified of diagnostic impression and management plan on 06/13/2021 22:08:14 ? ? ? ?------------------------------------------------------------------------------ ? ?History of Present Illness: ?Patient is a 76 year old Female. ? ?Patient was brought by private transportation with symptoms of blurred vision. ?76 year old woman with history of HTN and HLD who presents with vision changes and feeling "woozy." Last known well is not completely clear because she has been feeling "woozy" sometime between 1500 and 1600 today. She has a difficult time describing the symptoms and what she means by woozy. She denied nausea and dizziness but states that she just didn't feel right and "didn't feel like herself." She said she sort of felt out of it. She  wasn't sure if it was due to her allergies. She was doing work outside and came in to rest. After resting for awhile, she felt better but not completely back to normal. Then, at 1900, while she was watching tv, she developed blurred and  double vision. She states that the double vision were images on top of eachother but says it was more blurred than double vision. She did not close one eye to see if it resolved or remained. The double vision has now completely resolved. The blurred vision is everywhere throughout her vision and not just in one visual field. She states it was everywhere she was looking, on both sides. One side was not worse than the other. The blurred vision has also improved but it not completely resolved currently. She denies any headache. ?  ? ?Past Medical History: ?     Hypertension ?     Hyperlipidemia ?     There is no history of Diabetes Mellitus ?     There is no history of Atrial Fibrillation ?     There is no history of Coronary Artery Disease ?     There is no history of Stroke ? ?Medications: ? ?No Anticoagulant use  ?Antiplatelet use: Yes ASA 81 mg ?Reviewed EMR for current medications ? ?Allergies:  ?Reviewed ? ?Social History: ?Smoking: No ? ?Family History: ? ?There is no family history of premature cerebrovascular disease pertinent to this consultation ? ?ROS : ?14 Points Review of Systems was performed and was negative except mentioned in HPI. ? ?Past Surgical History: ?There Is No Surgical History Contributory To Today?s Visit ? ?  ? ?Examination: ?BP(150/75), Pulse(89), Blood Glucose(144) ?1A: Level of Consciousness - Alert; keenly responsive + 0 ?1B: Ask Month and Age - Both Questions Right + 0 ?1C: Blink Eyes & Squeeze Hands - Performs Both Tasks + 0 ?2: Test Horizontal Extraocular Movements - Normal + 0 ?3: Test Visual Fields - No Visual Loss + 0 ?4: Test Facial Palsy (Use Grimace if Obtunded) - Normal symmetry + 0 ?5A: Test Left Arm Motor Drift - No Drift for 10 Seconds + 0 ?5B: Test Right Arm Motor Drift - No Drift for 10 Seconds + 0 ?6A: Test Left Leg Motor Drift - No Drift for 5 Seconds + 0 ?6B: Test Right Leg Motor Drift - No Drift for 5 Seconds + 0 ?7: Test Limb Ataxia (FNF/Heel-Shin) - No Ataxia +  0 ?8: Test Sensation - Normal; No sensory loss + 0 ?9: Test Language/Aphasia - Normal; No aphasia + 0 ?10: Test Dysarthria - Normal + 0 ?11: Test Extinction/Inattention - No abnormality + 0 ? ?NIHSS Score: 0 ? ? ?Pre-Morbid Modified Rankin Scale: ?0 Points = No symptoms at all ? ? ?Patient/Family was informed the Neurology Consult would occur via TeleHealth consult by way of interactive audio and video telecommunications and consented to receiving care in this manner. ? ? ?Patient is being evaluated for possible acute neurologic impairment and high probability of imminent or life-threatening deterioration. I spent total of 25 minutes providing care to this patient, including time for face to face visit via telemedicine, review of medical records, imaging studies and discussion of findings with providers, the patient and/or family. ? ? ?Dr Vena Austria ? ? ?[facility] ?[phone] ? ? ?Case [id number] ?

## 2021-06-13 NOTE — ED Notes (Signed)
Failed IV attempted x1 ?

## 2021-06-13 NOTE — ED Triage Notes (Signed)
Pt c/o blurred vision for about an hour.  ?

## 2021-06-13 NOTE — Progress Notes (Signed)
Code stroke activated at 2105 ?Dr. Sherrie Sport joined teleneuro cart at 2144 ?

## 2021-06-13 NOTE — ED Provider Notes (Signed)
?[facility] EMERGENC[facility] DEPARTMENT ?Provider Note ? ? ?CSN: [id number] ?Arrival date & time: 06/13/21  2045 ? ?  ? ?History ? ?Chief Complaint  ?Patient presents with  ? Blurred Vision  ? ? ?[name] is a 76 y.o. female. ? ?HPI ? ?Patient presents with complaints of sudden onset of blurred vision about 1 hour ago.  Patient states she felt fine today when all of a sudden she was having difficulty focusing on things on TV.  Items were blurred.  Felt somewhat better when she covered one eye.  Patient is also feeling somewhat lightheaded.  She is not having trouble with her speech.  No headache.  No chest pain or shortness of breath. ? ?Home Medications ?Prior to Admission medications   ?Medication Sig Start Date End Date Taking? Authorizing Provider  ?amLODipine (NORVASC) 5 MG tablet Take 1 tablet by mouth daily. 04/03/15   [provider]  ?aspirin EC 81 MG tablet Take 81 mg by mouth daily.    [provider]  ?atorvastatin (LIPITOR) 20 MG tablet Take 20 mg by mouth at bedtime. 02/07/15   [provider]  ?B Complex-C (B-COMPLEX WITH VITAMIN C) tablet Take 1 tablet by mouth every Monday, Tuesday, Wednesday, Thursday, and Friday.    [provider]  ?esomeprazole (NEXIUM) 20 MG capsule Take 20 mg by mouth daily as needed (acid reflux/indigestion.).    [provider]  ?   ? ?Allergies    ?Oxycodone   ? ?Review of Systems   ?Review of Systems ? ?Physical Exam ?Updated Vital Signs ?BP 121/76 (BP Location: Right Arm)   Pulse 95   Temp (!) 97.2 ?F (36.2 ?C) (Oral)   Resp 20   Ht 1.524 m (5')   Wt 79.4 kg   SpO2 100%   BMI 34.19 kg/m?  ?Physical Exam ?Vitals and nursing note reviewed.  ?Constitutional:   ?   General: She is not in acute distress. ?   Appearance: She is well-developed.  ?HENT:  ?   Head: Normocephalic and atraumatic.  ?   Right Ear: External ear normal.  ?   Left Ear: External ear normal.  ?Eyes:  ?   General: No scleral icterus.    ?   Right eye: No  discharge.     ?   Left eye: No discharge.  ?   Conjunctiva/sclera: Conjunctivae normal.  ?Neck:  ?   Trachea: No tracheal deviation.  ?Cardiovascular:  ?   Rate and Rhythm: Normal rate and regular rhythm.  ?Pulmonary:  ?   Effort: Pulmonary effort is normal. No respiratory distress.  ?   Breath sounds: Normal breath sounds. No stridor. No wheezing or rales.  ?Abdominal:  ?   General: Bowel sounds are normal. There is no distension.  ?   Palpations: Abdomen is soft.  ?   Tenderness: There is no abdominal tenderness. There is no guarding or rebound.  ?Musculoskeletal:     ?   General: No tenderness.  ?   Cervical back: Neck supple.  ?Skin: ?   General: Skin is warm and dry.  ?   Findings: No rash.  ?Neurological:  ?   Mental Status: She is alert and oriented to person, place, and time.  ?   Cranial Nerves: No cranial nerve deficit.  ?   Sensory: No sensory deficit.  ?   Motor: Weakness present. No abnormal muscle tone or seizure activity.  ?   Coordination: Coordination normal.  ?  Comments: No pronator drift bilateral upper extrem, unable to hold right legs off bed for 5 seconds, sensation intact in all extremities, no visual field cuts, no left or right sided neglect, normal finger-nose exam bilaterally, no nystagmus noted ? ?No facial droop, extraocular movements intact, tongue midline ? ?Difficulty reading the sign across the room, sx persist with either eye covered  ? ? ?ED Results / Procedures / Treatments   ?Labs ?(all labs ordered are listed, but only abnormal results are displayed) ?Labs Reviewed  ?RESP PANEL BY RT-PCR (FLU A&B, COVID) ARPGX2  ?ETHANOL  ?PROTIME-INR  ?APTT  ?CBC  ?DIFFERENTIAL  ?COMPREHENSIVE METABOLIC PANEL  ?RAPID URINE DRUG SCREEN, HOSP PERFORMED  ?URINALYSIS, ROUTINE W REFLEX MICROSCOPIC  ?I-STAT CHEM 8, ED  ? ? ?EKG ?None ? ?Radiology ?No results found. ? ?Procedures ?Procedures  ? ? ?Medications Ordered in ED ?Medications  ?sodium chloride 0.9 % bolus 500 mL (has no administration in  time range)  ?  Followed by  ?0.9 %  sodium chloride infusion (has no administration in time range)  ? ? ?ED Course/ Medical Decision Making/ A&P ?Clinical Course as of 06/15/21 0725  ?Wed Jun 13, 2021  ?2200 Comprehensive metabolic panel(!) ?Normal except for hyperglycemia [JK]  ?2200 CBC ?Normal [JK]  ?2200 CT HEAD CODE STROKE WO CONTRAST ?Head CT without acute findings [JK]  ?2259 Case discussed with Dr. Thomes Dinning regarding admission [JK]  ?  ?Clinical Course User Index ?[JK] Linwood Dibbles, MD  ? ?                        ?Medical Decision Making ?Amount and/or Complexity of Data Reviewed ?Labs: ordered. Decision-making details documented in ED Course. ?Radiology: ordered. Decision-making details documented in ED Course. ? ?Risk ?OTC drugs. ?Prescription drug management. ?Decision regarding hospitalization. ? ? ?Patient presented with complaints of acute vision changes.  Was concerned about the possibility of an acute stroke.  On my exam patient did appear to have some mild weakness in her right leg and complains of difficulty reading.  Somewhat difficult to assess whether it was unilateral versus bilateral.  Code stroke was activated.  Head CT without any acute changes.  Labs without signs of significant electrolyte abnormalities.  No anemia.  Patient was evaluated by Dr. Melynda Ripple teleneurology evaluation.  Patient was able to provide a little bit more information indicating that some of her symptoms had actually been ongoing for a bit longer.  Patient's symptoms continue to improve.  Dr. Sherrie Sport did not recommend tPA or any acute intervention at this time.  Patient however will be admitted for further TIA, occult stroke work-up including MRI and carotid studies.  I will consult the medical service for admission. ? ? ? ? ? ? ? ?Final Clinical Impression(s) / ED Diagnoses ?Final diagnoses:  ?Blurred vision  ? ? ?Rx / DC Orders ?ED Discharge Orders   ? ? None  ? ?  ? ? ?  ?Linwood Dibbles, MD ?06/15/21 0725 ? ?

## 2021-06-13 NOTE — ED Notes (Signed)
Pt to CT at 2112 ?

## 2021-06-13 NOTE — ED Notes (Signed)
Code Stroke called by EDP at 2103 ?

## 2021-06-13 NOTE — ED Notes (Signed)
Pt stated "I feel like things are calming down some, I am starting to see more clearly" ?

## 2021-06-13 NOTE — H&P (Signed)
?History and Physical  ? ? ?Patient: [name] [id number] DOB: 01-Oct-1945 ?DOA: 06/13/2021 ?DOS: the patient was seen and examined on 06/14/2021 ?PCP: Asencion Noble, MD  ?Patient coming from: Home ? ?Chief Complaint:  ?Chief Complaint  ?Patient presents with  ? Blurred Vision  ? ?HPI: [name] is a 76 y.o. female with medical history significant for GERD, hypertension, hyperlipidemia, obesity, aortic valve stenosis, left carotid stenosis status post left carotid endarterectomy who presents to the emergency department due to sudden onset of blurred vision which occurred around 7 PM to 8 PM while watching TV, she states that she got up to wash her face and that the blurry vision persisted while in front of the mirror.  She complained of feeling dizzy which started around 3 to 4 PM prior to the onset of blurred vision as described above.  Patient denies chest pain, shortness of breath, nausea, vomiting, headache, fever, chills or abdominal pain. ? ?ED Course:  ?In the emergency department, she was hemodynamically, initial BP was 165/79, other vital signs were within normal range.  Work-up in the ED showed normal CBC and BMP except for hyperglycemia, urinalysis was normal, urine drug screen was negative, alcohol level was less than 10.  Influenza A, B, SARS coronavirus 2 was negative. ?CT head without contrast showed no acute intracranial abnormality ?She was treated with aspirin 324 mg x 1, teleneurologist was consulted and recommended further stroke work-up. ? ?Review of Systems: ?Review of systems as noted in the HPI. All other systems reviewed and are negative. ? ? ?Past Medical History:  ?Diagnosis Date  ? Bacterial overgrowth syndrome   ? Positive HBT  ? Carotid artery occlusion   ? GERD (gastroesophageal reflux disease)   ? Heart murmur   ? mild AS 03/21/20 echo  ? Hypercholesteremia   ? Hypertension   ? ?Past Surgical History:  ?Procedure Laterality Date  ? ABDOMINAL HYSTERECTOMY    ? BACTERIAL OVERGROWTH TEST  N/A 01/07/2013  ? Procedure: BACTERIAL OVERGROWTH TEST;  Surgeon: Daneil Dolin, MD;  Location: [facility];  Service: Endoscopy;  Laterality: N/A;  7:30  ? Bladder Tack    ? x 2  ? COLONOSCOPY  July 2007  ? Dr. Arnoldo Morale: normal. Due for screening 2017  ? ENDARTERECTOMY Left 03/27/2020  ? Procedure: LEFT CAROTID ENDARTERECTOMY;  Surgeon: Elam Dutch, MD;  Location: [facility] OR;  Service: Vascular;  Laterality: Left;  ? ESOPHAGOGASTRODUODENOSCOPY N/A 10/12/2012  ? RMR: Small inlet patch.  Small hiatal hernia.  Status post gastric biopsy, negative H.pylori  ? ESOPHAGOGASTRODUODENOSCOPY (EGD) WITH ESOPHAGEAL DILATION  Dec 2011  ? Dr. Gala Romney: non-critical Schatzki's ring s/p dilation with 83 F dilator, small hiatal hernia, otherwise normal  ? FRACTURE SURGERY    ? left wrist  ? IR ANGIO EXTERNAL CAROTID SEL EXT CAROTID BILAT MOD SED  03/02/2019  ? IR ANGIO INTRA EXTRACRAN SEL INTERNAL CAROTID BILAT MOD SED  03/02/2019  ? IR ANGIO VERTEBRAL SEL VERTEBRAL BILAT MOD SED  03/02/2019  ? IR US GUIDE VASC ACCESS RIGHT  03/02/2019  ? KNEE ARTHROSCOPY    ? Bilateral (torn meniscus)  ? ROTATOR CUFF REPAIR    ? TUBAL LIGATION    ? ? ?Social History:  reports that she has never smoked. She has never used smokeless tobacco. She reports that she does not drink alcohol and does not use drugs. ? ? ?Allergies  ?Allergen Reactions  ? Oxycodone Other (See Comments)  ?  "makes me deathly sick"  ? ? ?  Family History  ?Problem Relation Age of Onset  ? Colon cancer Neg Hx   ?  ? ?Prior to Admission medications   ?Medication Sig Start Date End Date Taking? Authorizing Provider  ?amLODipine (NORVASC) 5 MG tablet Take 1 tablet by mouth daily. 04/03/15   [provider]  ?aspirin EC 81 MG tablet Take 81 mg by mouth daily.    [provider]  ?atorvastatin (LIPITOR) 20 MG tablet Take 20 mg by mouth at bedtime. 02/07/15   [provider]  ?B Complex-C (B-COMPLEX WITH VITAMIN C) tablet Take 1 tablet by mouth every Monday,  Tuesday, Wednesday, Thursday, and Friday.    [provider]  ?esomeprazole (NEXIUM) 20 MG capsule Take 20 mg by mouth daily as needed (acid reflux/indigestion.).    [provider]  ? ? ?Physical Exam: ?BP 135/62   Pulse 80   Temp (!) 97.2 ?F (36.2 ?C) (Oral)   Resp (!) 21   Ht 5' (1.524 m)   Wt 79.4 kg   SpO2 94%   BMI 34.19 kg/m?  ? ?General: 76 y.o. year-old female well developed well nourished in no acute distress.  Alert and oriented x3. ?HEENT: NCAT, EOMI ?Neck: Supple, trachea medial ?Cardiovascular: Regular rate and rhythm with no rubs or gallops.  No thyromegaly or JVD noted.  No lower extremity edema. 2/4 pulses in all 4 extremities. ?Respiratory: Clear to auscultation with no wheezes or rales. Good inspiratory effort. ?Abdomen: Soft, nontender nondistended with normal bowel sounds x4 quadrants. ?Muskuloskeletal: No cyanosis, clubbing or edema noted bilaterally ?Neuro: CN II-XII intact, strength 5/5 x 4, sensation, reflexes intact, NIHSS 0 ?Skin: No ulcerative lesions noted or rashes ?Psychiatry: Judgement and insight appear normal. Mood is appropriate for condition and setting ?   ?   ?   ?Labs on Admission:  ?Basic Metabolic Panel: ?Recent Labs  ?Lab 06/13/21 ?2110 06/13/21 ?2115  ?NA 138 139  ?K 3.8 3.9  ?CL 105 103  ?CO2 25  --   ?GLUCOSE 144* 136*  ?BUN 20 18  ?CREATININE 0.81 0.80  ?CALCIUM 9.0  --   ? ?Liver Function Tests: ?Recent Labs  ?Lab 06/13/21 ?2110  ?AST 16  ?ALT 21  ?ALKPHOS 94  ?BILITOT 0.5  ?PROT 7.1  ?ALBUMIN 4.0  ? ?No results for input(s): LIPASE, AMYLASE in the last 168 hours. ?No results for input(s): AMMONIA in the last 168 hours. ?CBC: ?Recent Labs  ?Lab 06/13/21 ?2110 06/13/21 ?2115  ?WBC 9.6  --   ?NEUTROABS 4.7  --   ?HGB 13.4 14.6  ?HCT 39.8 43.0  ?MCV 90.2  --   ?PLT 253  --   ? ?Cardiac Enzymes: ?No results for input(s): CKTOTAL, CKMB, CKMBINDEX, TROPONINI in the last 168 hours. ? ?BNP (last 3 results) ?No results for input(s): BNP in the last 8760  hours. ? ?ProBNP (last 3 results) ?No results for input(s): PROBNP in the last 8760 hours. ? ?CBG: ?No results for input(s): GLUCAP in the last 168 hours. ? ?Radiological Exams on Admission: ?CT HEAD CODE STROKE WO CONTRAST ? ?Result Date: 06/13/2021 ?CLINICAL DATA:  Code stroke.  Blurry vision EXAM: CT HEAD WITHOUT CONTRAST TECHNIQUE: Contiguous axial images were obtained from the base of the skull through the vertex without intravenous contrast. RADIATION DOSE REDUCTION: This exam was performed according to the departmental dose-optimization program which includes automated exposure control, adjustment of the mA and/or kV according to patient size and/or use of iterative reconstruction technique. COMPARISON:  None. FINDINGS: Brain: There is  no mass, hemorrhage or extra-axial collection. The size and configuration of the ventricles and extra-axial CSF spaces are normal. The brain parenchyma is normal, without evidence of acute or chronic infarction. Vascular: No abnormal hyperdensity of the major intracranial arteries or dural venous sinuses. No intracranial atherosclerosis. Skull: The visualized skull base, calvarium and extracranial soft tissues are normal. Sinuses/Orbits: No fluid levels or advanced mucosal thickening of the visualized paranasal sinuses. No mastoid or middle ear effusion. The orbits are normal. ASPECTS [facility] Stroke Program Early CT Score) - Ganglionic level infarction (caudate, lentiform nuclei, internal capsule, insula, M1-M3 cortex): 7 - Supraganglionic infarction (M4-M6 cortex): 3 Total score (0-10 with 10 being normal): 10 IMPRESSION: 1. No acute intracranial abnormality. 2. ASPECTS is 10. These results were called by telephone at the time of interpretation on 06/13/2021 at 9:23 pm to provider [facility] , who verbally acknowledged these results. Electronically Signed   By: Ulyses Jarred M.D.   On: 06/13/2021 21:29   ? ?EKG: I independently viewed the EKG done and my findings are as followed:  Normal sinus rhythm at a rate of 99 bpm ? ?Assessment/Plan ?Present on Admission: ? Blurred vision ? Left carotid artery stenosis ? Mixed hyperlipidemia ? GERD (gastroesophageal reflux disease) ? ?Principal Probl

## 2021-06-14 ENCOUNTER — Observation Stay (HOSPITAL_COMMUNITY): Payer: Medicare Other

## 2021-06-14 ENCOUNTER — Telehealth: Payer: Self-pay | Admitting: *Deleted

## 2021-06-14 DIAGNOSIS — H538 Other visual disturbances: Secondary | ICD-10-CM | POA: Diagnosis not present

## 2021-06-14 DIAGNOSIS — I6522 Occlusion and stenosis of left carotid artery: Secondary | ICD-10-CM | POA: Diagnosis not present

## 2021-06-14 DIAGNOSIS — G459 Transient cerebral ischemic attack, unspecified: Secondary | ICD-10-CM | POA: Diagnosis not present

## 2021-06-14 DIAGNOSIS — M47812 Spondylosis without myelopathy or radiculopathy, cervical region: Secondary | ICD-10-CM | POA: Diagnosis not present

## 2021-06-14 DIAGNOSIS — Z8673 Personal history of transient ischemic attack (TIA), and cerebral infarction without residual deficits: Secondary | ICD-10-CM | POA: Diagnosis not present

## 2021-06-14 DIAGNOSIS — E669 Obesity, unspecified: Secondary | ICD-10-CM

## 2021-06-14 DIAGNOSIS — R29818 Other symptoms and signs involving the nervous system: Secondary | ICD-10-CM | POA: Diagnosis not present

## 2021-06-14 DIAGNOSIS — R739 Hyperglycemia, unspecified: Secondary | ICD-10-CM

## 2021-06-14 DIAGNOSIS — I10 Essential (primary) hypertension: Secondary | ICD-10-CM | POA: Diagnosis not present

## 2021-06-14 DIAGNOSIS — I6523 Occlusion and stenosis of bilateral carotid arteries: Secondary | ICD-10-CM | POA: Diagnosis not present

## 2021-06-14 DIAGNOSIS — E782 Mixed hyperlipidemia: Secondary | ICD-10-CM | POA: Diagnosis not present

## 2021-06-14 LAB — HEMOGLOBIN A1C
Hgb A1c MFr Bld: 5.4 % (ref 4.8–5.6)
Mean Plasma Glucose: 108.28 mg/dL

## 2021-06-14 LAB — LIPID PANEL
Cholesterol: 175 mg/dL (ref 0–200)
HDL: 55 mg/dL (ref >40)
LDL Cholesterol: 96 mg/dL (ref 0–99)
Total CHOL/HDL Ratio: 3.2 RATIO
Triglycerides: 119 mg/dL (ref <150)
VLDL: 24 mg/dL (ref 0–40)

## 2021-06-14 MED ORDER — CLOPIDOGREL BISULFATE 75 MG PO TABS
75.0000 mg | ORAL_TABLET | Freq: Every day | ORAL | 0 refills | Status: DC
Start: 2021-06-15 — End: 2021-10-22

## 2021-06-14 MED ORDER — CLOPIDOGREL BISULFATE 75 MG PO TABS
75.0000 mg | ORAL_TABLET | Freq: Every day | ORAL | Status: DC
Start: 2021-06-14 — End: 2021-06-14
  Administered 2021-06-14: 75 mg via ORAL
  Filled 2021-06-14: qty 1

## 2021-06-14 MED ORDER — ATORVASTATIN CALCIUM 40 MG PO TABS
40.0000 mg | ORAL_TABLET | Freq: Every day | ORAL | 1 refills | Status: AC
Start: 2021-06-14 — End: ?

## 2021-06-14 MED ORDER — ATORVASTATIN CALCIUM 40 MG PO TABS: 40.0000 mg | ORAL_TABLET | Freq: Every day | ORAL | Status: DC

## 2021-06-14 MED ORDER — IOHEXOL 350 MG/ML SOLN
75.0000 mL | Freq: Once | INTRAVENOUS | Status: AC | PRN
Start: 2021-06-14 — End: 2021-06-14
  Administered 2021-06-14: 75 mL via INTRAVENOUS

## 2021-06-14 NOTE — Evaluation (Signed)
Physical Therapy Evaluation ?Patient Details ?Name: [name] ?MRN: [id number] ?DOB: [birth year] ?Today's Date: 06/14/2021 ? ?History of Present Illness ? [name] is a 76 y.o. female with medical history significant for GERD, hypertension, hyperlipidemia, obesity, aortic valve stenosis, left carotid stenosis status post left carotid endarterectomy who presents to the emergency department due to sudden onset of blurred vision which occurred around 7 PM to 8 PM while watching TV, she states that she got up to wash her face and that the blurry vision persisted while in front of the mirror.  She complained of feeling dizzy which started around 3 to 4 PM prior to the onset of blurred vision as described above.  Patient denies chest pain, shortness of breath, nausea, vomiting, headache, fever, chills or abdominal pain. (per MD note) ?  ?Clinical Impression ? Patient functioning near baseline for functional mobility and gait demonstrating good return for ambulating in room and hallways with limited bilateral knee extension due chronic/baseline knee pain without loss of balance.  Plan:  Patient discharged from physical therapy to care of nursing for ambulation daily as tolerated for length of stay.  ?   ?   ? ?Recommendations for follow up therapy are one component of a multi-disciplinary discharge planning process, led by the attending physician.  Recommendations may be updated based on patient status, additional functional criteria and insurance authorization. ? ?Follow Up Recommendations No PT follow up ? ?  ?Assistance Recommended at Discharge PRN  ?Patient can return home with the following ? Other (comment) (patient at baseline) ? ?  ?Equipment Recommendations None recommended by PT  ?Recommendations for Other Services ?    ?  ?Functional Status Assessment Patient has not had a recent decline in their functional status  ? ?  ?Precautions / Restrictions Precautions ?Precautions: None ?Restrictions ?Weight Bearing  Restrictions: No  ? ?  ? ?Mobility ? Bed Mobility ?Overal bed mobility: Independent ?  ?  ?  ?  ?  ?  ?  ?  ? ?Transfers ?Overall transfer level: Independent ?  ?  ?  ?  ?  ?  ?  ?  ?  ?  ? ?Ambulation/Gait ?Ambulation/Gait assistance: Modified independent (Device/Increase time) ?Gait Distance (Feet): 100 Feet ?Assistive device: None ?Gait Pattern/deviations: Decreased step length - right, Decreased step length - left, Decreased dorsiflexion - right, Decreased dorsiflexion - left ?Gait velocity: decreased ?  ?  ?General Gait Details: slightly labored cadence with decreased bilateral knee extension due to chronic bilateral knee pain, no loss of balance ? ?Stairs ?  ?  ?  ?  ?  ? ?Wheelchair Mobility ?  ? ?Modified Rankin (Stroke Patients Only) ?  ? ?  ? ?Balance Overall balance assessment: Mild deficits observed, not formally tested ?  ?  ?  ?  ?  ?  ?  ?  ?  ?  ?  ?  ?  ?  ?  ?  ?  ?  ?   ? ? ? ?Pertinent Vitals/Pain Pain Assessment ?Faces Pain Scale: Hurts a little bit ?Pain Location: bilateral knee pain when walking ?Pain Descriptors / Indicators:  (chronic)  ? ? ?Home Living Family/patient expects to be discharged to:: Private residence ?Living Arrangements: Alone ?  ?Type of Home: House ?Home Access: Level entry ?  ?  ?  ?Home Layout: One level ?Home Equipment: None ?   ?  ?Prior Function Prior Level of Function : Independent/Modified Independent ?  ?  ?  ?  ?  ?  ?  ?  ?  ? ? ?  Hand Dominance  ? Dominant Hand: Right ? ?  ?Extremity/Trunk Assessment  ? Upper Extremity Assessment ?Upper Extremity Assessment: Overall WFL for tasks assessed ?  ? ?Lower Extremity Assessment ?Lower Extremity Assessment: Defer to PT evaluation ?  ? ?Cervical / Trunk Assessment ?Cervical / Trunk Assessment: Normal  ?Communication  ? Communication: No difficulties  ?Cognition Arousal/Alertness: Awake/alert ?Behavior During Therapy: [facility] for tasks assessed/performed ?Overall Cognitive Status: Within Functional Limits for tasks assessed ?   ?  ?  ?  ?  ?  ?  ?  ?  ?  ?  ?  ?  ?  ?  ?  ?  ?  ?  ? ?  ?General Comments   ? ?  ?Exercises    ? ?Assessment/Plan  ?  ?PT Assessment Patient does not need any further PT services  ?PT Problem List   ? ?   ?  ?PT Treatment Interventions     ? ?PT Goals (Current goals can be found in the Care Plan section)  ?Acute Rehab PT Goals ?Patient Stated Goal: return home ?PT Goal Formulation: With patient ?Time For Goal Achievement: 06/14/21 ?Potential to Achieve Goals: Good ? ?  ?Frequency   ?  ? ? ?Co-evaluation PT/OT/SLP Co-Evaluation/Treatment: Yes ?Reason for Co-Treatment: To address functional/ADL transfers ?PT goals addressed during session: Mobility/safety with mobility;Balance ?OT goals addressed during session: ADL's and self-care ?  ? ? ?  ?AM-PAC PT "6 Clicks" Mobility  ?Outcome Measure Help needed turning from your back to your side while in a flat bed without using bedrails?: None ?Help needed moving from lying on your back to sitting on the side of a flat bed without using bedrails?: None ?Help needed moving to and from a bed to a chair (including a wheelchair)?: None ?Help needed standing up from a chair using your arms (e.g., wheelchair or bedside chair)?: None ?Help needed to walk in hospital room?: None ?Help needed climbing 3-5 steps with a railing? : None ?6 Click Score: 24 ? ?  ?End of Session   ?Activity Tolerance: Patient tolerated treatment well ?Patient left: in bed;with call bell/phone within reach ?Nurse Communication: Mobility status ?PT Visit Diagnosis: Unsteadiness on feet (R26.81);Other abnormalities of gait and mobility (R26.89);Muscle weakness (generalized) (M62.81) ?  ? ?Time: [id number] ?PT Time Calculation (min) (ACUTE ONLY): 12 min ? ? ?Charges:   PT Evaluation ?$PT Eval Low Complexity: 1 Low ?PT Treatments ?$Therapeutic Activity: 8-22 mins ?  ?   ? ? ?11:29 AM, 06/14/21 ?Lonell Grandchild, MPT ?Physical Therapist with [place] ?[facility] ?[phone] office ?[phone] mobile  phone ? ? ?

## 2021-06-14 NOTE — ED Notes (Signed)
Pt ambulated to the bathroom unassisted.  

## 2021-06-14 NOTE — ED Notes (Signed)
Patient transported to MRI 

## 2021-06-14 NOTE — Progress Notes (Signed)
CODE STROKE ? ?CALL TIME  2104 ?BEEPER  2106 ?START   2112 ?END SOC  2115 ?Epic   2116 ?RAD CALLED  2116 ?

## 2021-06-14 NOTE — Assessment & Plan Note (Signed)
Continue Statin ?LDL96 ?Increase lipitor to 40 mg ?

## 2021-06-14 NOTE — Assessment & Plan Note (Addendum)
Concerned about stroke/TIA--Appreciate Neurology Consult ?-PT/OT evaluation ?-Speech therapy eval ?-CT brain--neg ?-MRI brain--neg for acute infarct ?-Carotid Duplex--no hemodynamically significant stenosis ?-CTA H&N--No large vessel occlusion, hemodynamically significant stenosis, or ?evidence of dissection. Interval left carotid endarterectomy ?-06/11/21 Echo--EF 60 to 65%, no WMA, trivial MR, mild AS. ?-LDL--96 ?-HbA1C--5.4 ?-Antiplatelet--ASA 81 mg & plavix 75 mg x 21 days, then ASA 81 daily thereafter ?

## 2021-06-14 NOTE — Discharge Summary (Addendum)
?Physician Discharge Summary ?  ?Patient: [name] MRN: [id number] DOB: 11/26/1945  ?Admit date:     06/13/2021  ?Discharge date: 06/14/21  ?Discharge Physician: Onalee HuaDavid Jerrold Haskell  ? ?PCP: Carylon PerchesFagan, Roy, MD  ? ?Recommendations at discharge:  ? Please follow up with primary care provider within 1-2 weeks ? Please repeat BMP and CBC in one week ? Please follow up on/with results of 30 day event monitor ? ? ? ?Hospital Course: ?76 year old female with a history of stroke, hypertension, hyperlipidemia, GERD, carotid stenosis status post left carotid endarterectomy (03/27/20) presenting with blurry vision that began around 8 PM on 06/13/2021 while she was watching television.  She subsequently went to the bathroom and noted that she had trouble seeing herself in the mirror secondary to the blurry vision.  She denied any focal extremity weakness, dysesthesias, dysarthria, or syncope.  However, she did describe some dizziness around 3 to 4 PM on 06/13/2021.  She denied any fevers, chills, headache, neck pain, chest pain, shortness breath, nausea, vomiting, diarrhea, abdominal pain, dysuria, hematuria.  There is been no new changes in her medications.  She is compliant with her aspirin and Lipitor.  She states that by the time she arrived to the emergency department her had begun to improve. ?In the ED, the patient was afebrile and hemodynamically stable with oxygen saturation 99% on room air.  Code stroke was activated.  CT of the brain was unremarkable.  The patient was seen by tele-neurology.  WBC 9.6, hemoglobin 13.4, platelets 253,000.  Sodium 130, potassium 3.8, bicarbonate 25, serum creatinine 0.81.  UA was negative for pyuria.  CT of the brain was negative for acute findings. ? ?Assessment and Plan: ?* TIA (transient ischemic attack) ?-Appreciate Neurology Consult ?-PT/OT evaluation--no follow up needed ?-CT brain--neg ?-MRI brain--neg for acute infarct ?-Carotid Duplex--no hemodynamically significant stenosis ?-CTA H&N--No large  vessel occlusion, hemodynamically significant stenosis, or ?evidence of dissection. Interval left carotid endarterectomy ?-06/11/21 Echo--EF 60 to 65%, no WMA, trivial MR, mild AS. ?-LDL--96 ?-HbA1C--5.4 ?-Antiplatelet--ASA 81 mg & plavix 75 mg x 21 days, then ASA 81 daily thereafter ?Cardiology was contacted to help arrange 30 day monitor after d/c ? ?Left carotid artery stenosis ?S/p Left CEA 03/27/2020 (Dr. Darrick PennaFields) ?US carotid on 02/16/21 showed bilateral 1-39% stenosis ?Continue ASA ?CTA H&N--neg for stenosis or dissection ? ?Mixed hyperlipidemia ?Continue Statin ?LDL96 ?Increase lipitor to 40 mg ? ?Hyperglycemia ?Follow up A1C--5.4 ? ?Obesity (BMI 30-39.9) ?BMI 34.19 ?Lifestyle modification ? ?Essential hypertension ?Allow for permissive HTN during hospitalization ?Hydralazine prn SBP>220 ?Resume home meds after dc ? ?GERD (gastroesophageal reflux disease) ?Continue PPI ? ? ? ? ? ?Consultants: neurology ?Procedures performed: none  ?Disposition: Home ?Diet recommendation:  ?Cardiac diet ?DISCHARGE MEDICATION: ?Allergies as of 06/14/2021   ? ?   Reactions  ? Oxycodone Other (See Comments)  ? "makes me deathly sick"  ? ?  ? ?  ?Medication List  ?  ? ?TAKE these medications   ? ?amLODipine 5 MG tablet ?Commonly known as: NORVASC ?Take 1 tablet by mouth daily. ?  ?aspirin EC 81 MG tablet ?Take 81 mg by mouth daily. ?  ?atorvastatin 40 MG tablet ?Commonly known as: LIPITOR ?Take 1 tablet (40 mg total) by mouth at bedtime. ?What changed:  ?medication strength ?how much to take ?  ?B-complex with vitamin C tablet ?Take 1 tablet by mouth every Monday, Tuesday, Wednesday, Thursday, and Friday. ?  ?clopidogrel 75 MG tablet ?Commonly known as: PLAVIX ?Take 1 tablet (75 mg total) by  mouth daily. ?Start taking on: June 15, 2021 ?  ?esomeprazole 20 MG capsule ?Commonly known as: NEXIUM ?Take 20 mg by mouth daily as needed (acid reflux/indigestion.). ?  ?Osteo Bi-Flex One Per Day Tabs ?Take 1 tablet by mouth daily as needed (joint  pain). ?  ? ?  ? ? ?Discharge Exam: ?Filed Weights  ? 06/13/21 2047  ?Weight: 79.4 kg  ? ?HEENT:  [place]/AT, No thrush, no icterus ?CV:  RRR, no rub, no S3, no S4 ?Lung:  CTA, no wheeze, no rhonchi ?Abd:  soft/+BS, NT ?Ext:  No edema, no lymphangitis, no synovitis, no rash ?Neuro:  CN II-XII intact, strength 4/5 in RUE, RLE, strength 4/5 LUE, LLE; sensation intact bilateral; no dysmetria; babinski equivocal ? ? ?Condition at discharge: stable ? ?The results of significant diagnostics from this hospitalization (including imaging, microbiology, ancillary and laboratory) are listed below for reference.  ? ?Imaging Studies: ?CT ANGIO HEAD NECK W WO CM ? ?Result Date: 06/14/2021 ?CLINICAL DATA:  Neuro deficit, acute, stroke suspected; code stroke follow-up, blurry vision EXAM: CT ANGIOGRAPHY HEAD AND NECK TECHNIQUE: Multidetector CT imaging of the head and neck was performed using the standard protocol during bolus administration of intravenous contrast. Multiplanar CT image reconstructions and MIPs were obtained to evaluate the vascular anatomy. Carotid stenosis measurements (when applicable) are obtained utilizing NASCET criteria, using the distal internal carotid diameter as the denominator. RADIATION DOSE REDUCTION: This exam was performed according to the departmental dose-optimization program which includes automated exposure control, adjustment of the mA and/or kV according to patient size and/or use of iterative reconstruction technique. CONTRAST:  25mL OMNIPAQUE IOHEXOL 350 MG/ML SOLN COMPARISON:  02/28/2020 FINDINGS: CT HEAD Brain: There is no acute intracranial hemorrhage, mass effect, or edema. Gray-white differentiation is preserved. There is no extra-axial fluid collection. Ventricles and sulci are within normal limits in size and configuration. Vascular: No hyperdense vessel or unexpected calcification. Skull: Calvarium is unremarkable. Sinuses/Orbits: No acute finding. Other: None. Review of the MIP images  confirms the above findings CTA NECK Aortic arch: Great vessel origins are patent. Right carotid system: Patent. Calcified plaque along the proximal internal carotid with less than 50% stenosis. Appearance is similar to the prior study. Left carotid system: Patent. Interval endarterectomy. No stenosis. Partially retropharyngeal course of the ICA. Vertebral arteries: Patent and codominant.  No stenosis. Skeleton: Degenerative changes of the cervical spine. Other neck: Unremarkable. Upper chest: No apical lung mass. Review of the MIP images confirms the above findings CTA HEAD Anterior circulation: Intracranial internal carotid arteries are patent with mild calcified plaque. Anterior and middle cerebral arteries are patent. Posterior circulation: Intracranial vertebral arteries are patent. Basilar artery is patent. Major cerebellar artery origins are patent. Bilateral posterior communicating arteries are present. Posterior cerebral arteries are patent. Venous sinuses: Patent as allowed by contrast bolus timing. Review of the MIP images confirms the above findings IMPRESSION: No acute intracranial abnormality. No large vessel occlusion, hemodynamically significant stenosis, or evidence of dissection. Interval left carotid endarterectomy. Electronically Signed   By: Guadlupe Spanish M.D.   On: 06/14/2021 12:02  ? ?MR BRAIN WO CONTRAST ? ?Result Date: 06/14/2021 ?CLINICAL DATA:  Blurred vision with stroke suspected EXAM: MRI HEAD WITHOUT CONTRAST TECHNIQUE: Multiplanar, multiecho pulse sequences of the brain and surrounding structures were obtained without intravenous contrast. COMPARISON:  Yesterday FINDINGS: Brain: No acute infarction, hemorrhage, hydrocephalus, extra-axial collection or mass lesion. Mild for age chronic small vessel ischemia in the periventricular white matter, less than typical for age. Age normal brain  volume Vascular: Normal flow voids. Skull and upper cervical spine: Normal marrow signal. Degenerative  facet spurring. Sinuses/Orbits: Negative. IMPRESSION: Unremarkable brain MRI for age. Electronically Signed   By: Tiburcio Pea M.D.   On: 06/14/2021 07:13  ? ?US Carotid Bilateral ? ?Result Date: 4/6/2

## 2021-06-14 NOTE — Progress Notes (Signed)
?  ?       ?PROGRESS NOTE ? ?[name] ION:[id number] DOB: 08-14-1945 DOA: 06/13/2021 ?PCP: Carylon Perches, MD ? ?Brief History:  ?76 year old female with a history of stroke, hypertension, hyperlipidemia, GERD, carotid stenosis status post left carotid endarterectomy (03/27/20) presenting with blurry vision that began around 8 PM on 06/13/2021 while she was watching television.  She subsequently went to the bathroom and noted that she had trouble seeing herself in the mirror secondary to the blurry vision.  She denied any focal extremity weakness, dysesthesias, dysarthria, or syncope.  However, she did describe some dizziness around 3 to 4 PM on 06/13/2021.  She denied any fevers, chills, headache, neck pain, chest pain, shortness breath, nausea, vomiting, diarrhea, abdominal pain, dysuria, hematuria.  There is been no new changes in her medications.  She is compliant with her aspirin and Lipitor.  She states that by the time she arrived to the emergency department her had begun to improve. ?In the ED, the patient was afebrile and hemodynamically stable with oxygen saturation 99% on room air.  Code stroke was activated.  CT of the brain was unremarkable.  The patient was seen by tele-neurology.  WBC 9.6, hemoglobin 13.4, platelets 253,000.  Sodium 130, potassium 3.8, bicarbonate 25, serum creatinine 0.81.  UA was negative for pyuria.  CT of the brain was negative for acute findings.  ? ? ? ?Assessment and Plan: ?* Blurred vision ?Concerned about stroke/TIA--Appreciate Neurology Consult ?-PT/OT evaluation ?-Speech therapy eval ?-CT brain--neg ?-MRI brain-- ?-MRA brain-- ?-Carotid Duplex-- ?-06/11/21 Echo--EF 60 to 65%, no WMA, trivial MR, mild AS. ?-LDL--96 ?-HbA1C-- ?-Antiplatelet--ASA 81 mg ? ?Left carotid artery stenosis ?S/p Left CEA 03/27/2020 (Dr. Darrick Penna) ?US carotid on 02/16/21 showed bilateral 1-39% stenosis ?Continue ASA ? ?Mixed hyperlipidemia ?Continue Statin ?LDL96 ?Increase lipitor to 40 mg ? ?Hyperglycemia ?Follow  up A1C ? ?Obesity (BMI 30-39.9) ?BMI 34.19 ?Lifestyle modification ? ?Essential hypertension ?Allow for permissive HTN ?Hydralazine prn SBP>220 ? ?GERD (gastroesophageal reflux disease) ?Continue PPI ? ? ? ? ? ?Family Communication:   no Family at bedside ? ?Consultants:  neurology ? ?Code Status:  FULL  ? ?DVT Prophylaxis:  [place] Lovenox ? ? ?Procedures: ?As Listed in Progress Note Above ? ?Antibiotics: ?None ? ? ? ? ? ? ?Subjective: ? ?Patient denies fevers, chills, headache, chest pain, dyspnea, nausea, vomiting, diarrhea, abdominal pain, dysuria, hematuria, hematochezia, and melena. ? ?Objective: ?Vitals:  ? 06/14/21 0430 06/14/21 0555 06/14/21 0556 06/14/21 0630  ?BP: 138/63 139/64    ?Pulse: 71 76 68 74  ?Resp: 17 (!) 29 16 20   ?Temp:      ?TempSrc:      ?SpO2: 95% 99% 99% 97%  ?Weight:      ?Height:      ? ? ?Intake/Output Summary (Last 24 hours) at 06/14/2021 08/14/2021 ?Last data filed at 06/13/2021 2232 ?Gross per 24 hour  ?Intake 500.44 ml  ?Output --  ?Net 500.44 ml  ? ?Weight change:  ?Exam: ? ?General:  Pt is alert, follows commands appropriately, not in acute distress ?HEENT: No icterus, No thrush, No neck mass, [place]/AT ?Cardiovascular: RRR, S1/S2, no rubs, no gallops ?Respiratory: CTA bilaterally, no wheezing, no crackles, no rhonchi ?Abdomen: Soft/+BS, non tender, non distended, no guarding ?Extremities: No edema, No lymphangitis, No petechiae, No rashes, no synovitis ?Neuro:  CN II-XII intact, strength 4/5 in RUE, RLE, strength 4/5 LUE, LLE; sensation intact bilateral; no dysmetria; babinski equivocal ? ? ? ?Data Reviewed: ?I have personally reviewed following labs and  imaging studies ?Basic Metabolic Panel: ?Recent Labs  ?Lab 06/13/21 ?2110 06/13/21 ?2115  ?NA 138 139  ?K 3.8 3.9  ?CL 105 103  ?CO2 25  --   ?GLUCOSE 144* 136*  ?BUN 20 18  ?CREATININE 0.81 0.80  ?CALCIUM 9.0  --   ? ?Liver Function Tests: ?Recent Labs  ?Lab 06/13/21 ?2110  ?AST 16  ?ALT 21  ?ALKPHOS 94  ?BILITOT 0.5  ?PROT 7.1  ?ALBUMIN 4.0   ? ?No results for input(s): LIPASE, AMYLASE in the last 168 hours. ?No results for input(s): AMMONIA in the last 168 hours. ?Coagulation Profile: ?Recent Labs  ?Lab 06/13/21 ?2110  ?INR 0.9  ? ?CBC: ?Recent Labs  ?Lab 06/13/21 ?2110 06/13/21 ?2115  ?WBC 9.6  --   ?NEUTROABS 4.7  --   ?HGB 13.4 14.6  ?HCT 39.8 43.0  ?MCV 90.2  --   ?PLT 253  --   ? ?Cardiac Enzymes: ?No results for input(s): CKTOTAL, CKMB, CKMBINDEX, TROPONINI in the last 168 hours. ?BNP: ?Invalid input(s): POCBNP ?CBG: ?No results for input(s): GLUCAP in the last 168 hours. ?HbA1C: ?No results for input(s): HGBA1C in the last 72 hours. ?Urine analysis: ?   ?Component Value Date/Time  ? COLORURINE STRAW (A) 06/13/2021 2142  ? APPEARANCEUR CLEAR 06/13/2021 2142  ? LABSPEC 1.006 06/13/2021 2142  ? PHURINE 5.0 06/13/2021 2142  ? GLUCOSEU NEGATIVE 06/13/2021 2142  ? HGBUR SMALL (A) 06/13/2021 2142  ? BILIRUBINUR NEGATIVE 06/13/2021 2142  ? KETONESUR NEGATIVE 06/13/2021 2142  ? PROTEINUR NEGATIVE 06/13/2021 2142  ? NITRITE NEGATIVE 06/13/2021 2142  ? LEUKOCYTESUR NEGATIVE 06/13/2021 2142  ? ?Sepsis Labs: ?@LABRCNTIP (procalcitonin:4,lacticidven:4) ?) ?Recent Results (from the past 240 hour(s))  ?Resp Panel by RT-PCR (Flu A&B, Covid) Nasopharyngeal Swab     Status: None  ? Collection Time: 06/13/21  9:09 PM  ? Specimen: Nasopharyngeal Swab; Nasopharyngeal(NP) swabs in vial transport medium  ?Result Value Ref Range Status  ? SARS Coronavirus 2 by RT PCR NEGATIVE NEGATIVE Final  ?  Comment: (NOTE) ?SARS-CoV-2 target nucleic acids are NOT DETECTED. ? ?The SARS-CoV-2 RNA is generally detectable in upper respiratory ?specimens during the acute phase of infection. The lowest ?concentration of SARS-CoV-2 viral copies this assay can detect is ?138 copies/mL. A negative result does not preclude SARS-Cov-2 ?infection and should not be used as the sole basis for treatment or ?other patient management decisions. A negative result may occur with  ?improper specimen  collection/handling, submission of specimen other ?than nasopharyngeal swab, presence of viral mutation(s) within the ?areas targeted by this assay, and inadequate number of viral ?copies(<138 copies/mL). A negative result must be combined with ?clinical observations, patient history, and epidemiological ?information. The expected result is Negative. ? ?Fact Sheet for Patients:  ?08/13/21 ? ?Fact Sheet for Healthcare Providers:  ?BloggerCourse.com ? ?This test is no t yet approved or cleared by the [place] FDA and  ?has been authorized for detection and/or diagnosis of SARS-CoV-2 by ?FDA under an Emergency Use Authorization (EUA). This EUA will remain  ?in effect (meaning this test can be used) for the duration of the ?COVID-19 declaration under Section 564(b)(1) of the Act, 21 ?U.S.C.section 360bbb-3(b)(1), unless the authorization is terminated  ?or revoked sooner.  ? ? ?  ? Influenza A by PCR NEGATIVE NEGATIVE Final  ? Influenza B by PCR NEGATIVE NEGATIVE Final  ?  Comment: (NOTE) ?The Xpert Xpress SARS-CoV-2/FLU/RSV plus assay is intended as an aid ?in the diagnosis of influenza from Nasopharyngeal swab specimens and ?should not be  used as a sole basis for treatment. Nasal washings and ?aspirates are unacceptable for Xpert Xpress SARS-CoV-2/FLU/RSV ?testing. ? ?Fact Sheet for Patients: ?BloggerCourse.comhttps://www.fda.gov/media/152166/download ? ?Fact Sheet for Healthcare Providers: ?SeriousBroker.ithttps://www.fda.gov/media/152162/download ? ?This test is not yet approved or cleared by the [place] FDA and ?has been authorized for detection and/or diagnosis of SARS-CoV-2 by ?FDA under an Emergency Use Authorization (EUA). This EUA will remain ?in effect (meaning this test can be used) for the duration of the ?COVID-19 declaration under Section 564(b)(1) of the Act, 21 U.S.C. ?section 360bbb-3(b)(1), unless the authorization is terminated or ?revoked. ? ?Performed at [facility], [address] [place], [place] [address] ?  ?  ? ?Scheduled Meds: ?  stroke: mapping our early stages of recovery book   Does not apply Once  ? aspirin EC  81 mg Oral Daily  ? atorvastatin  20 mg Oral QH

## 2021-06-14 NOTE — Assessment & Plan Note (Signed)
BMI 34.19 ?Lifestyle modification ?

## 2021-06-14 NOTE — Telephone Encounter (Signed)
Pt enrolled in [employer]  

## 2021-06-14 NOTE — Progress Notes (Signed)
?  Transition of Care (TOC) Screening Note ? ? ?Patient Details  ?Name: [name] ?Date of Birth: 01-Sep-1945 ? ? ?Transition of Care (TOC) CM/SW Contact:    ?Adaleen Hulgan D, LCSW ?Phone Number: ?06/14/2021, 12:23 PM ? ? ? ?Transition of Care Department [facility]) has reviewed patient and no TOC needs have been identified at this time. We will continue to monitor patient advancement through interdisciplinary progression rounds. If new patient transition needs arise, please place a TOC consult. ?  ?

## 2021-06-14 NOTE — Consult Note (Addendum)
I connected with  [name] on 06/14/21 by a video enabled telemedicine application and verified that I am speaking with the correct person using two identifiers. ?  ?I discussed the limitations of evaluation and management by telemedicine. The patient expressed understanding and agreed to proceed. ? ?Location of patient: [facility] ?Location of physician: [facility] ? ?Neurology Consultation ?Reason for Consult: blurred vision ?Referring Physician: Dr Shanon Brow Tat ? ?CC: blurred vision ? ?History is obtained from: patient, chart review ? ?HPI: [name] is a 76 y.o. female with past medical history of hypertension, hyperlipidemia, aortic valve stenosis, left carotid stenosis status post left carotid endarterectomy who presented with blurred vision and feeling "woozy".  Patient states she was out in the sun for about 2 hours and therefore felt "woozy" like she is tired, denies feeling lightheaded, vertiginous.  In the evening around 8 PM she went to shower and after she came around she noticed that her vision was blurry and she could not see herself clearly in the mirror.  She came out and sat down but her vision still blurry.  She then called her son who came to drive her to the emergency room.  She states while in the car, she noticed that she was seeing four headlights two on top of each other.  States her symptoms lasted for few hours and have since completely resolved.  Denies ever having similar symptoms in the past.  States she takes aspirin 81 mg daily.  Also reports mild neck pain on the left side of the neck in the front since left CEA.  ? ?Last known normal: 06/13/2021 at 1500 ?Symptoms happened at home ?No tPA as NIHSS 0 ?No thrombectomy as no large vessel occlusion ?MRS  0 ?Blood pressure on arrival 165/79, blood glucose 144 ?NIHSS 0 ? ? ?ROS: All other systems reviewed and negative except as noted in the HPI.  ? ?Past Medical History:  ?Diagnosis Date  ? Bacterial overgrowth syndrome    ? Positive HBT  ? Carotid artery occlusion   ? GERD (gastroesophageal reflux disease)   ? Heart murmur   ? mild AS 03/21/20 echo  ? Hypercholesteremia   ? Hypertension   ? ? ?Family History  ?Problem Relation Age of Onset  ? Colon cancer Neg Hx   ? ?Social History:  reports that she has never smoked. She has never used smokeless tobacco. She reports that she does not drink alcohol and does not use drugs. ? ?Exam: ?Current vital signs: ?BP (!) 162/67   Pulse 81   Temp 97.6 ?F (36.4 ?C) (Oral)   Resp 19   Ht 5' (1.524 m)   Wt 79.4 kg   SpO2 97%   BMI 34.19 kg/m?  ?Vital signs in last 24 hours: ?Temp:  [97.2 ?F (36.2 ?C)-97.6 ?F (36.4 ?C)] 97.6 ?F (36.4 ?C) (04/06 [id number]) ?Pulse Rate:  [68-98] 81 (04/06 0915) ?Resp:  [14-29] 19 (04/06 0915) ?BP: (116-165)/(55-102) 162/67 (04/06 0915) ?SpO2:  [93 %-100 %] 97 % (04/06 0915) ?Weight:  [79.4 kg] 79.4 kg (04/05 2047) ? ? ?Physical Exam  ?Constitutional: Appears well-developed and well-nourished.  ?Psych: Affect appropriate to situation ?Neuro: AOx3, no evidence of aphasia, PERRLA, EOMI, rest of the cranial nerves appear grossly intact, antigravity strength without drift in upper extremities, FTN intact bilaterally, sensations normal to light touch ? ? ?I have reviewed labs in epic and the results pertinent to this consultation are: ?CBC:  ?Recent Labs  ?Lab 06/13/21 ?2110  06/13/21 ?2115  ?WBC 9.6  --   ?NEUTROABS 4.7  --   ?HGB 13.4 14.6  ?HCT 39.8 43.0  ?MCV 90.2  --   ?PLT 253  --   ? ? ?Basic Metabolic Panel:  ?Lab Results  ?Component Value Date  ? NA 139 06/13/2021  ? K 3.9 06/13/2021  ? CO2 25 06/13/2021  ? GLUCOSE 136 (H) 06/13/2021  ? BUN 18 06/13/2021  ? CREATININE 0.80 06/13/2021  ? CALCIUM 9.0 06/13/2021  ? GFRNONAA >60 06/13/2021  ? GFRAA >60 03/02/2019  ? ?Lipid Panel:  ?Lab Results  ?Component Value Date  ? [place] 96 06/14/2021  ? ?HgbA1c:  ?Lab Results  ?Component Value Date  ? HGBA1C 5.4 06/14/2021  ? ?Urine Drug Screen:  ?   ?Component Value Date/Time   ? [place] DETECTED 06/13/2021 2142  ? [place] DETECTED 06/13/2021 2142  ? [place] DETECTED 06/13/2021 2142  ? AMPHETMU NONE DETECTED 06/13/2021 2142  ? [place] DETECTED 06/13/2021 2142  ? LABBARB NONE DETECTED 06/13/2021 2142  ?  ?Alcohol Level  ?   ?Component Value Date/Time  ? ETH <10 06/13/2021 2110  ? ? ? ?I have reviewed the images obtained: ? ?CT head without contrast 06/13/2001: No acute abnormality ? ?MRI brain without contrast 06/14/2021: No acute abnormality. ? ? ?ASSESSMENT/PLAN: 76 year old female presented with transient blurred vision and feeling "woozy" ? ?TIA ?-Etiology: Suspected atheroembolic versus cardioembolic versus dissection ? ?Recommendations: ?-Obtain CTA head and neck with contrast to look for dissection, intracranial stenosis ?-Patient had TTE on 06/11/2020 which showed ejection fraction of 60 to 65% without any wall motion abnormalities and no PFO.  Therefore will not repeat ?-If CTA head and neck does not show any evidence of dissection, recommend 30-day event monitor at the time of discharge ?Recommend aspirin 81 mg daily and Plavix 75 mg daily for 3 weeks followed by monotherapy with aspirin 81 mg daily ?-Recommend atorvastatin 40 mg daily for secondary stroke prevention ?-Goal blood pressure: Normotension ?-Discussed stroke risk factors, stroke symptoms including BEFAST ?-Follow-up with neurology in 3 months ?-Plan discussed with Dr.Tat ? ?ADDENDUM ?-CTA head and neck with contrast 06/14/2021: No acute intracranial abnormality.  No large vessel occlusion, hemodynamically significant stenosis or dissection.  Interval left carotid endarterectomy. ? ?I have spent a total of   37 minutes with the patient reviewing hospital notes,  test results, labs and examining the patient as well as establishing an assessment and plan that was discussed personally with the patient.  > 50% of time was spent in direct patient care. ?  ?Thank you for allowing [place] to participate in the care  of this patient. If you have any further questions, please contact  me or neurohospitalist.  ? ?Zeb Comfort ?Epilepsy ?[facility] ? ? ? ? ?

## 2021-06-14 NOTE — Assessment & Plan Note (Addendum)
-  Appreciate Neurology Consult ?-PT/OT evaluation--no follow up needed ?-CT brain--neg ?-MRI brain--neg for acute infarct ?-Carotid Duplex--no hemodynamically significant stenosis ?-CTA H&N--No large vessel occlusion, hemodynamically significant stenosis, or ?evidence of dissection. Interval left carotid endarterectomy ?-06/11/21 Echo--EF 60 to 65%, no WMA, trivial MR, mild AS. ?-LDL--96 ?-HbA1C--5.4 ?-Antiplatelet--ASA 81 mg & plavix 75 mg x 21 days, then ASA 81 daily thereafter ?Cardiology was contacted to help arrange 30 day monitor after d/c ?

## 2021-06-14 NOTE — Assessment & Plan Note (Addendum)
Allow for permissive HTN during hospitalization ?Hydralazine prn SBP>220 ?Resume home meds after dc ?

## 2021-06-14 NOTE — Telephone Encounter (Signed)
-----   Message from Catarina Hartshorn, MD sent at 06/14/2021 12:26 PM EDT ----- ?Regarding: 30 day event monitor ?Can you please arrange for this patient to have 30 day monitor for stroke? ?Please send results to Dr. Carylon Perches. ? ?Thank you, ? ?Onalee Hua ? ?

## 2021-06-14 NOTE — ED Notes (Signed)
Neuro cart placed at bedside 

## 2021-06-14 NOTE — Assessment & Plan Note (Addendum)
Follow up A1C--5.4 ?

## 2021-06-14 NOTE — Assessment & Plan Note (Addendum)
S/p Left CEA 03/27/2020 (Dr. Darrick Penna) ?US carotid on 02/16/21 showed bilateral 1-39% stenosis ?Continue ASA ?CTA H&N--neg for stenosis or dissection ?

## 2021-06-14 NOTE — Evaluation (Signed)
Occupational Therapy Evaluation ?Patient Details ?Name: [name] ?MRN: [id number] ?DOB: 10-18-1945 ?Today's Date: 06/14/2021 ? ? ?History of Present Illness [name] is a 76 y.o. female with medical history significant for GERD, hypertension, hyperlipidemia, obesity, aortic valve stenosis, left carotid stenosis status post left carotid endarterectomy who presents to the emergency department due to sudden onset of blurred vision which occurred around 7 PM to 8 PM while watching TV, she states that she got up to wash her face and that the blurry vision persisted while in front of the mirror.  She complained of feeling dizzy which started around 3 to 4 PM prior to the onset of blurred vision as described above.  Patient denies chest pain, shortness of breath, nausea, vomiting, headache, fever, chills or abdominal pain. (per MD note)  ? ?Clinical Impression ?  ?Pt agreeable to OT and PT co-evaluation. Pt appears to be at baseline levels with report that blurry vision has resolved. Pt demonstrated ability to read small number print on board several feet away. Pt ambulating in hall independently with mild balance deficits and reports of chronic bilateral knee pain. Pt is not recommended for further acute OT services and will be discharged to care of nursing staff for remaining length of stay.  ?   ? ?Recommendations for follow up therapy are one component of a multi-disciplinary discharge planning process, led by the attending physician.  Recommendations may be updated based on patient status, additional functional criteria and insurance authorization.  ? ?Follow Up Recommendations ? No OT follow up  ?  ?Assistance Recommended at Discharge None  ?Patient can return home with the following   ? ?  ?Functional Status Assessment ? Patient has not had a recent decline in their functional status  ?Equipment Recommendations ? None recommended by OT  ?  ?Recommendations for Other Services   ? ? ?  ?Precautions / Restrictions  Precautions ?Precautions: None ?Restrictions ?Weight Bearing Restrictions: No  ? ?  ? ?Mobility Bed Mobility ?Overal bed mobility: Independent ?  ?  ?  ?  ?  ?  ?  ?  ? ?Transfers ?Overall transfer level: Independent ?  ?  ?  ?  ?  ?  ?  ?  ?  ?  ? ?  ?Balance Overall balance assessment: Mild deficits observed, not formally tested ?  ?  ?  ?  ?  ?  ?  ?  ?  ?  ?  ?  ?  ?  ?  ?  ?  ?  ?   ? ?ADL either performed or assessed with clinical judgement  ? ?ADL Overall ADL's : Independent ?  ?  ?  ?  ?  ?  ?  ?  ?  ?  ?  ?  ?  ?  ?  ?  ?  ?  ?  ?   ? ? ? ?Vision Baseline Vision/History: 0 No visual deficits ?Ability to See in Adequate Light: 0 Adequate ?Patient Visual Report: Blurring of vision (Has since resolved.) ?Vision Assessment?: No apparent visual deficits  ?   ?Perception   ?  ?Praxis   ?  ? ?Pertinent Vitals/Pain Pain Assessment ?Pain Assessment: Faces ?Faces Pain Scale: Hurts a little bit ?Pain Location: bilateral knee pain when walking ?Pain Descriptors / Indicators:  (chronic) ?Pain Intervention(s): Limited activity within patient's tolerance, Monitored during session, Repositioned  ? ? ? ?Hand Dominance Right ?  ?Extremity/Trunk Assessment Upper Extremity Assessment ?Upper Extremity  Assessment: Overall WFL for tasks assessed ?  ?Lower Extremity Assessment ?Lower Extremity Assessment: Defer to PT evaluation ?  ?Cervical / Trunk Assessment ?Cervical / Trunk Assessment: Normal ?  ?Communication Communication ?Communication: No difficulties ?  ?Cognition Arousal/Alertness: Awake/alert ?Behavior During Therapy: [facility] for tasks assessed/performed ?Overall Cognitive Status: Within Functional Limits for tasks assessed ?  ?  ?  ?  ?  ?  ?  ?  ?  ?  ?  ?  ?  ?  ?  ?  ?  ?  ?  ?   ? ?  ?   ?  ?    ? ? ?Home Living Family/patient expects to be discharged to:: Private residence ?Living Arrangements: Alone ?  ?Type of Home: House ?Home Access: Level entry ?  ?  ?Home Layout: One level ?  ?  ?Bathroom Shower/Tub: Tub/shower  unit ?  ?Bathroom Toilet: Standard ?Bathroom Accessibility: Yes ?How Accessible: Accessible via walker ?Home Equipment: None ?  ?  ?  ? ?  ?Prior Functioning/Environment Prior Level of Function : Independent/Modified Independent ?  ?  ?  ?  ?  ?  ?  ?  ?  ? ?  ?  ?   ?  ?   ?    ?  ?OT Goals(Current goals can be found in the care plan section) Acute Rehab OT Goals ?Patient Stated Goal: return home  ?   ?  ? ?Co-evaluation PT/OT/SLP Co-Evaluation/Treatment: Yes ?Reason for Co-Treatment: To address functional/ADL transfers ?  ?OT goals addressed during session: ADL's and self-care ?  ? ?  ?AM-PAC OT "6 Clicks" Daily Activity     ?Outcome Measure Help from another person eating meals?: None ?Help from another person taking care of personal grooming?: None ?Help from another person toileting, which includes using toliet, bedpan, or urinal?: None ?Help from another person bathing (including washing, rinsing, drying)?: None ?Help from another person to put on and taking off regular upper body clothing?: None ?Help from another person to put on and taking off regular lower body clothing?: None ?6 Click Score: 24 ?  ?End of Session   ? ?Activity Tolerance: Patient tolerated treatment well ?Patient left: in bed;with call bell/phone within reach ? ?OT Visit Diagnosis: Low vision, both eyes (H54.2);Other symptoms and signs involving the nervous system (R29.898)  ?              ?Time: 8144-8185 ?OT Time Calculation (min): 9 min ?Charges:  OT General Charges ?$OT Visit: 1 Visit ?OT Evaluation ?$OT Eval Low Complexity: 1 Low ? ?Danie Chandler OT, MOT ? ?Danie Chandler ?06/14/2021, 9:00 AM ?

## 2021-06-14 NOTE — Hospital Course (Addendum)
76 year old female with a history of stroke, hypertension, hyperlipidemia, GERD, carotid stenosis status post left carotid endarterectomy (03/27/20) presenting with blurry vision that began around 8 PM on 06/13/2021 while she was watching television.  She subsequently went to the bathroom and noted that she had trouble seeing herself in the mirror secondary to the blurry vision.  She denied any focal extremity weakness, dysesthesias, dysarthria, or syncope.  However, she did describe some dizziness around 3 to 4 PM on 06/13/2021.  She denied any fevers, chills, headache, neck pain, chest pain, shortness breath, nausea, vomiting, diarrhea, abdominal pain, dysuria, hematuria.  There is been no new changes in her medications.  She is compliant with her aspirin and Lipitor.  She states that by the time she arrived to the emergency department her had begun to improve. ?In the ED, the patient was afebrile and hemodynamically stable with oxygen saturation 99% on room air.  Code stroke was activated.  CT of the brain was unremarkable.  The patient was seen by tele-neurology.  WBC 9.6, hemoglobin 13.4, platelets 253,000.  Sodium 130, potassium 3.8, bicarbonate 25, serum creatinine 0.81.  UA was negative for pyuria.  CT of the brain was negative for acute findings. ?

## 2021-06-14 NOTE — Assessment & Plan Note (Signed)
Continue PPI ?

## 2021-06-22 DIAGNOSIS — E785 Hyperlipidemia, unspecified: Secondary | ICD-10-CM | POA: Diagnosis not present

## 2021-06-22 DIAGNOSIS — Z8673 Personal history of transient ischemic attack (TIA), and cerebral infarction without residual deficits: Secondary | ICD-10-CM | POA: Diagnosis not present

## 2021-07-16 ENCOUNTER — Other Ambulatory Visit: Payer: Self-pay

## 2021-07-16 DIAGNOSIS — I350 Nonrheumatic aortic (valve) stenosis: Secondary | ICD-10-CM

## 2021-08-10 ENCOUNTER — Ambulatory Visit: Payer: Medicare Other | Admitting: Urology

## 2021-08-10 VITALS — BP 167/78 | HR 90

## 2021-08-10 DIAGNOSIS — R32 Unspecified urinary incontinence: Secondary | ICD-10-CM

## 2021-08-10 LAB — URINALYSIS, ROUTINE W REFLEX MICROSCOPIC
Bilirubin, UA: NEGATIVE
Glucose, UA: NEGATIVE
Nitrite, UA: NEGATIVE
Protein,UA: NEGATIVE
RBC, UA: NEGATIVE
Specific Gravity, UA: 1.025 (ref 1.005–1.030)
Urobilinogen, Ur: 0.2 mg/dL (ref 0.2–1.0)
pH, UA: 5 (ref 5.0–7.5)

## 2021-08-10 LAB — MICROSCOPIC EXAMINATION
RBC, Urine: NONE SEEN /hpf (ref 0–2)
Renal Epithel, UA: NONE SEEN /hpf

## 2021-08-10 MED ORDER — MIRABEGRON ER 25 MG PO TB24: 25.0000 mg | ORAL_TABLET | Freq: Every day | ORAL | 0 refills | Status: DC

## 2021-08-10 NOTE — Progress Notes (Signed)
08/10/2021 11:41 AM   [place]illy [place] 14-Jun-1945 [id number]  Referring provider: Carylon Perches, MD [address] [place],  [place] [address]  Urinary incontinence   HPI: [name] is a 76yo here for evaluation of urinary incontinence. She was having issues with nocturia 5 years ago and was started on mirabegron which improved the nocturia. She stopped the medication due to cost. She now has daytime urinary frequency and urgency. She uses 3 pads during the day and 3 during the night.   Here records from [facility] are as follows: I get up too often at night to urinate. HPI: [name] is a 76 year-old female established patient who is here for urinating too frequently at night.??She first noticed the symptom approximately 11/09/2016. She usually gets up at night to urinate 2 times. She does not have nights when she does not get up to urinate at all. She does not have trouble falling back asleep once she has been woken up at night. ??She does not usually have swelling in her hands and feet during the day. She does not take a diuretic. She does not have to strain or bear down to start her urinary stream. ??11/26/2017: Over the past year she has noted worsening nocturia with associated urgency, frequency and urge incontinence. ??12/24/2017: She has nocturia 2x now on mirabegron down from 4x. ??CC: I leak when I have the urge to urinate. HPI: She does have problems getting to the bathroom in time after she has the urge to urinate. She has had an accident when she couldn't get to the bathroom in time . She has 4 episodes of urge incontinence per day. Her urge incontinence began approximately 11/09/2016. Her symptoms have gotten worse over the last year. ??She does wear protective pads. She wears 1-2 pads per day. She generally urinates every hour in the daytime. She gets up at night to urinate 4 times. She is not having problems with emptying her bladder well. ??11/26/2017: Over the past year she has noted worsening  urgency with urge incontinence. She has tried AZO which did not improve her urgency or urge incontinence. ??12/24/2017: She notes significant improvement in her urgency and urge incontinence with mirabegron 25mg      PMH: Past Medical History:  Diagnosis Date   Bacterial overgrowth syndrome    Positive HBT   Carotid artery occlusion    GERD (gastroesophageal reflux disease)    Heart murmur    mild AS 03/21/20 echo   Hypercholesteremia    Hypertension     Surgical History: Past Surgical History:  Procedure Laterality Date   ABDOMINAL HYSTERECTOMY     BACTERIAL OVERGROWTH TEST N/A 01/07/2013   Procedure: BACTERIAL OVERGROWTH TEST;  Surgeon: 01/09/2013, MD;  Location: [facility];  Service: Endoscopy;  Laterality: N/A;  7:30   Bladder Tack     x 2   COLONOSCOPY  July 2007   Dr. August 2007: normal. Due for screening 2017   ENDARTERECTOMY Left 03/27/2020   Procedure: LEFT CAROTID ENDARTERECTOMY;  Surgeon: 03/29/2020, MD;  Location: [facility] OR;  Service: Vascular;  Laterality: Left;   ESOPHAGOGASTRODUODENOSCOPY N/A 10/12/2012   RMR: Small inlet patch.  Small hiatal hernia.  Status post gastric biopsy, negative H.pylori   ESOPHAGOGASTRODUODENOSCOPY (EGD) WITH ESOPHAGEAL DILATION  Dec 2011   Dr. Jan 2012: non-critical Schatzki's ring s/p dilation with 56 F dilator, small hiatal hernia, otherwise normal   FRACTURE SURGERY     left wrist   IR ANGIO EXTERNAL CAROTID  SEL EXT CAROTID BILAT MOD SED  03/02/2019   IR ANGIO INTRA EXTRACRAN SEL INTERNAL CAROTID BILAT MOD SED  03/02/2019   IR ANGIO VERTEBRAL SEL VERTEBRAL BILAT MOD SED  03/02/2019   IR US GUIDE VASC ACCESS RIGHT  03/02/2019   KNEE ARTHROSCOPY     Bilateral (torn meniscus)   ROTATOR CUFF REPAIR     TUBAL LIGATION      Home Medications:  Allergies as of 08/10/2021       Reactions   Oxycodone Other (See Comments)   "makes me deathly sick"        Medication List        Accurate as of August 10, 2021 11:41 AM. If you have  any questions, ask your nurse or doctor.          amLODipine 5 MG tablet Commonly known as: NORVASC Take 1 tablet by mouth daily.   aspirin EC 81 MG tablet Take 81 mg by mouth daily.   atorvastatin 40 MG tablet Commonly known as: LIPITOR Take 1 tablet (40 mg total) by mouth at bedtime.   B-complex with vitamin C tablet Take 1 tablet by mouth every Monday, Tuesday, Wednesday, Thursday, and Friday.   clopidogrel 75 MG tablet Commonly known as: PLAVIX Take 1 tablet (75 mg total) by mouth daily.   esomeprazole 20 MG capsule Commonly known as: NEXIUM Take 20 mg by mouth daily as needed (acid reflux/indigestion.).   Osteo Bi-Flex One Per Day Tabs Take 1 tablet by mouth daily as needed (joint pain).        Allergies:  Allergies  Allergen Reactions   Oxycodone Other (See Comments)    "makes me deathly sick"    Family History: Family History  Problem Relation Age of Onset   Colon cancer Neg Hx     Social History:  reports that she has never smoked. She has never used smokeless tobacco. She reports that she does not drink alcohol and does not use drugs.  ROS: All other review of systems were reviewed and are negative except what is noted above in HPI  Physical Exam: BP (!) 167/78   Pulse 90   Constitutional:  Alert and oriented, No acute distress. HEENT: [place] AT, moist mucus membranes.  Trachea midline, no masses. Cardiovascular: No clubbing, cyanosis, or edema. Respiratory: Normal respiratory effort, no increased work of breathing. GI: Abdomen is soft, nontender, nondistended, no abdominal masses GU: No CVA tenderness.  Lymph: No cervical or inguinal lymphadenopathy. Skin: No rashes, bruises or suspicious lesions. Neurologic: Grossly intact, no focal deficits, moving all 4 extremities. Psychiatric: Normal mood and affect.  Laboratory Data: Lab Results  Component Value Date   WBC 9.6 06/13/2021   HGB 14.6 06/13/2021   HCT 43.0 06/13/2021   MCV 90.2  06/13/2021   PLT 253 06/13/2021    Lab Results  Component Value Date   CREATININE 0.80 06/13/2021    No results found for: PSA  No results found for: TESTOSTERONE  Lab Results  Component Value Date   HGBA1C 5.4 06/14/2021    Urinalysis    Component Value Date/Time   COLORURINE STRAW (A) 06/13/2021 2142   APPEARANCEUR CLEAR 06/13/2021 2142   LABSPEC 1.006 06/13/2021 2142   PHURINE 5.0 06/13/2021 2142   GLUCOSEU NEGATIVE 06/13/2021 2142   HGBUR SMALL (A) 06/13/2021 2142   BILIRUBINUR NEGATIVE 06/13/2021 2142   KETONESUR NEGATIVE 06/13/2021 2142   PROTEINUR NEGATIVE 06/13/2021 2142   NITRITE NEGATIVE 06/13/2021 2142   LEUKOCYTESUR NEGATIVE 06/13/2021  2142    Lab Results  Component Value Date   BACTERIA RARE (A) 06/13/2021    Pertinent Imaging:  No results found for this or any previous visit.  No results found for this or any previous visit.  No results found for this or any previous visit.  No results found for this or any previous visit.  No results found for this or any previous visit.  No results found for this or any previous visit.  No results found for this or any previous visit.  No results found for this or any previous visit.   Assessment & Plan:    1. Urinary incontinence, unspecified type We will trial Mirabegron 25mg  daily - Urinalysis, Routine w reflex microscopic - BLADDER SCAN AMB NON-IMAGING   No follow-ups on file.  Wilkie AyePatrick Aden Youngman, MD  [facility]

## 2021-08-10 NOTE — Progress Notes (Signed)
post void residual=54 

## 2021-08-21 ENCOUNTER — Encounter: Payer: Self-pay | Admitting: Urology

## 2021-08-21 NOTE — Patient Instructions (Signed)

## 2021-08-22 DIAGNOSIS — Z79899 Other long term (current) drug therapy: Secondary | ICD-10-CM | POA: Diagnosis not present

## 2021-08-22 DIAGNOSIS — E785 Hyperlipidemia, unspecified: Secondary | ICD-10-CM | POA: Diagnosis not present

## 2021-08-29 DIAGNOSIS — E785 Hyperlipidemia, unspecified: Secondary | ICD-10-CM | POA: Diagnosis not present

## 2021-08-29 DIAGNOSIS — Z8673 Personal history of transient ischemic attack (TIA), and cerebral infarction without residual deficits: Secondary | ICD-10-CM | POA: Diagnosis not present

## 2021-09-05 IMAGING — MR MR HEAD WO/W CM
6 of 11 series · 24 of 48 positions shown · IV contrast (gadavist)
Comparison: None.

CLINICAL DATA: Unilateral pulsatile tinnitus

EXAM:
MRI HEAD WITHOUT AND WITH CONTRAST
TECHNIQUE: Multiplanar, multiecho pulse sequences of the brain and surrounding
structures were obtained without and with intravenous contrast.
CONTRAST:  7.5mL GADAVIST GADOBUTROL 1 MMOL/ML IV SOLN

[Series 3: DWI · axial · 3.0mm · 0.69mm/px · z∈[-85,+62]mm · 6 of 50 slices shown]
[im 1/50]
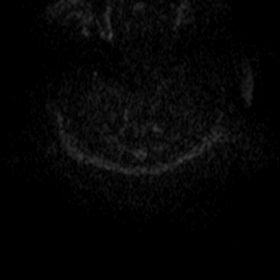
[im 10/50]
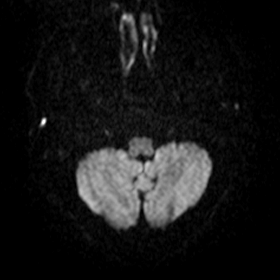
[im 20/50]
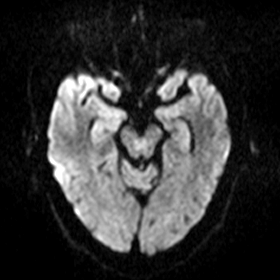
[im 30/50]
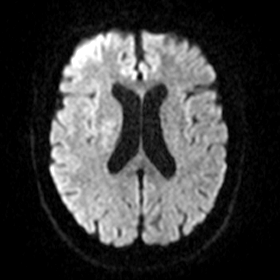
[im 40/50]
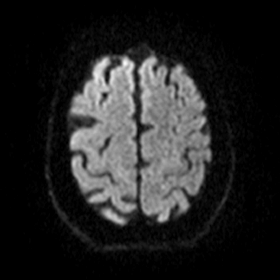
[im 50/50]
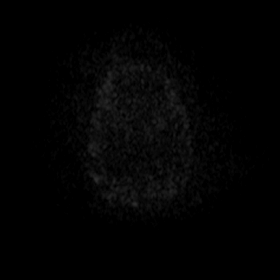

[Series 5: T2 · axial · 5.0mm · 0.52mm/px · z∈[-83,+60]mm · 3 of 23 slices shown]
[im 1/23]
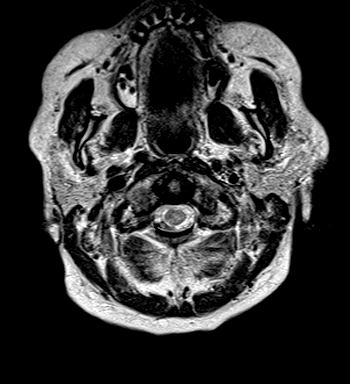
[im 12/23]
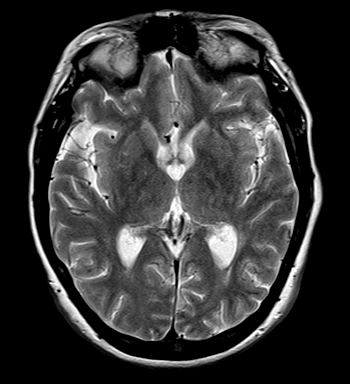
[im 23/23]
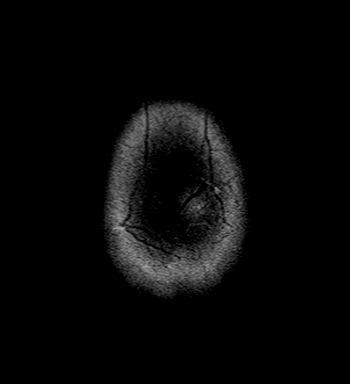

[Series 6: FLAIR · axial · 3.0mm · 0.30mm/px · z∈[-80,+57]mm · 5 of 47 slices shown]
[im 1/47]
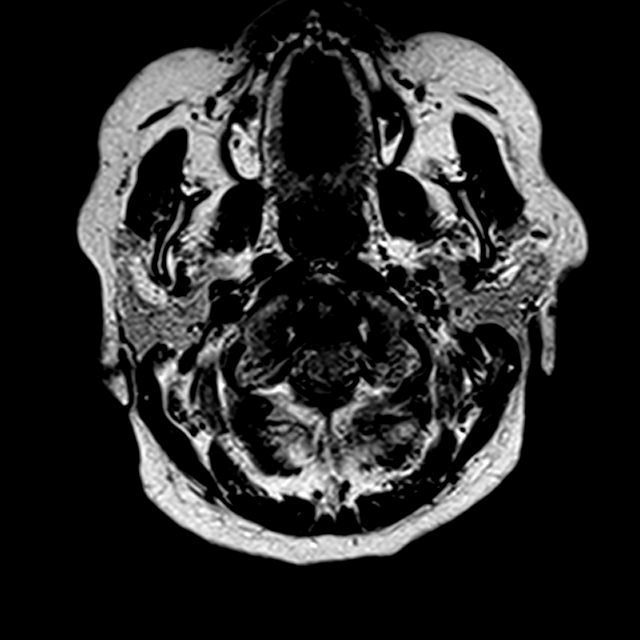
[im 12/47]
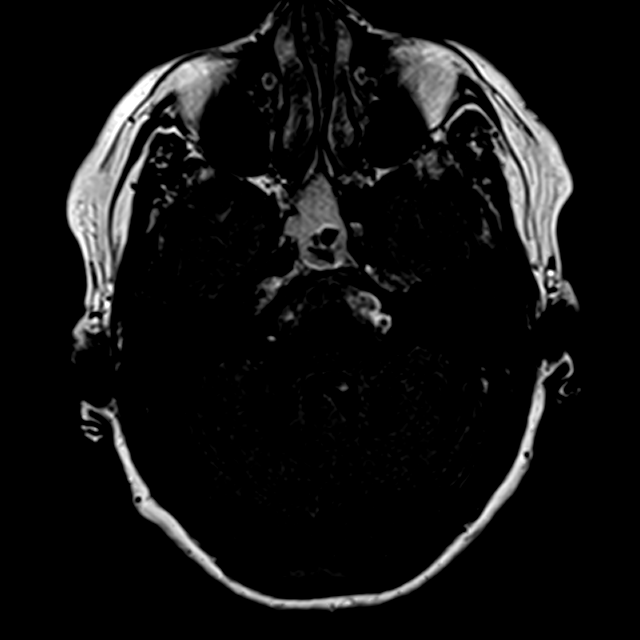
[im 24/47]
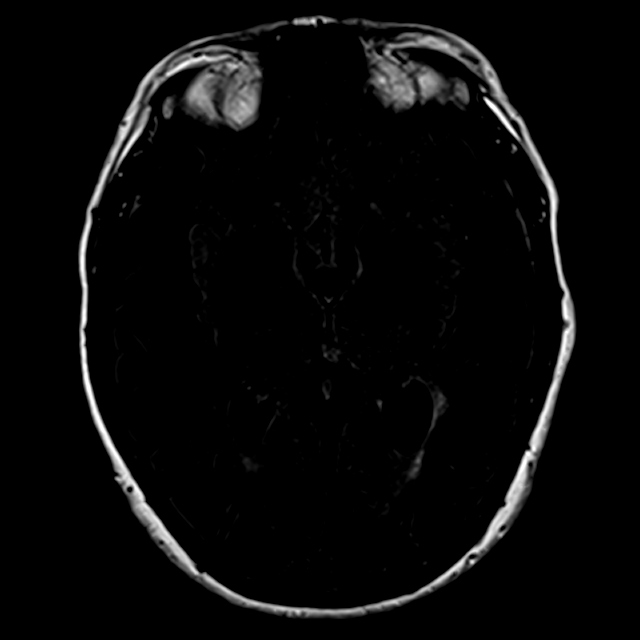
[im 35/47]
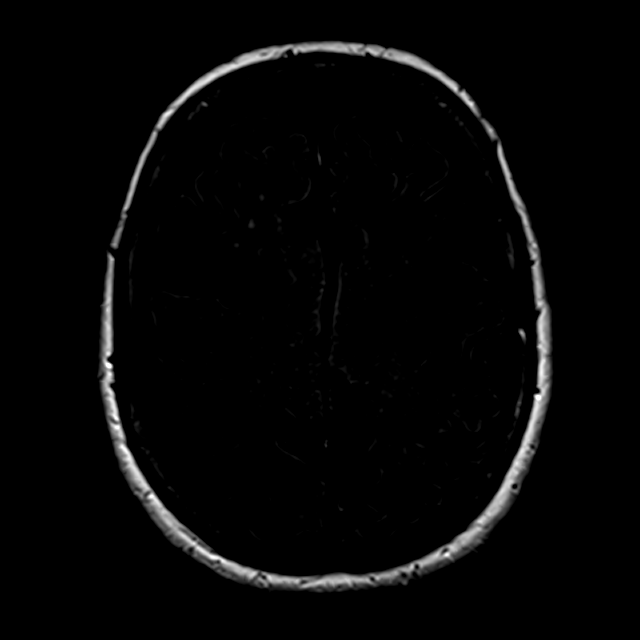
[im 47/47]
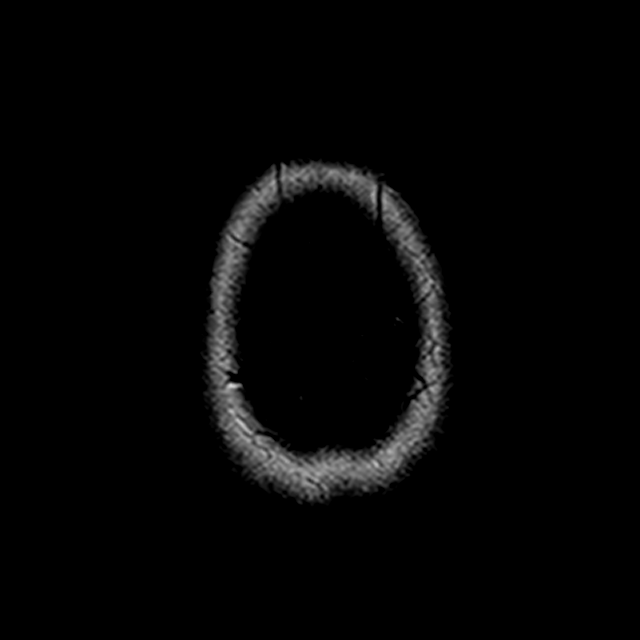

[Series 12: T1 post-contrast · coronal · 3.0mm · 0.34mm/px · 1 of 13 slices shown (1 of 3)]
[im 1/13]
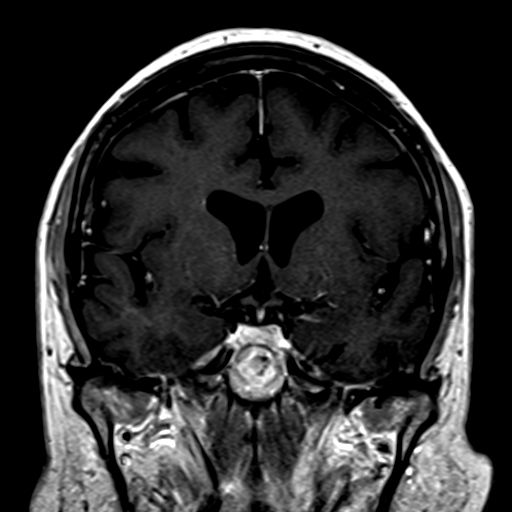

[Series 13: T1 post-contrast · axial · 3.0mm · 0.37mm/px · 1 of 13 slices shown (2 of 3)]
[im 1/13]
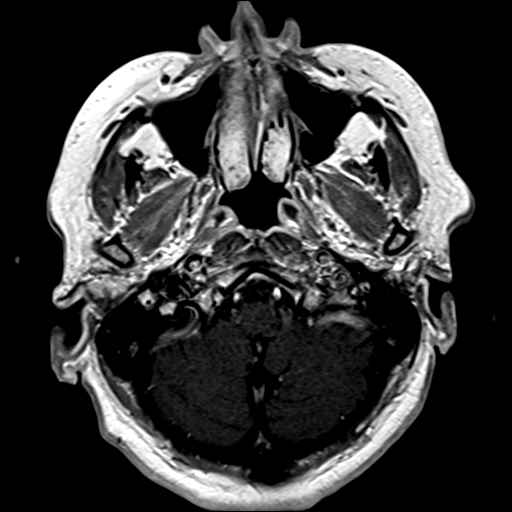

[Series 14: T1 post-contrast · axial · 2.0mm · 0.45mm/px · z∈[-96,+92]mm · 8 of 95 slices shown (3 of 3)]
[im 1/95]
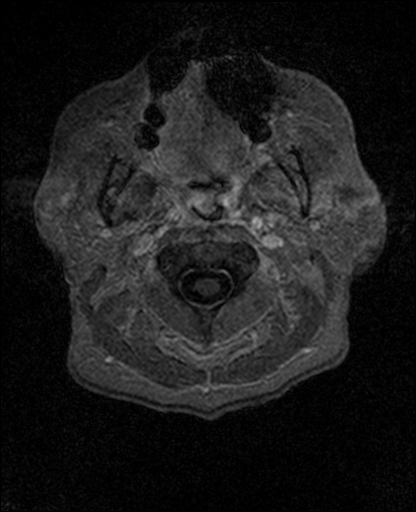
[im 19/95]
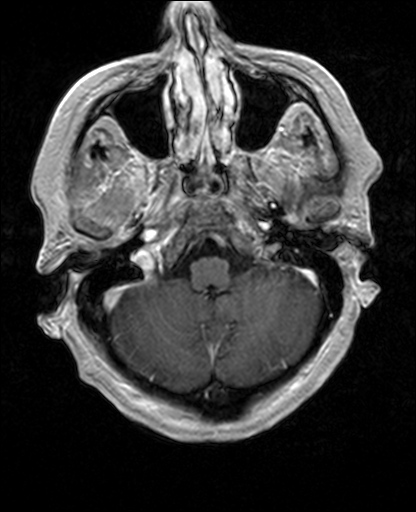
[im 29/95]
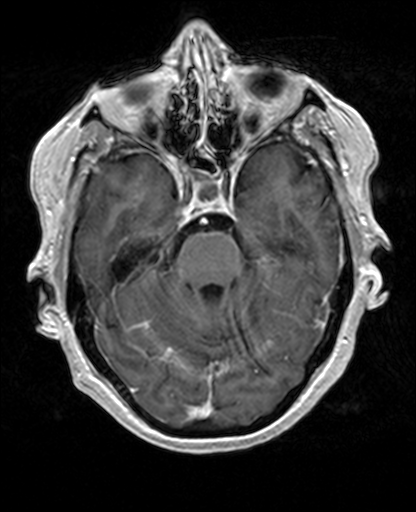
[im 38/95]
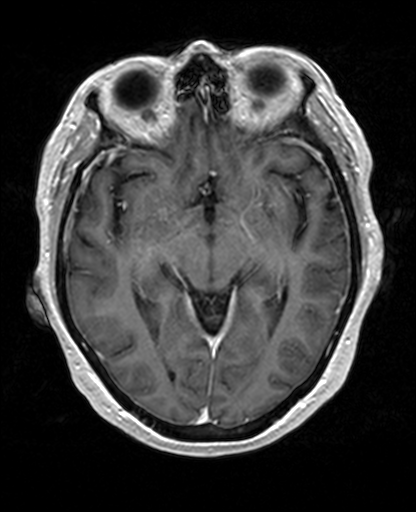
[im 57/95]
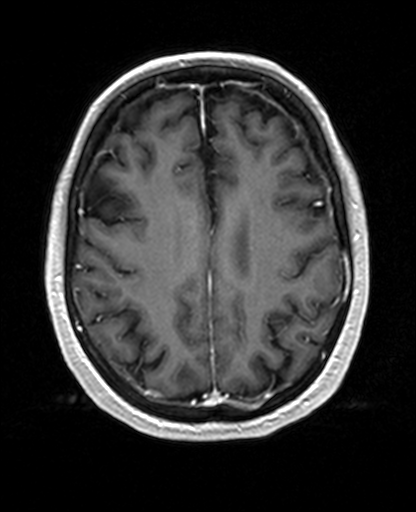
[im 66/95]
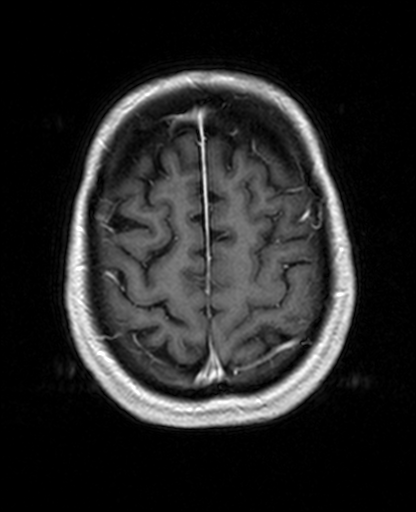
[im 76/95]
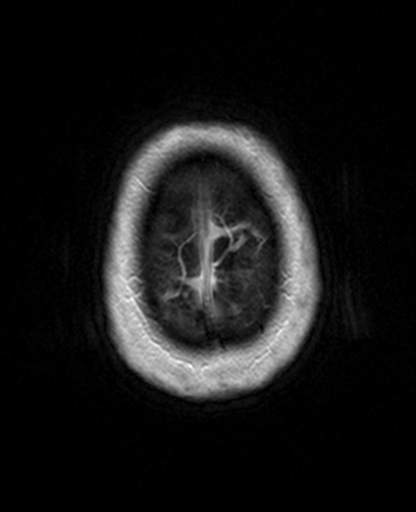
[im 95/95]
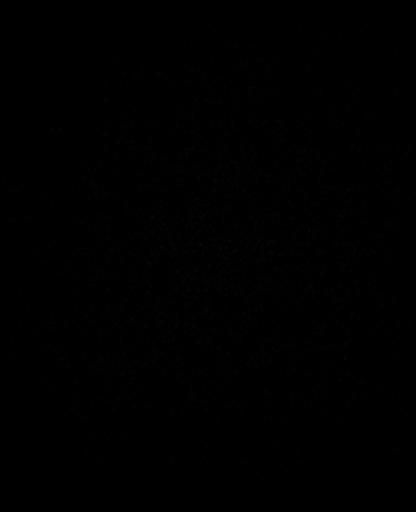

[24 of 48 positions shown; findings below may reference images not displayed]

FINDINGS: Brain: No acute infarction or intracranial hemorrhage. There is no
cerebellopontine angle mass. There is no abnormal enhancement within
the internal auditory canals. Inner ear structures demonstrate an
unremarkable MR appearance. There is no mass effect or edema. There
is no extra-axial fluid collection.

Patchy small foci of T2 hyperintensity in the supratentorial white
matter are nonspecific but may reflect mild chronic microvascular
ischemic changes.

Vascular: Major vessel flow voids at the skull base are preserved.

Skull and upper cervical spine: Normal marrow signal is preserved.

Sinuses/Orbits: Mild primarily sphenoid and ethmoid sinus mucosal
thickening. The orbits are unremarkable on this nondedicated exam.

Other: Incidental note is made of a partially empty sella. Minimal
right mastoid fluid opacification.
IMPRESSION: No findings to account for reported symptoms.

Mild chronic microvascular ischemic changes.

## 2021-09-06 ENCOUNTER — Ambulatory Visit: Payer: Medicare Other | Admitting: Physician Assistant

## 2021-09-12 ENCOUNTER — Ambulatory Visit: Payer: Medicare Other | Admitting: Physician Assistant

## 2021-09-12 VITALS — BP 159/81 | HR 80 | Ht 61.0 in | Wt 175.0 lb

## 2021-09-12 DIAGNOSIS — R32 Unspecified urinary incontinence: Secondary | ICD-10-CM

## 2021-09-12 DIAGNOSIS — R350 Frequency of micturition: Secondary | ICD-10-CM | POA: Diagnosis not present

## 2021-09-12 LAB — URINALYSIS, ROUTINE W REFLEX MICROSCOPIC
Bilirubin, UA: NEGATIVE
Glucose, UA: NEGATIVE
Ketones, UA: NEGATIVE
Nitrite, UA: NEGATIVE
Protein,UA: NEGATIVE
RBC, UA: NEGATIVE
Specific Gravity, UA: 1.02 (ref 1.005–1.030)
Urobilinogen, Ur: 0.2 mg/dL (ref 0.2–1.0)
pH, UA: 5 (ref 5.0–7.5)

## 2021-09-12 LAB — BLADDER SCAN AMB NON-IMAGING: Scan Result: 89

## 2021-09-12 MED ORDER — GEMTESA 75 MG PO TABS: 75.0000 mg | ORAL_TABLET | Freq: Every day | ORAL | 0 refills | Status: DC

## 2021-09-12 NOTE — Progress Notes (Signed)
post void residual =61mL

## 2021-09-12 NOTE — Progress Notes (Signed)
Assessment: 1. Urinary incontinence, unspecified type - Urinalysis, Routine w reflex microscopic - BLADDER SCAN AMB NON-IMAGING  2. Urinary frequency    Plan: Will remain off Myrbetriq and trial of Gemtesa-samples given. Discussed voiding habits and pt given written and verbal OAB information.  Discussed types of incontinence including urge and stress and potential for ordering urodynamics in the future if she has no relief with the Gemtesa.  Follow-up in 6 weeks for UA and PVR.  Chief Complaint: No chief complaint on file.   HPI: [name] is a 76 y.o. female who presents for continued evaluation of urinary frequency and urgency with incontinence.  She resumed Myrbetriq which was helpful in the past and her last visit and states there is been no change in her morning time urgency severity which often results in incontinence before she can get to the restroom.  She ran out of samples and has not had the medication over the past week.  She has had a decrease in frequency overall. No hematuria, dysuria, burning.  She reports no issues with stress type incontinence.  Patient patient's questions of whether "mesh" under her bladder would be helpful.  08/10/21 [name] is a 75yo here for evaluation of urinary incontinence. She was having issues with nocturia 5 years ago and was started on mirabegron which improved the nocturia. She stopped the medication due to cost. She now has daytime urinary frequency and urgency. She uses 3 pads during the day and 3 during the night.    Here records from [facility] are as follows: I get up too often at night to urinate. HPI: [name] is a 76 year-old female established patient who is here for urinating too frequently at night.??She first noticed the symptom approximately 11/09/2016. She usually gets up at night to urinate 2 times. She does not have nights when she does not get up to urinate at all. She does not have trouble falling back asleep once she has been woken  up at night. ??She does not usually have swelling in her hands and feet during the day. She does not take a diuretic. She does not have to strain or bear down to start her urinary stream. ??11/26/2017: Over the past year she has noted worsening nocturia with associated urgency, frequency and urge incontinence. ??12/24/2017: She has nocturia 2x now on mirabegron down from 4x. ??CC: I leak when I have the urge to urinate. HPI: She does have problems getting to the bathroom in time after she has the urge to urinate. She has had an accident when she couldn't get to the bathroom in time . She has 4 episodes of urge incontinence per day. Her urge incontinence began approximately 11/09/2016. Her symptoms have gotten worse over the last year. ??She does wear protective pads. She wears 1-2 pads per day. She generally urinates every hour in the daytime. She gets up at night to urinate 4 times. She is not having problems with emptying her bladder well. ??11/26/2017: Over the past year she has noted worsening urgency with urge incontinence. She has tried AZO which did not improve her urgency or urge incontinence. ??12/24/2017: She notes significant improvement in her urgency and urge incontinence with mirabegron 25mg   Portions of the above documentation were copied from a prior visit for review purposes only.  Allergies: Allergies  Allergen Reactions   Oxycodone Other (See Comments)    "makes me deathly sick"    PMH: Past Medical History:  Diagnosis Date   Bacterial  overgrowth syndrome    Positive HBT   Carotid artery occlusion    GERD (gastroesophageal reflux disease)    Heart murmur    mild AS 03/21/20 echo   Hypercholesteremia    Hypertension     PSH: Past Surgical History:  Procedure Laterality Date   ABDOMINAL HYSTERECTOMY     BACTERIAL OVERGROWTH TEST N/A 01/07/2013   Procedure: BACTERIAL OVERGROWTH TEST;  Surgeon: Corbin Ade, MD;  Location: [facility];  Service: Endoscopy;  Laterality: N/A;   7:30   Bladder Tack     x 2   COLONOSCOPY  July 2007   Dr. Lovell Sheehan: normal. Due for screening 2017   ENDARTERECTOMY Left 03/27/2020   Procedure: LEFT CAROTID ENDARTERECTOMY;  Surgeon: Sherren Kerns, MD;  Location: [facility] OR;  Service: Vascular;  Laterality: Left;   ESOPHAGOGASTRODUODENOSCOPY N/A 10/12/2012   RMR: Small inlet patch.  Small hiatal hernia.  Status post gastric biopsy, negative H.pylori   ESOPHAGOGASTRODUODENOSCOPY (EGD) WITH ESOPHAGEAL DILATION  Dec 2011   Dr. Jena Gauss: non-critical Schatzki's ring s/p dilation with 56 F dilator, small hiatal hernia, otherwise normal   FRACTURE SURGERY     left wrist   IR ANGIO EXTERNAL CAROTID SEL EXT CAROTID BILAT MOD SED  03/02/2019   IR ANGIO INTRA EXTRACRAN SEL INTERNAL CAROTID BILAT MOD SED  03/02/2019   IR ANGIO VERTEBRAL SEL VERTEBRAL BILAT MOD SED  03/02/2019   IR US GUIDE VASC ACCESS RIGHT  03/02/2019   KNEE ARTHROSCOPY     Bilateral (torn meniscus)   ROTATOR CUFF REPAIR     TUBAL LIGATION      SH: Social History   Tobacco Use   Smoking status: Never   Smokeless tobacco: Never  Vaping Use   Vaping Use: Never used  Substance Use Topics   Alcohol use: No   Drug use: No    ROS: All other review of systems were reviewed and are negative except what is noted above in HPI  PE: BP (!) 159/81   Pulse 80   Ht 5\' 1"  (1.549 m)   Wt 175 lb (79.4 kg)   BMI 33.07 kg/m  GENERAL APPEARANCE:  Well appearing, well developed, well nourished, NAD HEENT:  Atraumatic, normocephalic NECK:  Supple. Trachea midline ABDOMEN:  Soft, non-tender, no masses EXTREMITIES:  Moves all extremities well, without clubbing, cyanosis, or edema NEUROLOGIC:  Alert and oriented x 3, normal gait, CN II-XII grossly intact MENTAL STATUS:  appropriate BACK:  Non-tender to palpation, No CVAT SKIN:  Warm, dry, and intact   Results: Laboratory Data: Lab Results  Component Value Date   WBC 9.6 06/13/2021   HGB 14.6 06/13/2021   HCT 43.0 06/13/2021    MCV 90.2 06/13/2021   PLT 253 06/13/2021    Lab Results  Component Value Date   CREATININE 0.80 06/13/2021    Lab Results  Component Value Date   HGBA1C 5.4 06/14/2021    Urinalysis    Component Value Date/Time   COLORURINE STRAW (A) 06/13/2021 2142   APPEARANCEUR Clear 08/10/2021 1221   LABSPEC 1.006 06/13/2021 2142   PHURINE 5.0 06/13/2021 2142   GLUCOSEU Negative 08/10/2021 1221   HGBUR SMALL (A) 06/13/2021 2142   BILIRUBINUR Negative 08/10/2021 1221   KETONESUR NEGATIVE 06/13/2021 2142   PROTEINUR Negative 08/10/2021 1221   PROTEINUR NEGATIVE 06/13/2021 2142   NITRITE Negative 08/10/2021 1221   NITRITE NEGATIVE 06/13/2021 2142   LEUKOCYTESUR Trace (A) 08/10/2021 1221   LEUKOCYTESUR NEGATIVE 06/13/2021 2142    Lab  Results  Component Value Date   LABMICR See below: 08/10/2021   WBCUA 6-10 (A) 08/10/2021   LABEPIT 0-10 08/10/2021   BACTERIA Moderate (A) 08/10/2021    Pertinent Imaging: No results found for this or any previous visit.  No results found for this or any previous visit.  No results found for this or any previous visit.  No results found for this or any previous visit.  No results found for this or any previous visit.  No results found for this or any previous visit.  No results found for this or any previous visit.  No results found for this or any previous visit.  No results found for this or any previous visit (from the past 24 hour(s)).

## 2021-09-24 ENCOUNTER — Ambulatory Visit: Payer: Medicare Other | Admitting: Orthopedic Surgery

## 2021-10-03 ENCOUNTER — Ambulatory Visit: Payer: Medicare Other | Admitting: Internal Medicine

## 2021-10-08 ENCOUNTER — Ambulatory Visit: Payer: Medicare Other | Admitting: Orthopedic Surgery

## 2021-10-10 DIAGNOSIS — M19079 Primary osteoarthritis, unspecified ankle and foot: Secondary | ICD-10-CM | POA: Insufficient documentation

## 2021-10-10 DIAGNOSIS — M19072 Primary osteoarthritis, left ankle and foot: Secondary | ICD-10-CM | POA: Diagnosis not present

## 2021-10-10 DIAGNOSIS — M170 Bilateral primary osteoarthritis of knee: Secondary | ICD-10-CM | POA: Diagnosis not present

## 2021-10-22 ENCOUNTER — Ambulatory Visit (INDEPENDENT_AMBULATORY_CARE_PROVIDER_SITE_OTHER): Payer: Medicare Other | Admitting: Physician Assistant

## 2021-10-22 VITALS — BP 184/79 | HR 83 | Ht 61.0 in | Wt 175.0 lb

## 2021-10-22 DIAGNOSIS — R32 Unspecified urinary incontinence: Secondary | ICD-10-CM

## 2021-10-22 DIAGNOSIS — N3281 Overactive bladder: Secondary | ICD-10-CM | POA: Diagnosis not present

## 2021-10-22 LAB — BLADDER SCAN AMB NON-IMAGING: Scan Result: 63

## 2021-10-22 NOTE — Progress Notes (Signed)
Assessment: 1. Urinary incontinence, unspecified type - Urinalysis, Routine w reflex microscopic - BLADDER SCAN AMB NON-IMAGING  2. OAB (overactive bladder)    Plan: Patient will continue Gemtesa daily and feels she is doing so well that yearly follow-up is acceptable for her.  She will return sooner if she develops changes or has concerns.  If she is in need of samples going forward, she will call and will leave samples upfront for her.  Chief Complaint: No chief complaint on file.   HPI: [name] is a 76 y.o. female who presents for continued evaluation of OAB and incontinence.  She has been doing well on daily Gemtesa and would like to continue this.  She would like to continue samples as much as possible as the prescription is quite expensive.  She denies dysuria, hematuria, incomplete emptying.  UA is clear today.  PVR = 63 mL   09/12/21 [name] is a 76 y.o. female who presents for continued evaluation of urinary frequency and urgency with incontinence.  She resumed Myrbetriq which was helpful in the past and her last visit and states there is been no change in her morning time urgency severity which often results in incontinence before she can get to the restroom.  She ran out of samples and has not had the medication over the past week.  She has had a decrease in frequency overall. No hematuria, dysuria, burning.  She reports no issues with stress type incontinence.  Patient patient's questions of whether "mesh" under her bladder would be helpful.   08/10/21 [name] is a 75yo here for evaluation of urinary incontinence. She was having issues with nocturia 5 years ago and was started on mirabegron which improved the nocturia. She stopped the medication due to cost. She now has daytime urinary frequency and urgency. She uses 3 pads during the day and 3 during the night.    Here records from [facility] are as follows: I get up too often at night to urinate. HPI: [name] is a 76  year-old female established patient who is here for urinating too frequently at night.??She first noticed the symptom approximately 11/09/2016. She usually gets up at night to urinate 2 times. She does not have nights when she does not get up to urinate at all. She does not have trouble falling back asleep once she has been woken up at night. ??She does not usually have swelling in her hands and feet during the day. She does not take a diuretic. She does not have to strain or bear down to start her urinary stream. ??11/26/2017: Over the past year she has noted worsening nocturia with associated urgency, frequency and urge incontinence. ??12/24/2017: She has nocturia 2x now on mirabegron down from 4x. ??CC: I leak when I have the urge to urinate. HPI: She does have problems getting to the bathroom in time after she has the urge to urinate. She has had an accident when she couldn't get to the bathroom in time . She has 4 episodes of urge incontinence per day. Her urge incontinence began approximately 11/09/2016. Her symptoms have gotten worse over the last year. ??She does wear protective pads. She wears 1-2 pads per day. She generally urinates every hour in the daytime. She gets up at night to urinate 4 times. She is not having problems with emptying her bladder well. ??11/26/2017: Over the past year she has noted worsening urgency with urge incontinence. She has tried AZO which did not  improve her urgency or urge incontinence. ??12/24/2017: She notes significant improvement in her urgency and urge incontinence with mirabegron 25mg   Portions of the above documentation were copied from a prior visit for review purposes only.  Allergies: Allergies  Allergen Reactions   Oxycodone Other (See Comments)    "makes me deathly sick"    PMH: Past Medical History:  Diagnosis Date   Bacterial overgrowth syndrome    Positive HBT   Carotid artery occlusion    GERD (gastroesophageal reflux disease)    Heart murmur     mild AS 03/21/20 echo   Hypercholesteremia    Hypertension     PSH: Past Surgical History:  Procedure Laterality Date   ABDOMINAL HYSTERECTOMY     BACTERIAL OVERGROWTH TEST N/A 01/07/2013   Procedure: BACTERIAL OVERGROWTH TEST;  Surgeon: 01/09/2013, MD;  Location: [facility];  Service: Endoscopy;  Laterality: N/A;  7:30   Bladder Tack     x 2   COLONOSCOPY  July 2007   Dr. August 2007: normal. Due for screening 2017   ENDARTERECTOMY Left 03/27/2020   Procedure: LEFT CAROTID ENDARTERECTOMY;  Surgeon: 03/29/2020, MD;  Location: [facility] OR;  Service: Vascular;  Laterality: Left;   ESOPHAGOGASTRODUODENOSCOPY N/A 10/12/2012   RMR: Small inlet patch.  Small hiatal hernia.  Status post gastric biopsy, negative H.pylori   ESOPHAGOGASTRODUODENOSCOPY (EGD) WITH ESOPHAGEAL DILATION  Dec 2011   Dr. Jan 2012: non-critical Schatzki's ring s/p dilation with 56 F dilator, small hiatal hernia, otherwise normal   FRACTURE SURGERY     left wrist   IR ANGIO EXTERNAL CAROTID SEL EXT CAROTID BILAT MOD SED  03/02/2019   IR ANGIO INTRA EXTRACRAN SEL INTERNAL CAROTID BILAT MOD SED  03/02/2019   IR ANGIO VERTEBRAL SEL VERTEBRAL BILAT MOD SED  03/02/2019   IR 03/04/2019 GUIDE VASC ACCESS RIGHT  03/02/2019   KNEE ARTHROSCOPY     Bilateral (torn meniscus)   ROTATOR CUFF REPAIR     TUBAL LIGATION      SH: Social History   Tobacco Use   Smoking status: Never   Smokeless tobacco: Never  Vaping Use   Vaping Use: Never used  Substance Use Topics   Alcohol use: No   Drug use: No    ROS: All other review of systems were reviewed and are negative except what is noted above in HPI  PE: BP (!) 184/79   Pulse 83   Ht 5\' 1"  (1.549 m)   Wt 175 lb (79.4 kg)   BMI 33.07 kg/m  GENERAL APPEARANCE:  Well appearing, well developed, well nourished, NAD HEENT:  Atraumatic, normocephalic NECK:  Supple. Trachea midline ABDOMEN:  Soft, non-tender, no masses EXTREMITIES:  Moves all extremities well, without clubbing,  cyanosis, or edema NEUROLOGIC:  Alert and oriented x 3, normal gait, CN II-XII grossly intact MENTAL STATUS:  appropriate BACK:  Non-tender to palpation, No CVAT SKIN:  Warm, dry, and intact   Results: Laboratory Data: Lab Results  Component Value Date   WBC 9.6 06/13/2021   HGB 14.6 06/13/2021   HCT 43.0 06/13/2021   MCV 90.2 06/13/2021   PLT 253 06/13/2021    Lab Results  Component Value Date   CREATININE 0.80 06/13/2021    Lab Results  Component Value Date   HGBA1C 5.4 06/14/2021    Urinalysis    Component Value Date/Time   COLORURINE STRAW (A) 06/13/2021 2142   APPEARANCEUR Clear 09/12/2021 1556   LABSPEC 1.006 06/13/2021 2142   PHURINE 5.0  06/13/2021 2142   GLUCOSEU Negative 09/12/2021 1556   HGBUR SMALL (A) 06/13/2021 2142   BILIRUBINUR Negative 09/12/2021 1556   KETONESUR NEGATIVE 06/13/2021 2142   PROTEINUR Negative 09/12/2021 1556   PROTEINUR NEGATIVE 06/13/2021 2142   NITRITE Negative 09/12/2021 1556   NITRITE NEGATIVE 06/13/2021 2142   LEUKOCYTESUR Trace (A) 09/12/2021 1556   LEUKOCYTESUR NEGATIVE 06/13/2021 2142    Lab Results  Component Value Date   LABMICR Comment 09/12/2021   WBCUA 6-10 (A) 08/10/2021   LABEPIT 0-10 08/10/2021   BACTERIA Moderate (A) 08/10/2021    Pertinent Imaging: No results found for this or any previous visit.  No results found for this or any previous visit.  No results found for this or any previous visit.  No results found for this or any previous visit.  No results found for this or any previous visit.  No results found for this or any previous visit.  No results found for this or any previous visit.  No results found for this or any previous visit.  No results found for this or any previous visit (from the past 24 hour(s)).

## 2021-10-24 DIAGNOSIS — M19072 Primary osteoarthritis, left ankle and foot: Secondary | ICD-10-CM | POA: Diagnosis not present

## 2021-10-24 LAB — URINALYSIS, ROUTINE W REFLEX MICROSCOPIC
Bilirubin, UA: NEGATIVE
Glucose, UA: NEGATIVE
Ketones, UA: NEGATIVE
Nitrite, UA: NEGATIVE
Protein,UA: NEGATIVE
RBC, UA: NEGATIVE
Specific Gravity, UA: 1.02 (ref 1.005–1.030)
Urobilinogen, Ur: 0.2 mg/dL (ref 0.2–1.0)
pH, UA: 5.5 (ref 5.0–7.5)

## 2021-10-24 LAB — MICROSCOPIC EXAMINATION: RBC, Urine: NONE SEEN /hpf (ref 0–2)

## 2021-10-29 DIAGNOSIS — H04123 Dry eye syndrome of bilateral lacrimal glands: Secondary | ICD-10-CM | POA: Diagnosis not present

## 2021-10-29 DIAGNOSIS — H2513 Age-related nuclear cataract, bilateral: Secondary | ICD-10-CM | POA: Diagnosis not present

## 2021-11-26 DIAGNOSIS — Z8673 Personal history of transient ischemic attack (TIA), and cerebral infarction without residual deficits: Secondary | ICD-10-CM | POA: Diagnosis not present

## 2021-11-26 DIAGNOSIS — Z79899 Other long term (current) drug therapy: Secondary | ICD-10-CM | POA: Diagnosis not present

## 2021-11-26 DIAGNOSIS — E785 Hyperlipidemia, unspecified: Secondary | ICD-10-CM | POA: Diagnosis not present

## 2021-12-03 DIAGNOSIS — I10 Essential (primary) hypertension: Secondary | ICD-10-CM | POA: Diagnosis not present

## 2021-12-03 DIAGNOSIS — E785 Hyperlipidemia, unspecified: Secondary | ICD-10-CM | POA: Diagnosis not present

## 2021-12-07 DIAGNOSIS — H25812 Combined forms of age-related cataract, left eye: Secondary | ICD-10-CM | POA: Diagnosis not present

## 2021-12-10 ENCOUNTER — Ambulatory Visit (HOSPITAL_COMMUNITY): Payer: Medicare Other | Attending: Internal Medicine

## 2021-12-10 DIAGNOSIS — I350 Nonrheumatic aortic (valve) stenosis: Secondary | ICD-10-CM

## 2021-12-10 LAB — ECHOCARDIOGRAM COMPLETE
AR max vel: 1.43 cm2
AV Area VTI: 1.53 cm2
AV Area mean vel: 1.46 cm2
AV Mean grad: 8.7 mmHg
AV Peak grad: 15.4 mmHg
Ao pk vel: 1.96 m/s
Area-P 1/2: 4.49 cm2
S' Lateral: 2.6 cm

## 2021-12-17 ENCOUNTER — Encounter: Payer: Self-pay | Admitting: Internal Medicine

## 2021-12-17 ENCOUNTER — Ambulatory Visit: Payer: Medicare Other | Attending: Internal Medicine | Admitting: Internal Medicine

## 2021-12-17 VITALS — BP 128/78 | HR 89 | Ht 61.0 in | Wt 180.0 lb

## 2021-12-17 DIAGNOSIS — I10 Essential (primary) hypertension: Secondary | ICD-10-CM

## 2021-12-17 DIAGNOSIS — R011 Cardiac murmur, unspecified: Secondary | ICD-10-CM | POA: Diagnosis not present

## 2021-12-17 DIAGNOSIS — E785 Hyperlipidemia, unspecified: Secondary | ICD-10-CM

## 2021-12-17 DIAGNOSIS — I350 Nonrheumatic aortic (valve) stenosis: Secondary | ICD-10-CM

## 2021-12-17 DIAGNOSIS — G459 Transient cerebral ischemic attack, unspecified: Secondary | ICD-10-CM

## 2021-12-17 DIAGNOSIS — I6523 Occlusion and stenosis of bilateral carotid arteries: Secondary | ICD-10-CM

## 2021-12-17 NOTE — Addendum Note (Signed)
Addended by: Cherlynn Kaiser A on: 12/17/2021 10:51 PM   Modules accepted: Level of Service

## 2021-12-17 NOTE — Progress Notes (Addendum)
Cardiology Office Note:    Date:  12/17/2021  ID:  [name], DOB [birth year], MRN [id number]  PCP:  Asencion Noble, MD  Cardiologist:  Elouise Munroe, MD  Electrophysiologist:  None   Referring MD: Asencion Noble, MD   Chief Complaint/Reason for Referral: Follow up  History of Present Illness:    [name] is a 76 y.o. female with a history of L carotid stenosis and left pulsatile tinnitus, HTN, HLD who I initially met for preoperative cardiovascular evaluation prior to L carotid endarterectomy on Mar 27, 2020. She had no cardiopulmonary complaints. She is the caretaker for a women, helps with cooking and cleaning. Can work outside without having to stop, does get tired at the end. Was able to complete 4 METS. She is s/p L carotid endarterectomy.  Today: TIA in April '23. Feeling well since. No recurrence. TIA sx were blurry vision.   We reviewed echo in detail.   The patient denies chest pain, chest pressure, dyspnea at rest or with exertion, palpitations, PND, orthopnea, or leg swelling. Denies cough, fever, chills. Denies nausea, vomiting. Denies syncope or presyncope. Denies dizziness or lightheadedness. Denies snoring.  Past Medical History:  Diagnosis Date   Bacterial overgrowth syndrome    Positive HBT   Carotid artery occlusion    GERD (gastroesophageal reflux disease)    Heart murmur    mild AS 03/21/20 echo   Hypercholesteremia    Hypertension     Past Surgical History:  Procedure Laterality Date   ABDOMINAL HYSTERECTOMY     BACTERIAL OVERGROWTH TEST N/A 01/07/2013   Procedure: BACTERIAL OVERGROWTH TEST;  Surgeon: Daneil Dolin, MD;  [facility];  Service: Endoscopy;  Laterality: N/A;  7:30   Bladder Tack     x 2   COLONOSCOPY  July 2007   Dr. Arnoldo Morale: normal. Due for screening 2017   ENDARTERECTOMY Left 03/27/2020   Procedure: LEFT CAROTID ENDARTERECTOMY;  Surgeon: Elam Dutch, MD;  Location: [facility] OR;  Service: Vascular;  Laterality: Left;    ESOPHAGOGASTRODUODENOSCOPY N/A 10/12/2012   RMR: Small inlet patch.  Small hiatal hernia.  Status post gastric biopsy, negative H.pylori   ESOPHAGOGASTRODUODENOSCOPY (EGD) WITH ESOPHAGEAL DILATION  Dec 2011   Dr. Gala Romney: non-critical Schatzki's ring s/p dilation with 22 F dilator, small hiatal hernia, otherwise normal   FRACTURE SURGERY     left wrist   IR ANGIO EXTERNAL CAROTID SEL EXT CAROTID BILAT MOD SED  03/02/2019   IR ANGIO INTRA EXTRACRAN SEL INTERNAL CAROTID BILAT MOD SED  03/02/2019   IR ANGIO VERTEBRAL SEL VERTEBRAL BILAT MOD SED  03/02/2019   IR US GUIDE VASC ACCESS RIGHT  03/02/2019   KNEE ARTHROSCOPY     Bilateral (torn meniscus)   ROTATOR CUFF REPAIR     TUBAL LIGATION      Current Medications: Current Meds  Medication Sig   amLODipine (NORVASC) 5 MG tablet Take 1 tablet by mouth daily.   aspirin EC 81 MG tablet Take 81 mg by mouth daily.   atorvastatin (LIPITOR) 40 MG tablet Take 1 tablet (40 mg total) by mouth at bedtime.   B Complex-C (B-COMPLEX WITH VITAMIN C) tablet Take 1 tablet by mouth every Monday, Tuesday, Wednesday, Thursday, and Friday.   esomeprazole (NEXIUM) 20 MG capsule Take 20 mg by mouth daily as needed (acid reflux/indigestion.).   Vibegron (GEMTESA) 75 MG TABS Take 75 mg by mouth daily.     Allergies:   Oxycodone   Social History  Tobacco Use   Smoking status: Never   Smokeless tobacco: Never  Vaping Use   Vaping Use: Never used  Substance Use Topics   Alcohol use: No   Drug use: No     Family History: The patient's family history is negative for Colon cancer.  ROS:   Please see the history of present illness.    All other systems reviewed and are negative.  EKGs/Labs/Other Studies Reviewed:    The following studies were reviewed today:  EKG:  NSR, poor R wave progression, no significant change.   I have independently reviewed the images from: n/a  Recent Labs: 06/13/2021: ALT 21; BUN 18; Creatinine, Ser 0.80; Hemoglobin 14.6;  Platelets 253; Potassium 3.9; Sodium 139  Recent Lipid Panel    Component Value Date/Time   CHOL 175 06/14/2021 0340   TRIG 119 06/14/2021 0340   HDL 55 06/14/2021 0340   CHOLHDL 3.2 06/14/2021 0340   VLDL 24 06/14/2021 0340   LDLCALC 96 06/14/2021 0340    Physical Exam:    VS:  BP 128/78 (Patient Position: Sitting)   Pulse 89   Ht _0  (1.549 m)   Wt 180 lb (81.6 kg)   SpO2 97%   BMI 34.01 kg/m     Wt Readings from Last 5 Encounters:  12/17/21 180 lb (81.6 kg)  10/22/21 175 lb (79.4 kg)  09/12/21 175 lb (79.4 kg)  06/13/21 175 lb 0.7 oz (79.4 kg)  02/16/21 175 lb (79.4 kg)    Constitutional: No acute distress Eyes: sclera non-icteric, normal conjunctiva and lids ENMT: normal dentition, moist mucous membranes Cardiovascular: regular rhythm, normal rate, 2/6 early to mid peaking SEM. S1 and S2 normal. No jugular venous distention.  Respiratory: clear to auscultation bilaterally GI : normal bowel sounds, soft and nontender. No distention.   MSK: extremities warm, well perfused. No edema.  NEURO: grossly nonfocal exam, moves all extremities. PSYCH: alert and oriented x 3, normal mood and affect.   ASSESSMENT:    1. Nonrheumatic aortic valve stenosis   2. Systolic murmur   3. Essential hypertension   4. Hyperlipidemia, unspecified hyperlipidemia type   5. TIA (transient ischemic attack)   6. Bilateral carotid artery stenosis     PLAN:    Mild aortic valve stenosis Systolic murmur  - Echo completed showing mild aortic valve stenosis. Follow up echo in 1 year. Reassuring by exam.  - continue ASA and statin.  TIA Bilateral carotid artery disease, s/p L CEA and mild right plaque HDL - cont ASA and statin.   - LDL above goal of <70. LDL is 86. She tells me she may be on and additional HLD therapy (suspect zetia) for LDL treatment. I have asked her to write in on [facility] with this medication.   Essential hypertension - continue amlodipine 5 mg daily.   Total  time of encounter: 20 minutes total time of encounter, including 15 minutes spent in face-to-face patient care on the date of this encounter. This time includes coordination of care and counseling regarding above mentioned problem list. Remainder of non-face-to-face time involved reviewing chart documents/testing relevant to the patient encounter and documentation in the medical record. I have independently reviewed documentation from referring provider.   Cherlynn Kaiser, MD, [facility]    Medication Adjustments/Labs and Tests Ordered: Current medicines are reviewed at length with the patient today.  Concerns regarding medicines are outlined above.   Orders Placed This Encounter  Procedures   EKG 12-Lead  ECHOCARDIOGRAM COMPLETE      No orders of the defined types were placed in this encounter.    Patient Instructions  Medication Instructions:  No Changes In Medications at this time.  *If you need a refill on your cardiac medications before your next appointment, please call your pharmacy*  Lab Work: None Ordered At This Time.  If you have labs (blood work) drawn today and your tests are completely normal, you will receive your results only by: [place] (if you have MyChart) OR A paper copy in the mail If you have any lab test that is abnormal or we need to change your treatment, we will call you to review the results.  Testing/Procedures: Your physician has requested that you have an echocardiogram IN ONE YEAR. Echocardiography is a painless test that uses sound waves to create images of your heart. It provides your doctor with information about the size and shape of your heart and how well your heart's chambers and valves are working. You may receive an ultrasound enhancing agent through an IV if needed to better visualize your heart during the echo.This procedure takes approximately one hour. There are no restrictions for this procedure. This will  take place at the [address], [address].   Follow-Up: At [facility], you and your health needs are our priority.  As part of our continuing mission to provide you with exceptional heart care, we have created designated Provider Care Teams.  These Care Teams include your primary Cardiologist (physician) and Advanced Practice Providers (APPs -  Physician Assistants and Nurse Practitioners) who all work together to provide you with the care you need, when you need it.   Your next appointment:   1 year(s) RIGHT AFTER ECHO   The format for your next appointment:   In Person  Provider:   Elouise Munroe, MD

## 2021-12-17 NOTE — Addendum Note (Signed)
Addended by: Cherlynn Kaiser A on: 12/17/2021 10:52 PM   Modules accepted: Level of Service

## 2021-12-17 NOTE — Patient Instructions (Signed)
Medication Instructions:  No Changes In Medications at this time.  *If you need a refill on your cardiac medications before your next appointment, please call your pharmacy*  Lab Work: None Ordered At This Time.  If you have labs (blood work) drawn today and your tests are completely normal, you will receive your results only by: [place] (if you have MyChart) OR A paper copy in the mail If you have any lab test that is abnormal or we need to change your treatment, we will call you to review the results.  Testing/Procedures: Your physician has requested that you have an echocardiogram IN ONE YEAR. Echocardiography is a painless test that uses sound waves to create images of your heart. It provides your doctor with information about the size and shape of your heart and how well your heart's chambers and valves are working. You may receive an ultrasound enhancing agent through an IV if needed to better visualize your heart during the echo.This procedure takes approximately one hour. There are no restrictions for this procedure. This will take place at the [address].   Follow-Up: At [facility], you and your health needs are our priority.  As part of our continuing mission to provide you with exceptional heart care, we have created designated Provider Care Teams.  These Care Teams include your primary Cardiologist (physician) and Advanced Practice Providers (APPs -  Physician Assistants and Nurse Practitioners) who all work together to provide you with the care you need, when you need it.   Your next appointment:   1 year(s) RIGHT AFTER ECHO   The format for your next appointment:   In Person  Provider:   Elouise Munroe, MD

## 2021-12-26 DIAGNOSIS — H2511 Age-related nuclear cataract, right eye: Secondary | ICD-10-CM | POA: Diagnosis not present

## 2021-12-28 DIAGNOSIS — H25811 Combined forms of age-related cataract, right eye: Secondary | ICD-10-CM | POA: Diagnosis not present

## 2022-01-09 ENCOUNTER — Telehealth: Payer: Self-pay

## 2022-01-09 NOTE — Telephone Encounter (Signed)
Patient called advising that the samples of Gemtesa 75mg  were working great and wanted to know if she could have another month supply.

## 2022-01-14 DIAGNOSIS — Z23 Encounter for immunization: Secondary | ICD-10-CM | POA: Diagnosis not present

## 2022-03-14 ENCOUNTER — Telehealth: Payer: Self-pay

## 2022-03-14 NOTE — Telephone Encounter (Signed)
Patient called advising that she needed more samples of Gemtesa 75mg . Patient was advised medication would be up front for her to pick up today.

## 2022-04-05 DIAGNOSIS — E785 Hyperlipidemia, unspecified: Secondary | ICD-10-CM | POA: Diagnosis not present

## 2022-04-05 DIAGNOSIS — Z79899 Other long term (current) drug therapy: Secondary | ICD-10-CM | POA: Diagnosis not present

## 2022-04-05 DIAGNOSIS — I10 Essential (primary) hypertension: Secondary | ICD-10-CM | POA: Diagnosis not present

## 2022-04-11 DIAGNOSIS — I10 Essential (primary) hypertension: Secondary | ICD-10-CM | POA: Diagnosis not present

## 2022-04-11 DIAGNOSIS — K5900 Constipation, unspecified: Secondary | ICD-10-CM | POA: Diagnosis not present

## 2022-04-17 ENCOUNTER — Encounter: Payer: Self-pay | Admitting: Gastroenterology

## 2022-04-17 ENCOUNTER — Ambulatory Visit: Payer: Medicare Other | Admitting: Gastroenterology

## 2022-05-08 ENCOUNTER — Other Ambulatory Visit: Payer: Self-pay | Admitting: *Deleted

## 2022-05-08 DIAGNOSIS — I6523 Occlusion and stenosis of bilateral carotid arteries: Secondary | ICD-10-CM

## 2022-05-09 ENCOUNTER — Encounter: Payer: Self-pay | Admitting: Radiology

## 2022-05-16 DIAGNOSIS — M170 Bilateral primary osteoarthritis of knee: Secondary | ICD-10-CM | POA: Diagnosis not present

## 2022-05-17 ENCOUNTER — Ambulatory Visit: Payer: Medicare Other | Admitting: Vascular Surgery

## 2022-05-17 ENCOUNTER — Ambulatory Visit (HOSPITAL_COMMUNITY): Payer: Medicare Other

## 2022-05-20 ENCOUNTER — Ambulatory Visit: Payer: Medicare Other | Admitting: Gastroenterology

## 2022-05-23 ENCOUNTER — Telehealth: Payer: Self-pay | Admitting: *Deleted

## 2022-05-23 NOTE — Telephone Encounter (Signed)
   Pre-operative Risk Assessment    Patient Name: [name]  DOB: 1945/05/14 MRN: [id number]      Request for Surgical Clearance    Procedure:   RIGHT TKA  Date of Surgery:  Clearance TBD                                 Surgeon:  DR. Esmond Plants Surgeon's Group or Practice Name:  [facility] Phone number:  [phone] ATTN: [place] Fax number:  [phone]   Type of Clearance Requested:   - Medical ; ASA    Type of Anesthesia:  General  & SPINAL ARE BOTH LISTED ON CLEARANCE FORM   Additional requests/questions:    Jiles Prows   05/23/2022, 11:25 AM

## 2022-05-23 NOTE — Telephone Encounter (Signed)
   Name: [name]  DOB: 1945-08-26  MRN: [id number]  Primary Cardiologist: Elouise Munroe, MD   Preoperative team, please contact this patient and set up a phone call appointment for further preoperative risk assessment. Please obtain consent and complete medication review. Thank you for your help.  I confirm that guidance regarding antiplatelet and oral anticoagulation therapy has been completed and, if necessary, noted below.  Patient takes aspirin for history of carotid artery stenosis, TIA.  Therefore, recommendations for holding aspirin prior to surgery should come from managing provider (vascular surgery, PCP/neurology).   [name], NP 05/23/2022, 12:12 PM [place]

## 2022-05-23 NOTE — Telephone Encounter (Signed)
Pt has been scheduled for tele pre op appt 05/27/22 @ 2 pm. Med rec and consent are done.

## 2022-05-23 NOTE — Telephone Encounter (Signed)
Pt has been scheduled for tele pre op appt 05/27/22 @ 2 pm. Med rec and consent are done.     Patient Consent for Virtual Visit        [name] has provided verbal consent on 05/23/2022 for a virtual visit (video or telephone).   CONSENT FOR VIRTUAL VISIT FOR:  [name]  By participating in this virtual visit I agree to the following:  I hereby voluntarily request, consent and authorize [facility] and its employed or contracted physicians, physician assistants, nurse practitioners or other licensed health care professionals (the Practitioner), to provide me with telemedicine health care services (the "Services") as deemed necessary by the treating Practitioner. I acknowledge and consent to receive the Services by the Practitioner via telemedicine. I understand that the telemedicine visit will involve communicating with the Practitioner through live audiovisual communication technology and the disclosure of certain medical information by electronic transmission. I acknowledge that I have been given the opportunity to request an in-person assessment or other available alternative prior to the telemedicine visit and am voluntarily participating in the telemedicine visit.  I understand that I have the right to withhold or withdraw my consent to the use of telemedicine in the course of my care at any time, without affecting my right to future care or treatment, and that the Practitioner or I may terminate the telemedicine visit at any time. I understand that I have the right to inspect all information obtained and/or recorded in the course of the telemedicine visit and may receive copies of available information for a reasonable fee.  I understand that some of the potential risks of receiving the Services via telemedicine include:  Delay or interruption in medical evaluation due to technological equipment failure or disruption; Information transmitted may not be sufficient (e.g. poor  resolution of images) to allow for appropriate medical decision making by the Practitioner; and/or  In rare instances, security protocols could fail, causing a breach of personal health information.  Furthermore, I acknowledge that it is my responsibility to provide information about my medical history, conditions and care that is complete and accurate to the best of my ability. I acknowledge that Practitioner's advice, recommendations, and/or decision may be based on factors not within their control, such as incomplete or inaccurate data provided by me or distortions of diagnostic images or specimens that may result from electronic transmissions. I understand that the practice of medicine is not an exact science and that Practitioner makes no warranties or guarantees regarding treatment outcomes. I acknowledge that a copy of this consent can be made available to me via my patient portal ([facility]), or I can request a printed copy by calling the office of [facility].    I understand that my insurance will be billed for this visit.   I have read or had this consent read to me. I understand the contents of this consent, which adequately explains the benefits and risks of the Services being provided via telemedicine.  I have been provided ample opportunity to ask questions regarding this consent and the Services and have had my questions answered to my satisfaction. I give my informed consent for the services to be provided through the use of telemedicine in my medical care

## 2022-05-27 ENCOUNTER — Ambulatory Visit: Payer: Medicare Other | Attending: Internal Medicine | Admitting: Student

## 2022-05-27 DIAGNOSIS — Z01810 Encounter for preprocedural cardiovascular examination: Secondary | ICD-10-CM

## 2022-05-27 NOTE — Progress Notes (Signed)
Virtual Visit via Telephone Note   Because of [name]'s co-morbid illnesses, she is at least at moderate risk for complications without adequate follow up.  This format is felt to be most appropriate for this patient at this time.  The patient did not have access to video technology/had technical difficulties with video requiring transitioning to audio format only (telephone).  All issues noted in this document were discussed and addressed.  No physical exam could be performed with this format.  Please refer to the patient's chart for her consent to telehealth for [facility].  Evaluation Performed:  Preoperative cardiovascular risk assessment _____________   Date:  05/27/2022   Patient ID:  [name], DOB [birth year], MRN [id number] Patient Location:  Home Provider location:   Office  Primary Care Provider:  Asencion Noble, MD Primary Cardiologist:  Elouise Munroe, MD  Chief Complaint / Patient Profile   77 y.o. y/o female with a h/o left carotid artery stenosis s/p left carotid endarterectomy 2022, hypertension, hyperlipidemia, TIA who is pending right TKA by Dr. Veverly Fells and presents today for telephonic preoperative cardiovascular risk assessment.  History of Present Illness    [name] is a 77 y.o. female who presents via audio/video conferencing for a telehealth visit today.  Pt was last seen in cardiology clinic on Dr. Margaretann Loveless by 12/17/2021.  At that time [name] was doing well.  The patient is now pending procedure as outlined above. Since her last visit, she is doing well from a cardiac standpoint. Patient denies shortness of breath or dyspnea on exertion. No chest pain, pressure, or tightness. Denies lower extremity edema, orthopnea, or PND. No palpitations. Exercise is limited secondary to knee pain, but she does stay active in her home. She is able to perform all moderate to heavy household tasks without difficulty.   Past Medical History    Past Medical  History:  Diagnosis Date   Bacterial overgrowth syndrome    Positive HBT   Carotid artery occlusion    GERD (gastroesophageal reflux disease)    Heart murmur    mild AS 03/21/20 echo   Hypercholesteremia    Hypertension    Past Surgical History:  Procedure Laterality Date   ABDOMINAL HYSTERECTOMY     BACTERIAL OVERGROWTH TEST N/A 01/07/2013   Procedure: BACTERIAL OVERGROWTH TEST;  Surgeon: Daneil Dolin, MD;  [facility];  Service: Endoscopy;  Laterality: N/A;  7:30   Bladder Tack     x 2   COLONOSCOPY  July 2007   Dr. Arnoldo Morale: normal. Due for screening 2017   ENDARTERECTOMY Left 03/27/2020   Procedure: LEFT CAROTID ENDARTERECTOMY;  Surgeon: Elam Dutch, MD;  Location: [facility] OR;  Service: Vascular;  Laterality: Left;   ESOPHAGOGASTRODUODENOSCOPY N/A 10/12/2012   RMR: Small inlet patch.  Small hiatal hernia.  Status post gastric biopsy, negative H.pylori   ESOPHAGOGASTRODUODENOSCOPY (EGD) WITH ESOPHAGEAL DILATION  Dec 2011   Dr. Gala Romney: non-critical Schatzki's ring s/p dilation with 36 F dilator, small hiatal hernia, otherwise normal   FRACTURE SURGERY     left wrist   IR ANGIO EXTERNAL CAROTID SEL EXT CAROTID BILAT MOD SED  03/02/2019   IR ANGIO INTRA EXTRACRAN SEL INTERNAL CAROTID BILAT MOD SED  03/02/2019   IR ANGIO VERTEBRAL SEL VERTEBRAL BILAT MOD SED  03/02/2019   IR US GUIDE VASC ACCESS RIGHT  03/02/2019   KNEE ARTHROSCOPY     Bilateral (torn meniscus)   ROTATOR CUFF REPAIR  TUBAL LIGATION      Allergies  Allergies  Allergen Reactions   Oxycodone Other (See Comments)    "makes me deathly sick"    Home Medications    Prior to Admission medications   Medication Sig Start Date End Date Taking? Authorizing Provider  amLODipine (NORVASC) 5 MG tablet Take 1 tablet by mouth daily. 04/03/15   [provider]  aspirin EC 81 MG tablet Take 81 mg by mouth daily.    [provider]  atorvastatin (LIPITOR) 40 MG tablet Take 1 tablet (40 mg  total) by mouth at bedtime. 06/14/21   Orson Eva, MD  B Complex-C (B-COMPLEX WITH VITAMIN C) tablet Take 1 tablet by mouth every Monday, Tuesday, Wednesday, Thursday, and Friday.    [provider]  esomeprazole (NEXIUM) 20 MG capsule Take 20 mg by mouth daily as needed (acid reflux/indigestion.).    [provider]  Vibegron (GEMTESA) 75 MG TABS Take 75 mg by mouth daily. 09/12/21   [place], Berneice Heinrich, PA-C    Physical Exam    Vital Signs:  [name] does not have vital signs available for review today.  Given telephonic nature of communication, physical exam is limited. AAOx3. NAD. Normal affect.  Speech and respirations are unlabored.  Accessory Clinical Findings    None  Assessment & Plan    Primary Cardiologist: Elouise Munroe, MD  Preoperative cardiovascular risk assessment.  Right TKA by Dr. Veverly Fells.  Chart reviewed as part of pre-operative protocol coverage. According to the RCRI, patient has a 0.9% risk of MACE. Patient reports activity equivalent to >4.0 METS (moderate and heavy household tasks).   Given past medical history and time since last visit, based on ACC/AHA guidelines, [name] would be at acceptable risk for the planned procedure without further cardiovascular testing.   Patient was advised that if she develops new symptoms prior to surgery to contact our office to arrange a follow-up appointment.  she verbalized understanding.  Ideally aspirin should be continued without interruption, however if the bleeding risk is too great, aspirin may be held for 7 days prior to surgery. Please resume aspirin post operatively when it is felt to be safe from a bleeding standpoint.    I will route this recommendation to the requesting party via Epic fax function.  Please call with questions.  Time:   Today, I have spent 6 minutes with the patient with telehealth technology discussing medical history, symptoms, and management plan.      Mayra Reel, NP  05/27/2022, 7:49 AM

## 2022-06-13 NOTE — Progress Notes (Signed)
Office Note    HPI: [name] is a 77 y.o. ([birth year]) female presenting at the in follow s/p L CEA with Dr. Darrick Penna on 03/27/20.  Since seen last year, [name] has been doing well overall. She noted she had a TIA last April, but has had no events since. Etiology unknown. She denies symptoms of claudication, rest pain, tissue loss of the feet.  The pt is  on a statin for cholesterol management.  The pt is  on a daily aspirin.   Other AC:  - The pt is  on medication for hypertension.   The pt is not diabetic.  Tobacco hx:  -  Past Medical History:  Diagnosis Date   Bacterial overgrowth syndrome    Positive HBT   Carotid artery occlusion    GERD (gastroesophageal reflux disease)    Heart murmur    mild AS 03/21/20 echo   Hypercholesteremia    Hypertension     Past Surgical History:  Procedure Laterality Date   ABDOMINAL HYSTERECTOMY     BACTERIAL OVERGROWTH TEST N/A 01/07/2013   Procedure: BACTERIAL OVERGROWTH TEST;  Surgeon: Corbin Ade, MD;  Location: [facility];  Service: Endoscopy;  Laterality: N/A;  7:30   Bladder Tack     x 2   COLONOSCOPY  July 2007   Dr. Lovell Sheehan: normal. Due for screening 2017   ENDARTERECTOMY Left 03/27/2020   Procedure: LEFT CAROTID ENDARTERECTOMY;  Surgeon: Sherren Kerns, MD;  Location: [facility] OR;  Service: Vascular;  Laterality: Left;   ESOPHAGOGASTRODUODENOSCOPY N/A 10/12/2012   RMR: Small inlet patch.  Small hiatal hernia.  Status post gastric biopsy, negative H.pylori   ESOPHAGOGASTRODUODENOSCOPY (EGD) WITH ESOPHAGEAL DILATION  Dec 2011   Dr. Jena Gauss: non-critical Schatzki's ring s/p dilation with 56 F dilator, small hiatal hernia, otherwise normal   FRACTURE SURGERY     left wrist   IR ANGIO EXTERNAL CAROTID SEL EXT CAROTID BILAT MOD SED  03/02/2019   IR ANGIO INTRA EXTRACRAN SEL INTERNAL CAROTID BILAT MOD SED  03/02/2019   IR ANGIO VERTEBRAL SEL VERTEBRAL BILAT MOD SED  03/02/2019   IR US GUIDE VASC ACCESS RIGHT  03/02/2019   KNEE  ARTHROSCOPY     Bilateral (torn meniscus)   ROTATOR CUFF REPAIR     TUBAL LIGATION      Social History   Socioeconomic History   Marital status: Divorced    Spouse name: Not on file   Number of children: Not on file   Years of education: Not on file   Highest education level: Not on file  Occupational History   Occupation: retired    Associate Professor: RETIRED    Comment: [employer]  Tobacco Use   Smoking status: Never   Smokeless tobacco: Never  Building services engineer Use: Never used  Substance and Sexual Activity   Alcohol use: No   Drug use: No   Sexual activity: Not on file  Other Topics Concern   Not on file  Social History Narrative   Not on file   Social Determinants of Health   Financial Resource Strain: Not on file  Food Insecurity: Not on file  Transportation Needs: Not on file  Physical Activity: Not on file  Stress: Not on file  Social Connections: Not on file  Intimate Partner Violence: Not on file    Family History  Problem Relation Age of Onset   Colon cancer Neg Hx     Current Outpatient Medications  Medication Sig  Dispense Refill   amLODipine (NORVASC) 5 MG tablet Take 1 tablet by mouth daily.  1   aspirin EC 81 MG tablet Take 81 mg by mouth daily.     atorvastatin (LIPITOR) 40 MG tablet Take 1 tablet (40 mg total) by mouth at bedtime. 30 tablet 1   B Complex-C (B-COMPLEX WITH VITAMIN C) tablet Take 1 tablet by mouth every Monday, Tuesday, Wednesday, Thursday, and Friday.     esomeprazole (NEXIUM) 20 MG capsule Take 20 mg by mouth daily as needed (acid reflux/indigestion.).     Vibegron (GEMTESA) 75 MG TABS Take 75 mg by mouth daily. 42 tablet 0   No current facility-administered medications for this visit.    Allergies  Allergen Reactions   Oxycodone Other (See Comments)    "makes me deathly sick"     REVIEW OF SYSTEMS:   [X]  denotes positive finding, [ ]  denotes negative finding Cardiac  Comments:  Chest pain or chest pressure:     Shortness of breath upon exertion:    Short of breath when lying flat:    Irregular heart rhythm:        Vascular    Pain in calf, thigh, or hip brought on by ambulation:    Pain in feet at night that wakes you up from your sleep:     Blood clot in your veins:    Leg swelling:         Pulmonary    Oxygen at home:    Productive cough:     Wheezing:         Neurologic    Sudden weakness in arms or legs:     Sudden numbness in arms or legs:     Sudden onset of difficulty speaking or slurred speech:    Temporary loss of vision in one eye:     Problems with dizziness:         Gastrointestinal    Blood in stool:     Vomited blood:         Genitourinary    Burning when urinating:     Blood in urine:        Psychiatric    Major depression:         Hematologic    Bleeding problems:    Problems with blood clotting too easily:        Skin    Rashes or ulcers:        Constitutional    Fever or chills:      PHYSICAL EXAMINATION:  There were no vitals filed for this visit.  General:  WDWN in NAD; vital signs documented above Gait: Not observed HENT: WNL, normocephalic Pulmonary: normal non-labored breathing , without wheezing Cardiac: regular HR,  Abdomen: soft, NT, no masses Skin: without rashes Vascular Exam/Pulses:  Right Left  Radial 2+ (normal) 2+ (normal)  Ulnar 2+ (normal) 2+ (normal)                   Extremities: without ischemic changes, without Gangrene , without cellulitis; without open wounds;  Musculoskeletal: no muscle wasting or atrophy  Neurologic: A&O X 3;  No focal weakness or paresthesias are detected Psychiatric:  The pt has Normal affect.   Non-Invasive Vascular Imaging:    Summary:  Right Carotid: Velocities in the right ICA are consistent with a 1-39%  stenosis.   Left Carotid: Velocities in the left ICA are consistent with a 1-39%  stenosis.  Patent CEA.   Vertebrals:  Bilateral vertebral arteries demonstrate  antegrade flow.  Subclavians: Normal flow hemodynamics were seen in bilateral subclavian               arteries.   *See table(s) above for measurements and observations.     ASSESSMENT/PLAN: [name] is a 77 y.o. female presenting status post left CEA with Dr. Darrick Penna On 03/27/2020.  Patient is doing well with no residual stenosis bilaterally.  I asked that she continue her current medication regimen.  We will see her again in 1 year for follow-up carotid duplex ultrasound  She was asked to seek immediate medical attention should symptoms of stroke, TIA, amaurosis present.  With her current level of stenosis, believe the risk of this is very low.   Victorino Sparrow, MD [facility] [phone]  Total time of patient care including pre-visit research, consultation, and documentation greater than 20 minutes

## 2022-06-14 ENCOUNTER — Ambulatory Visit: Payer: Medicare Other | Admitting: Vascular Surgery

## 2022-06-14 ENCOUNTER — Ambulatory Visit (HOSPITAL_COMMUNITY)
Admission: RE | Admit: 2022-06-14 | Discharge: 2022-06-14 | Disposition: A | Discharge status: Home / Self Care | Payer: Medicare Other | Source: Ambulatory Visit | Attending: Vascular Surgery | Admitting: Vascular Surgery

## 2022-06-14 ENCOUNTER — Encounter: Payer: Self-pay | Admitting: Vascular Surgery

## 2022-06-14 VITALS — BP 157/79 | HR 72 | Temp 97.6°F | Resp 20 | Ht 61.0 in | Wt 176.8 lb

## 2022-06-14 DIAGNOSIS — Z9889 Other specified postprocedural states: Secondary | ICD-10-CM

## 2022-06-14 DIAGNOSIS — I6523 Occlusion and stenosis of bilateral carotid arteries: Secondary | ICD-10-CM | POA: Diagnosis not present

## 2022-08-07 ENCOUNTER — Encounter (HOSPITAL_COMMUNITY): Payer: Self-pay

## 2022-08-25 DIAGNOSIS — Z6833 Body mass index (BMI) 33.0-33.9, adult: Secondary | ICD-10-CM | POA: Diagnosis not present

## 2022-08-25 DIAGNOSIS — R21 Rash and other nonspecific skin eruption: Secondary | ICD-10-CM | POA: Diagnosis not present

## 2022-08-25 DIAGNOSIS — L0390 Cellulitis, unspecified: Secondary | ICD-10-CM | POA: Diagnosis not present

## 2022-08-25 DIAGNOSIS — E669 Obesity, unspecified: Secondary | ICD-10-CM | POA: Diagnosis not present

## 2022-08-28 NOTE — H&P (Signed)
Patient's anticipated LOS is less than 2 midnights, meeting these requirements: - Younger than 39 - Lives within 1 hour of care - Has a competent adult at home to recover with post-op recover - NO history of  - Chronic pain requiring opiods  - Diabetes  - Coronary Artery Disease  - Heart failure  - Heart attack  - Stroke  - DVT/VTE  - Cardiac arrhythmia  - Respiratory Failure/COPD  - Renal failure  - Anemia  - Advanced Liver disease     [name] is an 77 y.o. female.    Chief Complaint: left knee pain   HPI: Pt is a 77 y.o. female complaining of left knee pain for multiple years. Pain had continually increased since the beginning. X-rays in the clinic show end-stage arthritic changes of the left knee. Pt has tried various conservative treatments which have failed to alleviate their symptoms, including injections and therapy. Various options are discussed with the patient. Risks, benefits and expectations were discussed with the patient. Patient understand the risks, benefits and expectations and wishes to proceed with surgery.   PCP:  Carylon Perches, MD  D/C Plans: Home  PMH: Past Medical History:  Diagnosis Date   Bacterial overgrowth syndrome    Positive HBT   Carotid artery occlusion    GERD (gastroesophageal reflux disease)    Heart murmur    mild AS 03/21/20 echo   Hypercholesteremia    Hypertension     PSH: Past Surgical History:  Procedure Laterality Date   ABDOMINAL HYSTERECTOMY     BACTERIAL OVERGROWTH TEST N/A 01/07/2013   Procedure: BACTERIAL OVERGROWTH TEST;  Surgeon: Corbin Ade, MD;  Location: [facility];  Service: Endoscopy;  Laterality: N/A;  7:30   Bladder Tack     x 2   COLONOSCOPY  July 2007   Dr. Lovell Sheehan: normal. Due for screening 2017   ENDARTERECTOMY Left 03/27/2020   Procedure: LEFT CAROTID ENDARTERECTOMY;  Surgeon: Sherren Kerns, MD;  Location: [facility] OR;  Service: Vascular;  Laterality: Left;   ESOPHAGOGASTRODUODENOSCOPY N/A  10/12/2012   RMR: Small inlet patch.  Small hiatal hernia.  Status post gastric biopsy, negative H.pylori   ESOPHAGOGASTRODUODENOSCOPY (EGD) WITH ESOPHAGEAL DILATION  Dec 2011   Dr. Jena Gauss: non-critical Schatzki's ring s/p dilation with 56 F dilator, small hiatal hernia, otherwise normal   FRACTURE SURGERY     left wrist   IR ANGIO EXTERNAL CAROTID SEL EXT CAROTID BILAT MOD SED  03/02/2019   IR ANGIO INTRA EXTRACRAN SEL INTERNAL CAROTID BILAT MOD SED  03/02/2019   IR ANGIO VERTEBRAL SEL VERTEBRAL BILAT MOD SED  03/02/2019   IR US GUIDE VASC ACCESS RIGHT  03/02/2019   KNEE ARTHROSCOPY     Bilateral (torn meniscus)   ROTATOR CUFF REPAIR     TUBAL LIGATION      Social History:  reports that she has never smoked. She has never used smokeless tobacco. She reports that she does not drink alcohol and does not use drugs. BMI: Estimated body mass index is 33.41 kg/m as calculated from the following:   Height as of 06/14/22: 5\' 1"  (1.549 m).   Weight as of 06/14/22: 80.2 kg.  Lab Results  Component Value Date   ALBUMIN 4.0 06/13/2021   Diabetes: Patient does not have a diagnosis of diabetes.     Smoking Status:   reports that she has never smoked. She has never used smokeless tobacco.    Allergies:  Allergies  Allergen Reactions  Oxycodone Other (See Comments)    "makes me deathly sick"    Medications: No current facility-administered medications for this encounter.   Current Outpatient Medications  Medication Sig Dispense Refill   amLODipine (NORVASC) 5 MG tablet Take 1 tablet by mouth daily.  1   aspirin EC 81 MG tablet Take 81 mg by mouth daily.     atorvastatin (LIPITOR) 40 MG tablet Take 1 tablet (40 mg total) by mouth at bedtime. 30 tablet 1   B Complex-C (B-COMPLEX WITH VITAMIN C) tablet Take 1 tablet by mouth every Monday, Tuesday, Wednesday, Thursday, and Friday.     esomeprazole (NEXIUM) 20 MG capsule Take 20 mg by mouth daily as needed (acid reflux/indigestion.).      Vibegron (GEMTESA) 75 MG TABS Take 75 mg by mouth daily. 42 tablet 0    No results found for this or any previous visit (from the past 48 hour(s)). No results found.  ROS: Pain with rom of the left lower extremity  Physical Exam: Alert and oriented 77 y.o. female in no acute distress Cranial nerves 2-12 intact Cervical spine: full rom with no tenderness, nv intact distally Chest: active breath sounds bilaterally, no wheeze rhonchi or rales Heart: regular rate and rhythm, no murmur Abd: non tender non distended with active bowel sounds Hip is stable with rom  Left knee painful rom with crepitus Nv intact distally Antalgic gait  Assessment/Plan Assessment: left knee end stage osteoarthritis  Plan:  Patient will undergo a left total knee by Dr. [name] at [place] Risks benefits and expectations were discussed with the patient. Patient understand risks, benefits and expectations and wishes to proceed. Preoperative templating of the joint replacement has been completed, documented, and submitted to the Operating Room personnel in order to optimize intra-operative equipment management.   Alphonsa Overall PA-C, MPAS [facility] is now [employer] [address], [place], [place] [address] Phone: [phone] [email or web address] Facebook  [employer]

## 2022-09-06 NOTE — Progress Notes (Signed)
COVID Vaccine Completed:  Date of COVID positive in last 90 days:  PCP - Carylon Perches, MD Cardiologist - Weston Brass, MD  Cardiac clearance in Epic dated 05-19-22 by Simona Huh, NP   Chest x-ray -  EKG - 12-17-21 Epic Stress Test -  ECHO - 12-10-21 Epic Cardiac Cath -  Pacemaker/ICD device last checked: Spinal Cord Stimulator:  Bowel Prep -   Sleep Study -  CPAP -   Fasting Blood Sugar -  Checks Blood Sugar _____ times a day  Last dose of GLP1 agonist-  N/A GLP1 instructions:  N/A   Last dose of SGLT-2 inhibitors-  N/A SGLT-2 instructions: N/A   Blood Thinner Instructions:  Time Aspirin Instructions:  ASA 81, ok to hold x7 days Last Dose:  Activity level:  Can go up a flight of stairs and perform activities of daily living without stopping and without symptoms of chest pain or shortness of breath.  Able to exercise without symptoms  Unable to go up a flight of stairs without symptoms of     Anesthesia review:  Mild aortic stenosis, murmur, L carotid artery stenosis, HTN, hx of TIA  Patient denies shortness of breath, fever, cough and chest pain at PAT appointment  Patient verbalized understanding of instructions that were given to them at the PAT appointment. Patient was also instructed that they will need to review over the PAT instructions again at home before surgery.

## 2022-09-06 NOTE — Patient Instructions (Addendum)
SURGICAL WAITING ROOM VISITATION Patients having surgery or a procedure may have no more than 2 support people in the waiting area - these visitors may rotate.    Children under the age of 79 must have an adult with them who is not the patient.  If the patient needs to stay at the hospital during part of their recovery, the visitor guidelines for inpatient rooms apply. Pre-op nurse will coordinate an appropriate time for 1 support person to accompany patient in pre-op.  This support person may not rotate.    Please refer to the [facility] website for the visitor guidelines for Inpatients (after your surgery is over and you are in a regular room).       Your procedure is scheduled on: 09-20-22   Report to [facility] Main Entrance    Report to admitting at 5:15 AM   Call this number if you have problems the morning of surgery [phone]   Do not eat food :After Midnight.   After Midnight you may have the following liquids until 4:30 AM DAY OF SURGERY  Water Non-Citrus Juices (without pulp, NO RED-Apple, White grape, White cranberry) Black Coffee (NO MILK/CREAM OR CREAMERS, sugar ok)  Clear Tea (NO MILK/CREAM OR CREAMERS, sugar ok) regular and decaf                             Plain Jell-O (NO RED)                                           Fruit ices (not with fruit pulp, NO RED)                                     Popsicles (NO RED)                                                               Sports drinks like Gatorade (NO RED)                   The day of surgery:  Drink ONE (1) Pre-Surgery Clear Ensure at 4:30 AM the morning of surgery. Drink in one sitting. Do not sip.  This drink was given to you during your hospital  pre-op appointment visit. Nothing else to drink after completing the Pre-Surgery Clear Ensure.          If you have questions, please contact your surgeon's office.   FOLLOW  ANY ADDITIONAL PRE OP INSTRUCTIONS YOU RECEIVED FROM YOUR SURGEON'S  OFFICE!!!     Oral Hygiene is also important to reduce your risk of infection.                                    Remember - BRUSH YOUR TEETH THE MORNING OF SURGERY WITH YOUR REGULAR TOOTHPASTE   Do NOT smoke after Midnight   Take these medicines the morning of surgery with A SIP OF WATER:   Amlodipine  Nexium  Ezetimibe  Vibegron  Tylenol if needed  Bring CPAP mask and tubing day of surgery.                              You may not have any metal on your body including hair pins, jewelry, and body piercing             Do not wear make-up, lotions, powders, perfumes or deodorant  Do not wear nail polish including gel and S&S, artificial/acrylic nails, or any other type of covering on natural nails including finger and toenails. If you have artificial nails, gel coating, etc. that needs to be removed by a nail salon please have this removed prior to surgery or surgery may need to be canceled/ delayed if the surgeon/ anesthesia feels like they are unable to be safely monitored.   Do not shave  48 hours prior to surgery.         Do not bring valuables to the hospital. [facility] IS NOT RESPONSIBLE   FOR VALUABLES.   Contacts, dentures or bridgework may not be worn into surgery.   DO NOT BRING YOUR HOME MEDICATIONS TO THE HOSPITAL. PHARMACY WILL DISPENSE MEDICATIONS LISTED ON YOUR MEDICATION LIST TO YOU DURING YOUR ADMISSION IN THE HOSPITAL!    Patients discharged on the day of surgery will not be allowed to drive home.  Someone NEEDS to stay with you for the first 24 hours after anesthesia.   Special Instructions: Bring a copy of your healthcare power of attorney and living will documents the day of surgery if you haven't scanned them before.              Please read over the following fact sheets you were given: IF YOU HAVE QUESTIONS ABOUT YOUR PRE-OP INSTRUCTIONS PLEASE CALL [phone] Gwen  If you received a COVID test during your pre-op visit  it is requested that you wear a  mask when out in public, stay away from anyone that may not be feeling well and notify your surgeon if you develop symptoms. If you test positive for Covid or have been in contact with anyone that has tested positive in the last 10 days please notify you surgeon.    Pre-operative 5 CHG Bath Instructions   You can play a key role in reducing the risk of infection after surgery. Your skin needs to be as free of germs as possible. You can reduce the number of germs on your skin by washing with CHG (chlorhexidine gluconate) soap before surgery. CHG is an antiseptic soap that kills germs and continues to kill germs even after washing.   DO NOT use if you have an allergy to chlorhexidine/CHG or antibacterial soaps. If your skin becomes reddened or irritated, stop using the CHG and notify one of our RNs at  [phone] .   Please shower with the CHG soap starting 4 days before surgery using the following schedule:     Please keep in mind the following:  DO NOT shave, including legs and underarms, starting the day of your first shower.   You may shave your face at any point before/day of surgery.  Place clean sheets on your bed the day you start using CHG soap. Use a clean washcloth (not used since being washed) for each shower. DO NOT sleep with pets once you start using the CHG.   CHG Shower Instructions:  If you choose to wash your hair and private  area, wash first with your normal shampoo/soap.  After you use shampoo/soap, rinse your hair and body thoroughly to remove shampoo/soap residue.  Turn the water OFF and apply about 3 tablespoons (45 ml) of CHG soap to a CLEAN washcloth.  Apply CHG soap ONLY FROM YOUR NECK DOWN TO YOUR TOES (washing for 3-5 minutes)  DO NOT use CHG soap on face, private areas, open wounds, or sores.  Pay special attention to the area where your surgery is being performed.  If you are having back surgery, having someone wash your back for you may be helpful. Wait 2  minutes after CHG soap is applied, then you may rinse off the CHG soap.  Pat dry with a clean towel  Put on clean clothes/pajamas   If you choose to wear lotion, please use ONLY the CHG-compatible lotions on the back of this paper.     Additional instructions for the day of surgery: DO NOT APPLY any lotions, deodorants, cologne, or perfumes.   Put on clean/comfortable clothes.  Brush your teeth.  Ask your nurse before applying any prescription medications to the skin.      CHG Compatible Lotions   Aveeno Moisturizing lotion  Cetaphil Moisturizing Cream  Cetaphil Moisturizing Lotion  Clairol Herbal Essence Moisturizing Lotion, Dry Skin  Clairol Herbal Essence Moisturizing Lotion, Extra Dry Skin  Clairol Herbal Essence Moisturizing Lotion, Normal Skin  Curel Age Defying Therapeutic Moisturizing Lotion with Alpha Hydroxy  Curel Extreme Care Body Lotion  Curel Soothing Hands Moisturizing Hand Lotion  Curel Therapeutic Moisturizing Cream, Fragrance-Free  Curel Therapeutic Moisturizing Lotion, Fragrance-Free  Curel Therapeutic Moisturizing Lotion, Original Formula  Eucerin Daily Replenishing Lotion  Eucerin Dry Skin Therapy Plus Alpha Hydroxy Crme  Eucerin Dry Skin Therapy Plus Alpha Hydroxy Lotion  Eucerin Original Crme  Eucerin Original Lotion  Eucerin Plus Crme Eucerin Plus Lotion  Eucerin TriLipid Replenishing Lotion  Keri Anti-Bacterial Hand Lotion  Keri Deep Conditioning Original Lotion Dry Skin Formula Softly Scented  Keri Deep Conditioning Original Lotion, Fragrance Free Sensitive Skin Formula  Keri Lotion Fast Absorbing Fragrance Free Sensitive Skin Formula  Keri Lotion Fast Absorbing Softly Scented Dry Skin Formula  Keri Original Lotion  Keri Skin Renewal Lotion Keri Silky Smooth Lotion  Keri Silky Smooth Sensitive Skin Lotion  Nivea Body Creamy Conditioning Oil  Nivea Body Extra Enriched Lotion  Nivea Body Original Lotion  Nivea Body Sheer Moisturizing Lotion  Nivea Crme  Nivea Skin Firming Lotion  NutraDerm 30 Skin Lotion  NutraDerm Skin Lotion  NutraDerm Therapeutic Skin Cream  NutraDerm Therapeutic Skin Lotion  ProShield Protective Hand Cream  Provon moisturizing lotion   PATIENT SIGNATURE_________________________________  NURSE SIGNATURE__________________________________  ________________________________________________________________________    Rogelia Mire  An incentive spirometer is a tool that can help keep your lungs clear and active. This tool measures how well you are filling your lungs with each breath. Taking long deep breaths may help reverse or decrease the chance of developing breathing (pulmonary) problems (especially infection) following: A long period of time when you are unable to move or be active. BEFORE THE PROCEDURE  If the spirometer includes an indicator to show your best effort, your nurse or respiratory therapist will set it to a desired goal. If possible, sit up straight or lean slightly forward. Try not to slouch. Hold the incentive spirometer in an upright position. INSTRUCTIONS FOR USE  Sit on the edge of your bed if possible, or sit up as far as you can in bed or on a chair.  Hold the incentive spirometer in an upright position. Breathe out normally. Place the mouthpiece in your mouth and seal your lips tightly around it. Breathe in slowly and as deeply as possible, raising the piston or the ball toward the top of the column. Hold your breath for 3-5 seconds or for as long as possible. Allow the piston or ball to fall to the bottom of the column. Remove the mouthpiece from your mouth and breathe out normally. Rest for a few seconds and repeat Steps 1 through 7 at least 10 times every 1-2 hours when you are awake. Take your time and take a few normal breaths between deep breaths. The spirometer may include an indicator to show your best effort. Use the indicator as a goal to work toward during each  repetition. After each set of 10 deep breaths, practice coughing to be sure your lungs are clear. If you have an incision (the cut made at the time of surgery), support your incision when coughing by placing a pillow or rolled up towels firmly against it. Once you are able to get out of bed, walk around indoors and cough well. You may stop using the incentive spirometer when instructed by your caregiver.  RISKS AND COMPLICATIONS Take your time so you do not get dizzy or light-headed. If you are in pain, you may need to take or ask for pain medication before doing incentive spirometry. It is harder to take a deep breath if you are having pain. AFTER USE Rest and breathe slowly and easily. It can be helpful to keep track of a log of your progress. Your caregiver can provide you with a simple table to help with this. If you are using the spirometer at home, follow these instructions: SEEK MEDICAL CARE IF:  You are having difficultly using the spirometer. You have trouble using the spirometer as often as instructed. Your pain medication is not giving enough relief while using the spirometer. You develop fever of 100.5 F (38.1 C) or higher. SEEK IMMEDIATE MEDICAL CARE IF:  You cough up bloody sputum that had not been present before. You develop fever of 102 F (38.9 C) or greater. You develop worsening pain at or near the incision site. MAKE SURE YOU:  Understand these instructions. Will watch your condition. Will get help right away if you are not doing well or get worse. Document Released: 07/08/2006 Document Revised: 05/20/2011 Document Reviewed: 09/08/2006 [facility] Patient Information 2014 [place], [place].   ________________________________________________________________________

## 2022-09-09 ENCOUNTER — Other Ambulatory Visit: Payer: Self-pay

## 2022-09-09 ENCOUNTER — Encounter (HOSPITAL_COMMUNITY): Payer: Self-pay

## 2022-09-09 ENCOUNTER — Encounter (HOSPITAL_COMMUNITY)
Admission: RE | Admit: 2022-09-09 | Discharge: 2022-09-09 | Disposition: A | Discharge status: Home / Self Care | Payer: Medicare Other | Source: Ambulatory Visit | Attending: Orthopedic Surgery | Admitting: Orthopedic Surgery

## 2022-09-09 VITALS — BP 165/79 | HR 86 | Temp 98.2°F | Resp 20 | Ht 61.0 in | Wt 178.2 lb

## 2022-09-09 DIAGNOSIS — Z01818 Encounter for other preprocedural examination: Secondary | ICD-10-CM | POA: Insufficient documentation

## 2022-09-09 DIAGNOSIS — I10 Essential (primary) hypertension: Secondary | ICD-10-CM | POA: Insufficient documentation

## 2022-09-09 HISTORY — DX: Transient cerebral ischemic attack, unspecified: G45.9

## 2022-09-09 HISTORY — DX: Unspecified osteoarthritis, unspecified site: M19.90

## 2022-09-09 LAB — BASIC METABOLIC PANEL
Anion gap: 10 (ref 5–15)
BUN: 15 mg/dL (ref 8–23)
CO2: 26 mmol/L (ref 22–32)
Calcium: 9.2 mg/dL (ref 8.9–10.3)
Chloride: 104 mmol/L (ref 98–111)
Creatinine, Ser: 0.77 mg/dL (ref 0.44–1.00)
GFR, Estimated: >60 mL/min (ref >60)
Glucose, Bld: 103 mg/dL — ABNORMAL HIGH (ref 70–99)
Potassium: 3.8 mmol/L (ref 3.5–5.1)
Sodium: 140 mmol/L (ref 135–145)

## 2022-09-09 LAB — CBC
HCT: 43.1 % (ref 36.0–46.0)
Hemoglobin: 14.3 g/dL (ref 12.0–15.0)
MCH: 31.3 pg (ref 26.0–34.0)
MCHC: 33.2 g/dL (ref 30.0–36.0)
MCV: 94.3 fL (ref 80.0–100.0)
Platelets: 283 10*3/uL (ref 150–400)
RBC: 4.57 MIL/uL (ref 3.87–5.11)
RDW: 11.9 % (ref 11.5–15.5)
WBC: 9.5 10*3/uL (ref 4.0–10.5)
nRBC: 0 % (ref 0.0–0.2)

## 2022-09-09 LAB — SURGICAL PCR SCREEN
MRSA, PCR: NEGATIVE
Staphylococcus aureus: NEGATIVE

## 2022-09-10 NOTE — Anesthesia Preprocedure Evaluation (Addendum)
Anesthesia Evaluation  Patient identified by MRN, date of birth, ID band Patient awake    Reviewed: Allergy & Precautions, NPO status , Patient's Chart, lab work & pertinent test results  Airway Mallampati: II  TM Distance: >3 FB Neck ROM: Full    Dental  (+) Teeth Intact, Dental Advisory Given   Pulmonary    breath sounds clear to auscultation       Cardiovascular hypertension, Pt. on medications + Valvular Problems/Murmurs AS  Rhythm:Regular Rate:Normal  Echo:  1. Left ventricular ejection fraction, by estimation, is 60 to 65%. The  left ventricle has normal function. The left ventricle has no regional  wall motion abnormalities. Left ventricular diastolic parameters were  normal.   2. Right ventricular systolic function is normal. The right ventricular  size is normal.   3. Left atrial size was mildly dilated.   4. The mitral valve is abnormal. Trivial mitral valve regurgitation. No  evidence of mitral stenosis.   5. Gradients are lower than those recorded on 06/11/21 mean14 peak 24.6  mmHg at that time. The aortic valve is tricuspid. There is moderate  calcification of the aortic valve. There is moderate thickening of the  aortic valve. Aortic valve regurgitation is  not visualized. Mild aortic valve stenosis.   6. The inferior vena cava is normal in size with greater than 50%  respiratory variability, suggesting right atrial pressure of 3 mmHg.      Neuro/Psych TIA negative psych ROS   GI/Hepatic Neg liver ROS,GERD  Medicated,,  Endo/Other  negative endocrine ROS    Renal/GU negative Renal ROS     Musculoskeletal  (+) Arthritis ,    Abdominal   Peds  Hematology negative hematology ROS (+)   Anesthesia Other Findings   Reproductive/Obstetrics                             Anesthesia Physical Anesthesia Plan  ASA: 3  Anesthesia Plan: Spinal   Post-op Pain Management: Regional  block*   Induction: Intravenous  PONV Risk Score and Plan: 3 and Ondansetron, Propofol infusion and Treatment may vary due to age or medical condition  Airway Management Planned: Natural Airway and Simple Face Mask  Additional Equipment: None  Intra-op Plan:   Post-operative Plan:   Informed Consent: I have reviewed the patients History and Physical, chart, labs and discussed the procedure including the risks, benefits and alternatives for the proposed anesthesia with the patient or authorized representative who has indicated his/her understanding and acceptance.       Plan Discussed with: CRNA  Anesthesia Plan Comments: (See PAT note 09/09/2022  - Pt delayed due to needing to use the bathroom x2 prior to the block and requesting a family discussion before selecting an anesthetic (spinal vs GA).   Lab Results      Component                Value               Date                      WBC                      9.5                 09/09/2022  HGB                      14.3                09/09/2022                HCT                      43.1                09/09/2022                MCV                      94.3                09/09/2022                PLT                      283                 09/09/2022           )       Anesthesia Quick Evaluation

## 2022-09-10 NOTE — Progress Notes (Signed)
Anesthesia Chart Review   Case: [id number] Date/Time: 09/20/22 0715   Procedure: TOTAL KNEE ARTHROPLASTY (Left: Knee) - general and spinal   Anesthesia type: General   Pre-op diagnosis: Left knee osteoarthritis   Location: [facility] ROOM 06 / [facility]   Surgeons: Beverely Low, MD       DISCUSSION:76 y.o. never smoker with h/o GERD, HTN, mild AS, s/p L CEA with Dr. Darrick Penna on 03/27/20, left knee OA scheduled for above procedure 09/20/2022 with Dr. Beverely Low.   s/p L CEA with Dr. Darrick Penna on 03/27/20, last seen by vascular 06/14/2022.  Stable at this visit with 1 year follow up recommended.   Per cardiology preoperative evaluation 05/27/2022, "Chart reviewed as part of pre-operative protocol coverage. According to the RCRI, patient has a 0.9% risk of MACE. Patient reports activity equivalent to >4.0 METS (moderate and heavy household tasks).    Given past medical history and time since last visit, based on ACC/AHA guidelines, [name] would be at acceptable risk for the planned procedure without further cardiovascular testing."  Anticipate pt can proceed with planned procedure barring acute status change.   VS: BP (!) 165/79   Pulse 86   Temp 36.8 C (Oral)   Resp 20   Ht 5\' 1"  (1.549 m)   Wt 80.8 kg   SpO2 98%   BMI 33.67 kg/m   PROVIDERS: Carylon Perches, MD is PCP   Cardiologist - Weston Brass, MD  LABS: Labs reviewed: Acceptable for surgery. (all labs ordered are listed, but only abnormal results are displayed)  Labs Reviewed  BASIC METABOLIC PANEL - Abnormal; Notable for the following components:      Result Value   Glucose, Bld 103 (*)    All other components within normal limits  SURGICAL PCR SCREEN  CBC     IMAGES:   EKG:   CV: Echo 12/10/2021  1. Left ventricular ejection fraction, by estimation, is 60 to 65%. The  left ventricle has normal function. The left ventricle has no regional  wall motion abnormalities. Left ventricular diastolic parameters were  normal.    2. Right ventricular systolic function is normal. The right ventricular  size is normal.   3. Left atrial size was mildly dilated.   4. The mitral valve is abnormal. Trivial mitral valve regurgitation. No  evidence of mitral stenosis.   5. Gradients are lower than those recorded on 06/11/21 mean14 peak 24.6  mmHg at that time. The aortic valve is tricuspid. There is moderate  calcification of the aortic valve. There is moderate thickening of the  aortic valve. Aortic valve regurgitation is  not visualized. Mild aortic valve stenosis.   6. The inferior vena cava is normal in size with greater than 50%  respiratory variability, suggesting right atrial pressure of 3 mmHg.  Past Medical History:  Diagnosis Date   Arthritis    Bacterial overgrowth syndrome    Positive HBT   Carotid artery occlusion    GERD (gastroesophageal reflux disease)    Heart murmur    mild AS 03/21/20 echo   Hypercholesteremia    Hypertension    TIA (transient ischemic attack)     Past Surgical History:  Procedure Laterality Date   ABDOMINAL HYSTERECTOMY     BACTERIAL OVERGROWTH TEST N/A 01/07/2013   Procedure: BACTERIAL OVERGROWTH TEST;  Surgeon: Corbin Ade, MD;  Location[facility];  Service: Endoscopy;  Laterality: N/A;  7:30   Bladder Tack     x 2  CATARACT EXTRACTION W/ INTRAOCULAR LENS IMPLANT Bilateral    COLONOSCOPY  09/2005   Dr. Lovell Sheehan: normal. Due for screening 2017   ENDARTERECTOMY Left 03/27/2020   Procedure: LEFT CAROTID ENDARTERECTOMY;  Surgeon: Sherren Kerns, MD;  Location: [facility] OR;  Service: Vascular;  Laterality: Left;   ESOPHAGOGASTRODUODENOSCOPY N/A 10/12/2012   RMR: Small inlet patch.  Small hiatal hernia.  Status post gastric biopsy, negative H.pylori   ESOPHAGOGASTRODUODENOSCOPY (EGD) WITH ESOPHAGEAL DILATION  02/2010   Dr. Jena Gauss: non-critical Schatzki's ring s/p dilation with 56 F dilator, small hiatal hernia, otherwise normal   FRACTURE SURGERY     left wrist   IR ANGIO  EXTERNAL CAROTID SEL EXT CAROTID BILAT MOD SED  03/02/2019   IR ANGIO INTRA EXTRACRAN SEL INTERNAL CAROTID BILAT MOD SED  03/02/2019   IR ANGIO VERTEBRAL SEL VERTEBRAL BILAT MOD SED  03/02/2019   IR US GUIDE VASC ACCESS RIGHT  03/02/2019   KNEE ARTHROSCOPY     Bilateral (torn meniscus)   ROTATOR CUFF REPAIR     TUBAL LIGATION      MEDICATIONS:  acetaminophen (TYLENOL) 650 MG CR tablet   amLODipine (NORVASC) 5 MG tablet   aspirin EC 81 MG tablet   atorvastatin (LIPITOR) 40 MG tablet   B Complex-C (B-COMPLEX WITH VITAMIN C) tablet   diphenhydramine-acetaminophen (PAIN RELIEVER PM) 25-500 MG TABS tablet   esomeprazole (NEXIUM) 20 MG capsule   ezetimibe (ZETIA) 10 MG tablet   Turmeric 500 MG TABS   TURMERIC PO   Vibegron (GEMTESA) 75 MG TABS   No current facility-administered medications for this encounter.    Jodell Cipro Ward, PA-C [facility] Pre-Surgical Testing [phone]

## 2022-09-20 ENCOUNTER — Other Ambulatory Visit: Payer: Self-pay

## 2022-09-20 ENCOUNTER — Ambulatory Visit (HOSPITAL_COMMUNITY): Payer: Medicare Other | Admitting: Physician Assistant

## 2022-09-20 ENCOUNTER — Encounter (HOSPITAL_COMMUNITY)
Admission: RE | Disposition: A | Discharge status: Home / Self Care | Payer: Self-pay | Source: Home / Self Care | Attending: Orthopedic Surgery

## 2022-09-20 ENCOUNTER — Observation Stay (HOSPITAL_COMMUNITY)
Admission: RE | Admit: 2022-09-20 | Discharge: 2022-09-22 | Disposition: A | Discharge status: Home / Self Care | Payer: Medicare Other | Attending: Orthopedic Surgery | Admitting: Orthopedic Surgery

## 2022-09-20 ENCOUNTER — Encounter (HOSPITAL_COMMUNITY): Payer: Self-pay | Admitting: Orthopedic Surgery

## 2022-09-20 ENCOUNTER — Ambulatory Visit (HOSPITAL_BASED_OUTPATIENT_CLINIC_OR_DEPARTMENT_OTHER): Payer: Medicare Other | Admitting: Anesthesiology

## 2022-09-20 DIAGNOSIS — M1712 Unilateral primary osteoarthritis, left knee: Secondary | ICD-10-CM

## 2022-09-20 DIAGNOSIS — E782 Mixed hyperlipidemia: Secondary | ICD-10-CM

## 2022-09-20 DIAGNOSIS — I340 Nonrheumatic mitral (valve) insufficiency: Secondary | ICD-10-CM | POA: Diagnosis not present

## 2022-09-20 DIAGNOSIS — Z79899 Other long term (current) drug therapy: Secondary | ICD-10-CM | POA: Diagnosis not present

## 2022-09-20 DIAGNOSIS — I10 Essential (primary) hypertension: Secondary | ICD-10-CM | POA: Insufficient documentation

## 2022-09-20 DIAGNOSIS — Z96652 Presence of left artificial knee joint: Principal | ICD-10-CM

## 2022-09-20 DIAGNOSIS — Z7982 Long term (current) use of aspirin: Secondary | ICD-10-CM | POA: Insufficient documentation

## 2022-09-20 DIAGNOSIS — Z8673 Personal history of transient ischemic attack (TIA), and cerebral infarction without residual deficits: Secondary | ICD-10-CM | POA: Diagnosis not present

## 2022-09-20 HISTORY — PX: TOTAL KNEE ARTHROPLASTY: SHX125

## 2022-09-20 SURGERY — ARTHROPLASTY, KNEE, TOTAL
Anesthesia: Spinal | Site: Knee | Laterality: Left

## 2022-09-20 MED ORDER — TRANEXAMIC ACID-NACL 1000-0.7 MG/100ML-% IV SOLN
1000.0000 mg | Freq: Once | INTRAVENOUS | Status: AC
  Administered 2022-09-20: 1000 mg via INTRAVENOUS
  Filled 2022-09-20: qty 100

## 2022-09-20 MED ORDER — PHENYLEPHRINE HCL-NACL 20-0.9 MG/250ML-% IV SOLN
INTRAVENOUS | Status: DC | PRN
  Administered 2022-09-20: 40 ug/min via INTRAVENOUS

## 2022-09-20 MED ORDER — DEXAMETHASONE SODIUM PHOSPHATE 10 MG/ML IJ SOLN
INTRAMUSCULAR | Status: DC | PRN
  Administered 2022-09-20: 8 mg via INTRAVENOUS

## 2022-09-20 MED ORDER — MIRABEGRON ER 25 MG PO TB24
25.0000 mg | ORAL_TABLET | Freq: Every day | ORAL | Status: DC
  Administered 2022-09-20 – 2022-09-22 (×3): 25 mg via ORAL
  Filled 2022-09-20 (×3): qty 1

## 2022-09-20 MED ORDER — LACTATED RINGERS IV SOLN: INTRAVENOUS | Status: DC

## 2022-09-20 MED ORDER — PHENOL 1.4 % MT LIQD: 1.0000 | OROMUCOSAL | Status: DC | PRN

## 2022-09-20 MED ORDER — 0.9 % SODIUM CHLORIDE (POUR BTL) OPTIME
TOPICAL | Status: DC | PRN
  Administered 2022-09-20: 1000 mL

## 2022-09-20 MED ORDER — DEXMEDETOMIDINE HCL IN NACL 80 MCG/20ML IV SOLN
INTRAVENOUS | Status: AC
  Filled 2022-09-20: qty 20

## 2022-09-20 MED ORDER — PANTOPRAZOLE SODIUM 40 MG PO TBEC
40.0000 mg | DELAYED_RELEASE_TABLET | Freq: Every day | ORAL | Status: DC
  Administered 2022-09-21 – 2022-09-22 (×2): 40 mg via ORAL
  Filled 2022-09-20 (×2): qty 1

## 2022-09-20 MED ORDER — DOCUSATE SODIUM 100 MG PO CAPS
100.0000 mg | ORAL_CAPSULE | Freq: Two times a day (BID) | ORAL | Status: DC
  Administered 2022-09-20 – 2022-09-22 (×5): 100 mg via ORAL
  Filled 2022-09-20 (×5): qty 1

## 2022-09-20 MED ORDER — ACETAMINOPHEN 325 MG PO TABS
325.0000 mg | ORAL_TABLET | Freq: Four times a day (QID) | ORAL | Status: DC | PRN
  Administered 2022-09-21 (×2): 650 mg via ORAL
  Filled 2022-09-20 (×2): qty 2

## 2022-09-20 MED ORDER — B COMPLEX-C PO TABS
1.0000 | ORAL_TABLET | Freq: Every day | ORAL | Status: DC
  Administered 2022-09-21 – 2022-09-22 (×2): 1 via ORAL
  Filled 2022-09-20 (×2): qty 1

## 2022-09-20 MED ORDER — BUPIVACAINE LIPOSOME 1.3 % IJ SUSP: 20.0000 mL | Freq: Once | INTRAMUSCULAR | Status: DC

## 2022-09-20 MED ORDER — METHOCARBAMOL 500 MG IVPB - SIMPLE MED: 500.0000 mg | Freq: Four times a day (QID) | INTRAVENOUS | Status: DC | PRN

## 2022-09-20 MED ORDER — TRAMADOL HCL 50 MG PO TABS: 50.0000 mg | ORAL_TABLET | Freq: Four times a day (QID) | ORAL | 0 refills | Status: DC | PRN

## 2022-09-20 MED ORDER — BUPIVACAINE-EPINEPHRINE 0.25% -1:200000 IJ SOLN
INTRAMUSCULAR | Status: AC
  Filled 2022-09-20: qty 1

## 2022-09-20 MED ORDER — PHENYLEPHRINE HCL-NACL 20-0.9 MG/250ML-% IV SOLN
INTRAVENOUS | Status: AC
  Filled 2022-09-20: qty 250

## 2022-09-20 MED ORDER — ACETAMINOPHEN ER 650 MG PO TBCR: 1300.0000 mg | EXTENDED_RELEASE_TABLET | Freq: Three times a day (TID) | ORAL | Status: DC | PRN

## 2022-09-20 MED ORDER — ONDANSETRON HCL 4 MG/2ML IJ SOLN
INTRAMUSCULAR | Status: DC | PRN
  Administered 2022-09-20: 4 mg via INTRAVENOUS

## 2022-09-20 MED ORDER — PROMETHAZINE HCL 25 MG/ML IJ SOLN: 6.2500 mg | INTRAMUSCULAR | Status: DC | PRN

## 2022-09-20 MED ORDER — METOCLOPRAMIDE HCL 5 MG/ML IJ SOLN: 5.0000 mg | Freq: Three times a day (TID) | INTRAMUSCULAR | Status: DC | PRN

## 2022-09-20 MED ORDER — DEXMEDETOMIDINE HCL IN NACL 80 MCG/20ML IV SOLN
INTRAVENOUS | Status: DC | PRN
  Administered 2022-09-20: 8 ug via INTRAVENOUS
  Administered 2022-09-20: 4 ug via INTRAVENOUS

## 2022-09-20 MED ORDER — SODIUM CHLORIDE 0.9 % IR SOLN
Status: DC | PRN
  Administered 2022-09-20: 1000 mL

## 2022-09-20 MED ORDER — SODIUM CHLORIDE (PF) 0.9 % IJ SOLN
INTRAMUSCULAR | Status: DC | PRN
  Administered 2022-09-20: 30 mL

## 2022-09-20 MED ORDER — METHOCARBAMOL 500 MG PO TABS: 500.0000 mg | ORAL_TABLET | Freq: Three times a day (TID) | ORAL | 1 refills | Status: DC | PRN

## 2022-09-20 MED ORDER — ASPIRIN EC 81 MG PO TBEC: 81.0000 mg | DELAYED_RELEASE_TABLET | Freq: Two times a day (BID) | ORAL | 0 refills | Status: AC

## 2022-09-20 MED ORDER — ACETAMINOPHEN 325 MG PO TABS: 325.0000 mg | ORAL_TABLET | Freq: Once | ORAL | Status: DC | PRN

## 2022-09-20 MED ORDER — ONDANSETRON HCL 4 MG/2ML IJ SOLN
INTRAMUSCULAR | Status: AC
  Filled 2022-09-20: qty 2

## 2022-09-20 MED ORDER — MIDAZOLAM HCL 5 MG/5ML IJ SOLN
INTRAMUSCULAR | Status: DC | PRN
  Administered 2022-09-20: 1 mg via INTRAVENOUS

## 2022-09-20 MED ORDER — VASOPRESSIN 20 UNIT/ML IV SOLN
INTRAVENOUS | Status: DC | PRN
  Administered 2022-09-20: 2 [IU] via INTRAVENOUS

## 2022-09-20 MED ORDER — CHLORHEXIDINE GLUCONATE 0.12 % MT SOLN
15.0000 mL | Freq: Once | OROMUCOSAL | Status: AC
  Administered 2022-09-20: 15 mL via OROMUCOSAL

## 2022-09-20 MED ORDER — DIPHENHYDRAMINE HCL 25 MG PO CAPS
25.0000 mg | ORAL_CAPSULE | Freq: Every day | ORAL | Status: DC
  Administered 2022-09-20 – 2022-09-21 (×2): 25 mg via ORAL
  Filled 2022-09-20 (×2): qty 1

## 2022-09-20 MED ORDER — SODIUM CHLORIDE (PF) 0.9 % IJ SOLN
INTRAMUSCULAR | Status: AC
  Filled 2022-09-20: qty 50

## 2022-09-20 MED ORDER — BUPIVACAINE LIPOSOME 1.3 % IJ SUSP
INTRAMUSCULAR | Status: AC
  Filled 2022-09-20: qty 20

## 2022-09-20 MED ORDER — ORAL CARE MOUTH RINSE: 15.0000 mL | Freq: Once | OROMUCOSAL | Status: AC

## 2022-09-20 MED ORDER — LIDOCAINE HCL (PF) 2 % IJ SOLN
INTRAMUSCULAR | Status: AC
  Filled 2022-09-20: qty 5

## 2022-09-20 MED ORDER — DEXAMETHASONE SODIUM PHOSPHATE 10 MG/ML IJ SOLN
INTRAMUSCULAR | Status: AC
  Filled 2022-09-20: qty 1

## 2022-09-20 MED ORDER — TRANEXAMIC ACID-NACL 1000-0.7 MG/100ML-% IV SOLN
1000.0000 mg | INTRAVENOUS | Status: AC
  Administered 2022-09-20: 1000 mg via INTRAVENOUS
  Filled 2022-09-20: qty 100

## 2022-09-20 MED ORDER — ACETAMINOPHEN 10 MG/ML IV SOLN: 1000.0000 mg | Freq: Once | INTRAVENOUS | Status: DC | PRN

## 2022-09-20 MED ORDER — CEFAZOLIN SODIUM-DEXTROSE 2-4 GM/100ML-% IV SOLN
2.0000 g | Freq: Four times a day (QID) | INTRAVENOUS | Status: AC
  Administered 2022-09-20 (×2): 2 g via INTRAVENOUS
  Filled 2022-09-20 (×2): qty 100

## 2022-09-20 MED ORDER — FENTANYL CITRATE (PF) 100 MCG/2ML IJ SOLN
INTRAMUSCULAR | Status: DC | PRN
  Administered 2022-09-20: 50 ug via INTRAVENOUS
  Administered 2022-09-20: 25 ug via INTRAVENOUS

## 2022-09-20 MED ORDER — ACETAMINOPHEN 160 MG/5ML PO SOLN: 325.0000 mg | Freq: Once | ORAL | Status: DC | PRN

## 2022-09-20 MED ORDER — CEFAZOLIN SODIUM-DEXTROSE 2-4 GM/100ML-% IV SOLN
2.0000 g | INTRAVENOUS | Status: AC
  Administered 2022-09-20: 2 g via INTRAVENOUS
  Filled 2022-09-20: qty 100

## 2022-09-20 MED ORDER — MENTHOL 3 MG MT LOZG: 1.0000 | LOZENGE | OROMUCOSAL | Status: DC | PRN

## 2022-09-20 MED ORDER — METOCLOPRAMIDE HCL 5 MG PO TABS: 5.0000 mg | ORAL_TABLET | Freq: Three times a day (TID) | ORAL | Status: DC | PRN

## 2022-09-20 MED ORDER — ATORVASTATIN CALCIUM 40 MG PO TABS
40.0000 mg | ORAL_TABLET | Freq: Every day | ORAL | Status: DC
  Administered 2022-09-20 – 2022-09-21 (×2): 40 mg via ORAL
  Filled 2022-09-20 (×2): qty 1

## 2022-09-20 MED ORDER — ACETAMINOPHEN 500 MG PO TABS
500.0000 mg | ORAL_TABLET | Freq: Every day | ORAL | Status: DC
  Administered 2022-09-20 – 2022-09-21 (×2): 500 mg via ORAL
  Filled 2022-09-20 (×2): qty 1

## 2022-09-20 MED ORDER — ASPIRIN 81 MG PO CHEW
81.0000 mg | CHEWABLE_TABLET | Freq: Two times a day (BID) | ORAL | Status: DC
  Administered 2022-09-20 – 2022-09-22 (×4): 81 mg via ORAL
  Filled 2022-09-20 (×4): qty 1

## 2022-09-20 MED ORDER — BUPIVACAINE LIPOSOME 1.3 % IJ SUSP
INTRAMUSCULAR | Status: DC | PRN
  Administered 2022-09-20: 20 mL

## 2022-09-20 MED ORDER — SODIUM CHLORIDE 0.9 % IV SOLN: INTRAVENOUS | Status: DC

## 2022-09-20 MED ORDER — ONDANSETRON HCL 4 MG PO TABS: 4.0000 mg | ORAL_TABLET | Freq: Four times a day (QID) | ORAL | Status: DC | PRN

## 2022-09-20 MED ORDER — EZETIMIBE 10 MG PO TABS
10.0000 mg | ORAL_TABLET | Freq: Every day | ORAL | Status: DC
  Administered 2022-09-21 – 2022-09-22 (×2): 10 mg via ORAL
  Filled 2022-09-20 (×2): qty 1

## 2022-09-20 MED ORDER — AMISULPRIDE (ANTIEMETIC) 5 MG/2ML IV SOLN: 10.0000 mg | Freq: Once | INTRAVENOUS | Status: DC | PRN

## 2022-09-20 MED ORDER — ONDANSETRON HCL 4 MG PO TABS: 4.0000 mg | ORAL_TABLET | Freq: Three times a day (TID) | ORAL | 1 refills | Status: DC | PRN

## 2022-09-20 MED ORDER — MIDAZOLAM HCL 2 MG/2ML IJ SOLN
INTRAMUSCULAR | Status: AC
  Filled 2022-09-20: qty 2

## 2022-09-20 MED ORDER — VASOPRESSIN 20 UNIT/ML IV SOLN
INTRAVENOUS | Status: AC
  Filled 2022-09-20: qty 1

## 2022-09-20 MED ORDER — HYDROMORPHONE HCL 1 MG/ML IJ SOLN: 0.2500 mg | INTRAMUSCULAR | Status: DC | PRN

## 2022-09-20 MED ORDER — METHOCARBAMOL 500 MG PO TABS
500.0000 mg | ORAL_TABLET | Freq: Four times a day (QID) | ORAL | Status: DC | PRN
  Administered 2022-09-20 – 2022-09-21 (×4): 500 mg via ORAL
  Filled 2022-09-20 (×4): qty 1

## 2022-09-20 MED ORDER — FENTANYL CITRATE (PF) 100 MCG/2ML IJ SOLN
INTRAMUSCULAR | Status: AC
  Filled 2022-09-20: qty 2

## 2022-09-20 MED ORDER — PROPOFOL 500 MG/50ML IV EMUL
INTRAVENOUS | Status: DC | PRN
  Administered 2022-09-20: 50 ug/kg/min via INTRAVENOUS

## 2022-09-20 MED ORDER — AMLODIPINE BESYLATE 5 MG PO TABS
5.0000 mg | ORAL_TABLET | Freq: Every day | ORAL | Status: DC
  Administered 2022-09-21 – 2022-09-22 (×2): 5 mg via ORAL
  Filled 2022-09-20 (×2): qty 1

## 2022-09-20 MED ORDER — ROPIVACAINE HCL 5 MG/ML IJ SOLN
INTRAMUSCULAR | Status: DC | PRN
  Administered 2022-09-20: 30 mL via PERINEURAL

## 2022-09-20 MED ORDER — WATER FOR IRRIGATION, STERILE IR SOLN
Status: DC | PRN
  Administered 2022-09-20: 2000 mL

## 2022-09-20 MED ORDER — ONDANSETRON HCL 4 MG/2ML IJ SOLN
4.0000 mg | Freq: Four times a day (QID) | INTRAMUSCULAR | Status: DC | PRN
  Administered 2022-09-21: 4 mg via INTRAVENOUS
  Filled 2022-09-20: qty 2

## 2022-09-20 MED ORDER — TURMERIC 500 MG PO TABS: 1000.0000 mg | ORAL_TABLET | Freq: Every day | ORAL | Status: DC

## 2022-09-20 MED ORDER — TURMERIC 500 MG PO CAPS: 1.0000 | ORAL_CAPSULE | Freq: Every day | ORAL | Status: DC

## 2022-09-20 MED ORDER — PROPOFOL 1000 MG/100ML IV EMUL
INTRAVENOUS | Status: AC
  Filled 2022-09-20: qty 100

## 2022-09-20 MED ORDER — DIPHENHYDRAMINE-APAP (SLEEP) 25-500 MG PO TABS: 1.0000 | ORAL_TABLET | Freq: Every day | ORAL | Status: DC

## 2022-09-20 MED ORDER — MORPHINE SULFATE (PF) 2 MG/ML IV SOLN
0.5000 mg | INTRAVENOUS | Status: DC | PRN
  Administered 2022-09-20 – 2022-09-21 (×5): 1 mg via INTRAVENOUS
  Filled 2022-09-20 (×5): qty 1

## 2022-09-20 MED ORDER — POVIDONE-IODINE 10 % EX SWAB: 2.0000 | Freq: Once | CUTANEOUS | Status: DC

## 2022-09-20 MED ORDER — LIDOCAINE HCL (CARDIAC) PF 100 MG/5ML IV SOSY
PREFILLED_SYRINGE | INTRAVENOUS | Status: DC | PRN
  Administered 2022-09-20: 40 mg via INTRAVENOUS

## 2022-09-20 MED ORDER — BUPIVACAINE-EPINEPHRINE 0.25% -1:200000 IJ SOLN
INTRAMUSCULAR | Status: DC | PRN
  Administered 2022-09-20: 30 mL

## 2022-09-20 MED ORDER — TRAMADOL HCL 50 MG PO TABS
50.0000 mg | ORAL_TABLET | Freq: Four times a day (QID) | ORAL | Status: DC | PRN
  Administered 2022-09-20 – 2022-09-21 (×5): 50 mg via ORAL
  Filled 2022-09-20 (×5): qty 1

## 2022-09-20 SURGICAL SUPPLY — 57 items
ATTUNE PSFEM LTSZ6 NARCEM KNEE (Femur) IMPLANT
ATTUNE PSRP INSR SZ6 7 KNEE (Insert) IMPLANT
BAG COUNTER SPONGE SURGICOUNT (BAG) IMPLANT
BAG SPEC THK2 15X12 ZIP CLS (MISCELLANEOUS)
BAG SPNG CNTER NS LX DISP (BAG) ×1
BAG ZIPLOCK 12X15 (MISCELLANEOUS) IMPLANT
BASE TIBIAL ROT PLAT SZ 5 KNEE (Knees) IMPLANT
BLADE SAG 18X100X1.27 (BLADE) ×1 IMPLANT
BLADE SAW SGTL 13X75X1.27 (BLADE) ×1 IMPLANT
BNDG CMPR MED 10X6 ELC LF (GAUZE/BANDAGES/DRESSINGS) ×1
BNDG ELASTIC 6X10 VLCR STRL LF (GAUZE/BANDAGES/DRESSINGS) ×1 IMPLANT
BNDG GAUZE DERMACEA FLUFF 4 (GAUZE/BANDAGES/DRESSINGS) ×1 IMPLANT
BNDG GZE DERMACEA 4 6PLY (GAUZE/BANDAGES/DRESSINGS) ×1
BOWL SMART MIX CTS (DISPOSABLE) ×1 IMPLANT
BSPLAT TIB 5 CMNT ROT PLAT STR (Knees) ×1 IMPLANT
CEMENT HV SMART SET (Cement) ×2 IMPLANT
CLSR STERI-STRIP ANTIMIC 1/2X4 (GAUZE/BANDAGES/DRESSINGS) IMPLANT
COVER SURGICAL LIGHT HANDLE (MISCELLANEOUS) ×1 IMPLANT
CUFF TOURN SGL QUICK 34 (TOURNIQUET CUFF) ×1
CUFF TRNQT CYL 34X4.125X (TOURNIQUET CUFF) ×1 IMPLANT
DRAPE INCISE IOBAN 66X45 STRL (DRAPES) IMPLANT
DRAPE SHEET LG 3/4 BI-LAMINATE (DRAPES) ×1 IMPLANT
DRAPE U-SHAPE 47X51 STRL (DRAPES) ×1 IMPLANT
DRSG ADAPTIC 3X8 NADH LF (GAUZE/BANDAGES/DRESSINGS) ×1 IMPLANT
DRSG EMULSION OIL 3X16 NADH (GAUZE/BANDAGES/DRESSINGS) IMPLANT
DURAPREP 26ML APPLICATOR (WOUND CARE) ×1 IMPLANT
ELECT REM PT RETURN 15FT ADLT (MISCELLANEOUS) ×1 IMPLANT
GAUZE PAD ABD 8X10 STRL (GAUZE/BANDAGES/DRESSINGS) ×1 IMPLANT
GAUZE SPONGE 4X4 12PLY STRL (GAUZE/BANDAGES/DRESSINGS) ×1 IMPLANT
GLOVE BIOGEL PI IND STRL 7.5 (GLOVE) ×1 IMPLANT
GLOVE BIOGEL PI IND STRL 8.5 (GLOVE) ×1 IMPLANT
GLOVE ORTHO TXT STRL SZ7.5 (GLOVE) ×1 IMPLANT
GLOVE SURG ORTHO 8.5 STRL (GLOVE) ×1 IMPLANT
GOWN STRL REUS W/ TWL XL LVL3 (GOWN DISPOSABLE) ×2 IMPLANT
GOWN STRL REUS W/TWL XL LVL3 (GOWN DISPOSABLE) ×2
HANDPIECE INTERPULSE COAX TIP (DISPOSABLE) ×1
HOLDER FOLEY CATH W/STRAP (MISCELLANEOUS) IMPLANT
IMMOBILIZER KNEE 20 (SOFTGOODS) ×1
IMMOBILIZER KNEE 20 THIGH 36 (SOFTGOODS) IMPLANT
KIT TURNOVER KIT A (KITS) IMPLANT
MANIFOLD NEPTUNE II (INSTRUMENTS) ×1 IMPLANT
NS IRRIG 1000ML POUR BTL (IV SOLUTION) ×1 IMPLANT
PACK TOTAL KNEE CUSTOM (KITS) ×1 IMPLANT
PATELLA MEDIAL ATTUN 35MM KNEE (Knees) IMPLANT
PIN STEINMAN FIXATION KNEE (PIN) IMPLANT
PROTECTOR NERVE ULNAR (MISCELLANEOUS) ×1 IMPLANT
SET HNDPC FAN SPRY TIP SCT (DISPOSABLE) ×1 IMPLANT
STRIP CLOSURE SKIN 1/2X4 (GAUZE/BANDAGES/DRESSINGS) ×2 IMPLANT
SUT MNCRL AB 3-0 PS2 18 (SUTURE) ×1 IMPLANT
SUT VIC AB 0 CT1 36 (SUTURE) ×1 IMPLANT
SUT VIC AB 1 CT1 36 (SUTURE) ×2 IMPLANT
SUT VIC AB 2-0 CT1 27 (SUTURE) ×1
SUT VIC AB 2-0 CT1 TAPERPNT 27 (SUTURE) ×1 IMPLANT
TIBIAL BASE ROT PLAT SZ 5 KNEE (Knees) ×1 IMPLANT
TRAY CATH INTERMITTENT SS 16FR (CATHETERS) ×1 IMPLANT
WATER STERILE IRR 1000ML POUR (IV SOLUTION) ×2 IMPLANT
YANKAUER SUCT BULB TIP NO VENT (SUCTIONS) ×1 IMPLANT

## 2022-09-20 NOTE — Transfer of Care (Signed)
Immediate Anesthesia Transfer of Care Note  Patient: [name]  Procedure(s) Performed: Procedure(s) (LRB): TOTAL KNEE ARTHROPLASTY (Left)  Patient Location: PACU  Anesthesia Type: Spinal  Level of Consciousness: awake, alert , oriented and patient cooperative  Airway & Oxygen Therapy: Patient Spontanous Breathing and Patient connected to face mask oxygen  Post-op Assessment: Report given to PACU RN and Post -op Vital signs reviewed and stable  Post vital signs: Reviewed and stable  Complications: No apparent anesthesia complications Last Vitals:  Vitals Value Taken Time  BP 118/48 09/20/22 0945  Temp 36.4 C 09/20/22 0938  Pulse 85 09/20/22 0947  Resp 25 09/20/22 0947  SpO2 100 % 09/20/22 0947  Vitals shown include unfiled device data.  Last Pain:  Vitals:   09/20/22 0938  TempSrc:   PainSc: 0-No pain      Patients Stated Pain Goal: 4 (09/20/22 0604)  Complications: No notable events documented.

## 2022-09-20 NOTE — Op Note (Signed)
[name], [name] MEDICAL RECORD NO: [id number] ACCOUNT NO: [id number] DATE OF BIRTH: 1945-06-06 FACILITY: [facility] LOCATION: [facility] PHYSICIAN: Almedia Balls. [name], MD  Operative Report   DATE OF PROCEDURE: 09/20/2022  PREOPERATIVE DIAGNOSIS:  Left knee end-stage arthritis.  POSTOPERATIVE DIAGNOSIS:  Left knee end-stage arthritis.  PROCEDURE PERFORMED:  Left total knee arthroplasty using DePuy Attune prosthesis.  ATTENDING SURGEON:  Almedia Balls. [name], MD  ASSISTANT:  Konrad Felix Dixon, [place], who was scrubbed during the entire procedure, and necessary for satisfactory completion of surgery.  ANESTHESIA:  Spinal anesthesia plus adductor canal block was utilized.  ESTIMATED BLOOD LOSS:  Minimal.  FLUID REPLACEMENT:  1500 mL crystalloid.  COUNTS:  Instrument counts correct.  COMPLICATIONS:  No complications.  ANTIBIOTICS:  Perioperative antibiotics were given.  TOURNIQUET TIME:  82 minutes at 300 mmHg.  INDICATIONS:  The patient is a 77 year old female with worsening left knee pain due to end-stage arthritis, bone-on-bone.  The patient has had progressive pain despite conservative management and presents for operative total knee arthroplasty in order to  restore function and to eliminate pain.  Informed consent obtained.  DESCRIPTION OF PROCEDURE:  After an adequate level of spinal anesthesia was achieved, plus an adductor canal block, the patient was positioned supine on the operating table.  Left leg correctly identified.  Nonsterile tourniquet placed on the proximal  thigh.  Sterile prep and drape performed.  Timeout called, verifying correct patient, correct site, we elevated the leg and exsanguinated using an Esmarch bandage, inflating the tourniquet to 300 mmHg.  We then placed the knee in flexion performed a  longitudinal midline incision with a 10 blade scalpel, a fresh 10 blade was utilized for the medial parapatellar arthrotomy.  We then divided lateral patellofemoral  ligaments everting the patella and exposing the distal femur, which was devoid of  cartilage.  We entered the distal femur with a step cut drill and then placed our intramedullary guide, resecting 10 mm off the distal femur set on 5 degrees valgus for this knee with a slight flexion contracture.  We then sized the femur to a size 6  anterior down performing anterior, posterior and chamfer cuts with the 4-in-1 block.  Next we removed ACL and PCL meniscal tissue subluxed the tibia anteriorly and then performing our tibial cut with the external jig, resecting 2 mm off the affected  medial side, 90 degrees perpendicular to the long axis of tibia with minimal posterior slope.  Once we had the tibial cut done, we used a lamina spreader, removed excess posterior femoral condyle osteophytes and posterior capsule release.  We injected  the posterior capsule with combination of Marcaine, Exparel and saline.  We then went ahead and checked our gaps, which were symmetric at 6 mm.  We then completed our tibial preparation for the 5 tibia, did our box cut for the 6 left femur and then  trialled those components with a 5 mm poly spacer trial, we were able to get the knee into full extension and had good flexion stability.  Next, we went ahead and resurfaced our patella going from a 23 mm thickness down to 14 mm thickness drilling lug  holes for the 35 patellar button.  We ranged the knee and had excellent patellar tracking with no-touch technique.  We removed all trial components, irrigated thoroughly.  We then dried the bone well and vacuum mixed high viscosity cement cementing the  components all into place in 1 step, tibia, femur and patella with a 6  mm poly spacer in place holding the knee in extension for good compression of the cement and also using a patellar compression clamp while the cement set up.  Once the cement was  hardened, we injected the anterior capsule with a combination of Exparel, saline and Marcaine.   We then went ahead and trialled and felt like tracking was perfect for the patella.  We removed excess cement with quarter-inch curved osteotome and then  selected the real size 6, 7 mm poly placed that on the tibial tray, reduced the knee.  Nice little pop as that medial condyle reduced, very stable knee throughout a full range of motion.  We were able to achieve full extension with excellent patellar  tracking.  We irrigated thoroughly and then repaired the parapatellar arthrotomy with #1 Vicryl suture, followed by 2-0 Vicryl for subcutaneous closure and 4-0 Monocryl for skin.  Steri-Strips applied followed by sterile dressing.  The patient tolerated  surgery well.   PUS D: 09/20/2022 9:43:28 am T: 09/20/2022 10:00:00 am  JOB: [id number]/ [id number]

## 2022-09-20 NOTE — Progress Notes (Signed)
Orthopedic Tech Progress Note Patient Details:  [name] 1946-01-01 [id number] Took CPM off at 2:20pm.  CPM Left Knee CPM Left Knee: On Left Knee Flexion (Degrees): 90 Left Knee Extension (Degrees): 0  Post Interventions Patient Tolerated: Well  Blase Mess 09/20/2022, 2:34 PM

## 2022-09-20 NOTE — Plan of Care (Signed)
  Problem: Education: Goal: Knowledge of the prescribed therapeutic regimen will improve Outcome: Progressing   Problem: Activity: Goal: Ability to avoid complications of mobility impairment will improve Outcome: Progressing Goal: Range of joint motion will improve Outcome: Progressing   Problem: Clinical Measurements: Goal: Postoperative complications will be avoided or minimized Outcome: Progressing   Problem: Pain Management: Goal: Pain level will decrease with appropriate interventions Outcome: Progressing   

## 2022-09-20 NOTE — Progress Notes (Signed)
Orthopedic Tech Progress Note Patient Details:  [name] 12/02/1945 [id number] CPM will be removed at 2:20 pm.  CPM Left Knee CPM Left Knee: On Left Knee Flexion (Degrees): 90 Left Knee Extension (Degrees): 0  Post Interventions Patient Tolerated: Well Ortho Devices Type of Ortho Device: Bone foam zero knee Ortho Device/Splint Location: Left knee Ortho Device/Splint Interventions: Application   Post Interventions Patient Tolerated: Well  [name] 09/20/2022, 10:35 AM

## 2022-09-20 NOTE — Anesthesia Procedure Notes (Signed)
Anesthesia Regional Block: Adductor canal block   Pre-Anesthetic Checklist: , timeout performed,  Correct Patient, Correct Site, Correct Laterality,  Correct Procedure, Correct Position, site marked,  Risks and benefits discussed,  Surgical consent,  Pre-op evaluation,  At surgeon's request and post-op pain management  Laterality: Left  Prep: chloraprep       Needles:  Injection technique: Single-shot  Needle Type: Echogenic Stimulator Needle     Needle Length: 9cm  Needle Gauge: 21     Additional Needles:   Procedures:,,,, ultrasound used (permanent image in chart),,    Narrative:  Start time: 09/20/2022 7:25 AM End time: 09/20/2022 7:30 AM Injection made incrementally with aspirations every 5 mL.  Performed by: Personally  Anesthesiologist: Shelton Silvas, MD  Additional Notes: Discussed risks and benefits of the nerve block in detail, including but not limited vascular injury, permanent nerve damage and infection.   Patient tolerated the procedure well. Local anesthetic introduced in an incremental fashion under minimal resistance after negative aspirations. No paresthesias were elicited. After completion of the procedure, no acute issues were identified and patient continued to be monitored by RN.

## 2022-09-20 NOTE — Brief Op Note (Signed)
09/20/2022  9:38 AM  PATIENT:  [name]  77 y.o. female  PRE-OPERATIVE DIAGNOSIS:  Left knee osteoarthritis, end stage  POST-OPERATIVE DIAGNOSIS:  Left knee osteoarthritis, end stage  PROCEDURE:  Procedure(s) with comments: TOTAL KNEE ARTHROPLASTY (Left) - general and spinal Depuy Attune  SURGEON:  Surgeons and Role:    Beverely Low, MD - Primary  PHYSICIAN ASSISTANT:   ASSISTANTS: Thea Gist, PA-C   ANESTHESIA:   regional and spinal  EBL:  5 mL   BLOOD ADMINISTERED:none  DRAINS: none   LOCAL MEDICATIONS USED:  MARCAINE     SPECIMEN:  No Specimen  DISPOSITION OF SPECIMEN:  N/A  COUNTS:  YES  TOURNIQUET:   Total Tourniquet Time Documented: Thigh (Left) - 82 minutes Total: Thigh (Left) - 82 minutes   DICTATION: .Other Dictation: Dictation Number [id number]  PLAN OF CARE: Admit for overnight observation  PATIENT DISPOSITION:  PACU - hemodynamically stable.   Delay start of Pharmacological VTE agent (>24hrs) due to surgical blood loss or risk of bleeding: no

## 2022-09-20 NOTE — Discharge Instructions (Addendum)
Ice to the knee constantly.  Keep the incision covered and clean and dry for one week, then ok to get it wet in the shower.  Do exercise as instructed every hour, please to prevent stiffness.    DO NOT prop anything under the knee, it will make your knee stiff.  Prop under the ankle to encourage your knee to go straight.   Use the walker while you are up and around for balance.  Wear your support stockings 24/7 to prevent blood clots and take baby aspirin twice daily for 30 days also to prevent blood clots  Follow up with Dr [name] in two weeks in the office, call [phone] for appt  Please call Dr [name] (cell) [phone] with any questions or concerns   INSTRUCTIONS AFTER JOINT REPLACEMENT   Remove items at home which could result in a fall. This includes throw rugs or furniture in walking pathways ICE to the affected joint every three hours while awake for 30 minutes at a time, for at least the first 3-5 days, and then as needed for pain and swelling.  Continue to use ice for pain and swelling. You may notice swelling that will progress down to the foot and ankle.  This is normal after surgery.  Elevate your leg when you are not up walking on it.   Continue to use the breathing machine you got in the hospital (incentive spirometer) which will help keep your temperature down.  It is common for your temperature to cycle up and down following surgery, especially at night when you are not up moving around and exerting yourself.  The breathing machine keeps your lungs expanded and your temperature down.   DIET:  As you were doing prior to hospitalization, we recommend a well-balanced diet.  DRESSING / WOUND CARE / SHOWERING  You may change your dressing 3-5 days after surgery.  Then change the dressing every day with sterile gauze.  Please use good hand washing techniques before changing the dressing.  Do not use any lotions or creams on the incision until instructed by your  surgeon.  ACTIVITY  Increase activity slowly as tolerated, but follow the weight bearing instructions below.   No driving for 6 weeks or until further direction given by your physician.  You cannot drive while taking narcotics.  No lifting or carrying greater than 10 lbs. until further directed by your surgeon. Avoid periods of inactivity such as sitting longer than an hour when not asleep. This helps prevent blood clots.  You may return to work once you are authorized by your doctor.     WEIGHT BEARING   Weight bearing as tolerated with assist device (walker, cane, etc) as directed, use it as long as suggested by your surgeon or therapist, typically at least 4-6 weeks.   EXERCISES  Results after joint replacement surgery are often greatly improved when you follow the exercise, range of motion and muscle strengthening exercises prescribed by your doctor. Safety measures are also important to protect the joint from further injury. Any time any of these exercises cause you to have increased pain or swelling, decrease what you are doing until you are comfortable again and then slowly increase them. If you have problems or questions, call your caregiver or physical therapist for advice.   Rehabilitation is important following a joint replacement. After just a few days of immobilization, the muscles of the leg can become weakened and shrink (atrophy).  These exercises are designed to build  up the tone and strength of the thigh and leg muscles and to improve motion. Often times heat used for twenty to thirty minutes before working out will loosen up your tissues and help with improving the range of motion but do not use heat for the first two weeks following surgery (sometimes heat can increase post-operative swelling).   These exercises can be done on a training (exercise) mat, on the floor, on a table or on a bed. Use whatever works the best and is most comfortable for you.    Use music or  television while you are exercising so that the exercises are a pleasant break in your day. This will make your life better with the exercises acting as a break in your routine that you can look forward to.   Perform all exercises about fifteen times, three times per day or as directed.  You should exercise both the operative leg and the other leg as well.  Exercises include:   Quad Sets - Tighten up the muscle on the front of the thigh (Quad) and hold for 5-10 seconds.   Straight Leg Raises - With your knee straight (if you were given a brace, keep it on), lift the leg to 60 degrees, hold for 3 seconds, and slowly lower the leg.  Perform this exercise against resistance later as your leg gets stronger.  Leg Slides: Lying on your back, slowly slide your foot toward your buttocks, bending your knee up off the floor (only go as far as is comfortable). Then slowly slide your foot back down until your leg is flat on the floor again.  Angel Wings: Lying on your back spread your legs to the side as far apart as you can without causing discomfort.  Hamstring Strength:  Lying on your back, push your heel against the floor with your leg straight by tightening up the muscles of your buttocks.  Repeat, but this time bend your knee to a comfortable angle, and push your heel against the floor.  You may put a pillow under the heel to make it more comfortable if necessary.   A rehabilitation program following joint replacement surgery can speed recovery and prevent re-injury in the future due to weakened muscles. Contact your doctor or a physical therapist for more information on knee rehabilitation.    CONSTIPATION  Constipation is defined medically as fewer than three stools per week and severe constipation as less than one stool per week.  Even if you have a regular bowel pattern at home, your normal regimen is likely to be disrupted due to multiple reasons following surgery.  Combination of anesthesia,  postoperative narcotics, change in appetite and fluid intake all can affect your bowels.   YOU MUST use at least one of the following options; they are listed in order of increasing strength to get the job done.  They are all available over the counter, and you may need to use some, POSSIBLY even all of these options:    Drink plenty of fluids (prune juice may be helpful) and high fiber foods Colace 100 mg by mouth twice a day  Senokot for constipation as directed and as needed Dulcolax (bisacodyl), take with full glass of water  Miralax (polyethylene glycol) once or twice a day as needed.  If you have tried all these things and are unable to have a bowel movement in the first 3-4 days after surgery call either your surgeon or your primary doctor.    If you  experience loose stools or diarrhea, hold the medications until you stool forms back up.  If your symptoms do not get better within 1 week or if they get worse, check with your doctor.  If you experience "the worst abdominal pain ever" or develop nausea or vomiting, please contact the office immediately for further recommendations for treatment.   ITCHING:  If you experience itching with your medications, try taking only a single pain pill, or even half a pain pill at a time.  You can also use Benadryl over the counter for itching or also to help with sleep.   TED HOSE STOCKINGS:  Use stockings on both legs until for at least 2 weeks or as directed by physician office. They may be removed at night for sleeping.  MEDICATIONS:  See your medication summary on the "After Visit Summary" that nursing will review with you.  You may have some home medications which will be placed on hold until you complete the course of blood thinner medication.  It is important for you to complete the blood thinner medication as prescribed.  PRECAUTIONS:  If you experience chest pain or shortness of breath - call 911 immediately for transfer to the hospital emergency  department.   If you develop a fever greater that 101 F, purulent drainage from wound, increased redness or drainage from wound, foul odor from the wound/dressing, or calf pain - CONTACT YOUR SURGEON.                                                   FOLLOW-UP APPOINTMENTS:  If you do not already have a post-op appointment, please call the office for an appointment to be seen by your surgeon.  Guidelines for how soon to be seen are listed in your "After Visit Summary", but are typically between 1-4 weeks after surgery.  OTHER INSTRUCTIONS:   Knee Replacement:  Do not place pillow under knee, focus on keeping the knee straight while resting. CPM instructions: 0-90 degrees, 2 hours in the morning, 2 hours in the afternoon, and 2 hours in the evening. Place foam block, curve side up under heel at all times except when in CPM or when walking.  DO NOT modify, tear, cut, or change the foam block in any way.  POST-OPERATIVE OPIOID TAPER INSTRUCTIONS: It is important to wean off of your opioid medication as soon as possible. If you do not need pain medication after your surgery it is ok to stop day one. Opioids include: Codeine, Hydrocodone(Norco, Vicodin), Oxycodone(Percocet, oxycontin) and hydromorphone amongst others.  Long term and even short term use of opiods can cause: Increased pain response Dependence Constipation Depression Respiratory depression And more.  Withdrawal symptoms can include Flu like symptoms Nausea, vomiting And more Techniques to manage these symptoms Hydrate well Eat regular healthy meals Stay active Use relaxation techniques(deep breathing, meditating, yoga) Do Not substitute Alcohol to help with tapering If you have been on opioids for less than two weeks and do not have pain than it is ok to stop all together.  Plan to wean off of opioids This plan should start within one week post op of your joint replacement. Maintain the same interval or time between taking  each dose and first decrease the dose.  Cut the total daily intake of opioids by one tablet each day Next start to  increase the time between doses. The last dose that should be eliminated is the evening dose.   MAKE SURE YOU:  Understand these instructions.  Get help right away if you are not doing well or get worse.    Thank you for letting us be a part of your medical care team.  It is a privilege we respect greatly.  We hope these instructions will help you stay on track for a fast and full recovery!

## 2022-09-20 NOTE — TOC Transition Note (Signed)
Transition of Care [facility]) - CM/SW Discharge Note   Patient Details  Name: [name] MRN: [id number] Date of Birth: 07-Sep-1945  [facility] CM/SW Contact:  Amada Jupiter, LCSW Phone Number: 09/20/2022, 2:22 PM   Clinical Narrative:     Met with pt who confirms she has needed DME in the home.  OPPT already set up with [facility] ([place]).  No further TOC needs.  Final next level of care: OP Rehab Barriers to Discharge: No Barriers Identified   Patient Goals and CMS Choice      Discharge Placement                         Discharge Plan and Services Additional resources added to the After Visit Summary for                  DME Arranged: N/A DME Agency: NA                  Social Determinants of Health (SDOH) Interventions SDOH Screenings   Food Insecurity: No Food Insecurity (09/20/2022)  Housing: Low Risk  (09/20/2022)  Transportation Needs: No Transportation Needs (09/20/2022)  Utilities: Not At Risk (09/20/2022)  Tobacco Use: Low Risk  (09/20/2022)     Readmission Risk Interventions     No data to display

## 2022-09-20 NOTE — Evaluation (Signed)
Physical Therapy Evaluation Patient Details Name: [name] MRN: [id number] DOB: 09-03-1945 Today's Date: 09/20/2022  History of Present Illness  77 yo female presents to therapy s/p L TKA on 09/20/2022 due to failure of conservative measures. Pt PMH includes but is not limited to: bacterial overgrowth syndrome, carotid artery occlusion, GERD, heart murmur, HDL, HTN and RTC repair.  Clinical Impression  [name] is a 77 y.o. female POD 0 s/p L TKA. Patient reports IND with mobility at baseline. Patient is now limited by functional impairments (see PT problem list below) and requires S for bed mobility and min guard and cues for transfers. Patient was able to ambulate 40 feet with RW and min guard level of assist. Patient instructed in exercise to facilitate ROM and circulation to manage edema. Patient will benefit from continued skilled PT interventions to address impairments and progress towards PLOF. Acute PT will follow to progress mobility and stair training in preparation for safe discharge home with family support and OPPT services.       Assistance Recommended at Discharge Intermittent Supervision/Assistance  If plan is discharge home, recommend the following:  Can travel by private vehicle  A little help with walking and/or transfers;A little help with bathing/dressing/bathroom;Assistance with cooking/housework;Assist for transportation;Help with stairs or ramp for entrance        Equipment Recommendations None recommended by PT (pt reports DME in home setting)  Recommendations for Other Services       Functional Status Assessment Patient has had a recent decline in their functional status and demonstrates the ability to make significant improvements in function in a reasonable and predictable amount of time.     Precautions / Restrictions Precautions Precautions: Knee;Fall Restrictions Weight Bearing Restrictions: No      Mobility  Bed Mobility Overal bed mobility:  Needs Assistance Bed Mobility: Supine to Sit     Supine to sit: Supervision, HOB elevated     General bed mobility comments: min cues    Transfers Overall transfer level: Needs assistance Equipment used: Rolling walker (2 wheels) Transfers: Sit to/from Stand Sit to Stand: Min guard           General transfer comment: cues for proper UE placement    Ambulation/Gait Ambulation/Gait assistance: Min guard Gait Distance (Feet): 40 Feet Assistive device: Rolling walker (2 wheels) Gait Pattern/deviations: Step-to pattern, Antalgic Gait velocity: decreased     General Gait Details: cues for posture and proper distance from RW, pt reports heavy reliance on R LE PLOF and gait pattern will be hard to modify  Stairs            Wheelchair Mobility     Tilt Bed    Modified Rankin (Stroke Patients Only)       Balance Overall balance assessment: Needs assistance Sitting-balance support: Feet supported       Standing balance support: Bilateral upper extremity supported, During functional activity, Reliant on assistive device for balance Standing balance-Leahy Scale: Poor                               Pertinent Vitals/Pain Pain Assessment Pain Assessment: 0-10 Pain Score: 8  (pain decreased to 7/10 with mobility tasks) Pain Location: L knee Pain Descriptors / Indicators: Aching, Burning, Constant, Discomfort, Operative site guarding Pain Intervention(s): Limited activity within patient's tolerance, Monitored during session, Premedicated before session, Repositioned, Ice applied, Patient requesting pain meds-RN notified (IV morphine administered ~ 1.5 hrs  prior to PT eval)    Home Living Family/patient expects to be discharged to:: Private residence Living Arrangements: Alone Available Help at Discharge: Family (daughter from [place] will stay with pt for one wk then sister from [place] will assist for one wk) Type of Home: House Home Access: Level entry        Home Layout: One level;Other (Comment) (2 steps in home to access den and pt reports son has purchased a ramp) Home Equipment: Agricultural consultant (2 wheels);Rollator (4 wheels);Tub bench;Toilet riser Additional Comments: [employer] for home    Prior Function Prior Level of Function : Independent/Modified Independent             Mobility Comments: IND with all ADLs self care tasks, IADLs, yard work       Higher education careers adviser        Extremity/Trunk Assessment        Lower Extremity Assessment Lower Extremity Assessment: LLE deficits/detail LLE Deficits / Details: ankle DF/PF 5/5; SLR < 10 degree lag minimal clearance from bed LLE Sensation: WNL    Cervical / Trunk Assessment Cervical / Trunk Assessment:  (slight head forward)  Communication   Communication: No difficulties  Cognition Arousal/Alertness: Awake/alert Behavior During Therapy: WFL for tasks assessed/performed Overall Cognitive Status: Within Functional Limits for tasks assessed                                          General Comments      Exercises Total Joint Exercises Ankle Circles/Pumps: AROM, Both, 20 reps Quad Sets: AAROM, Left, 5 reps (multmodal cues and minimal voluntary quad engagement) Short Arc Quad: AROM, Left, 5 reps Heel Slides: AROM, Left, 5 reps   Assessment/Plan    PT Assessment Patient needs continued PT services  PT Problem List Decreased strength;Decreased range of motion;Decreased activity tolerance;Decreased balance;Decreased mobility;Decreased coordination;Decreased cognition;Pain       PT Treatment Interventions DME instruction;Gait training;Stair training;Functional mobility training;Therapeutic activities;Therapeutic exercise;Balance training;Neuromuscular re-education;Patient/family education;Modalities    PT Goals (Current goals can be found in the Care Plan section)  Acute Rehab PT Goals Patient Stated Goal: to be able to ambulate with increased  stability PT Goal Formulation: With patient Time For Goal Achievement: 10/04/22 Potential to Achieve Goals: Good    Frequency 7X/week     Co-evaluation               AM-PAC PT "6 Clicks" Mobility  Outcome Measure Help needed turning from your back to your side while in a flat bed without using bedrails?: None Help needed moving from lying on your back to sitting on the side of a flat bed without using bedrails?: None Help needed moving to and from a bed to a chair (including a wheelchair)?: A Little Help needed standing up from a chair using your arms (e.g., wheelchair or bedside chair)?: A Little Help needed to walk in hospital room?: A Little Help needed climbing 3-5 steps with a railing? : A Lot 6 Click Score: 19    End of Session Equipment Utilized During Treatment: Gait belt Activity Tolerance: Patient tolerated treatment well Patient left: in chair;with call bell/phone within reach;with chair alarm set Nurse Communication: Mobility status PT Visit Diagnosis: Unsteadiness on feet (R26.81);Other abnormalities of gait and mobility (R26.89);Muscle weakness (generalized) (M62.81);Difficulty in walking, not elsewhere classified (R26.2);Pain Pain - Right/Left: Left Pain - part of body: Knee    Time:  0981-1914 PT Time Calculation (min) (ACUTE ONLY): 32 min   Charges:   PT Evaluation $PT Eval Low Complexity: 1 Low PT Treatments $Gait Training: 8-22 mins $Therapeutic Exercise: 8-22 mins PT General Charges $$ ACUTE PT VISIT: 1 Visit         Johnny Bridge, PT Acute Rehab   Jacqualyn Posey 09/20/2022, 3:28 PM

## 2022-09-20 NOTE — Anesthesia Procedure Notes (Signed)
Spinal  Start time: 09/20/2022 7:47 AM End time: 09/20/2022 7:49 AM Reason for block: surgical anesthesia Staffing Performed: anesthesiologist  Anesthesiologist: Shelton Silvas, MD Performed by: Shelton Silvas, MD Authorized by: Shelton Silvas, MD   Preanesthetic Checklist Completed: patient identified, IV checked, site marked, risks and benefits discussed, surgical consent, monitors and equipment checked, pre-op evaluation and timeout performed Spinal Block Patient position: sitting Prep: DuraPrep and site prepped and draped Location: L3-4 Injection technique: single-shot Needle Needle type: Pencan  Needle gauge: 24 G Needle length: 10 cm Needle insertion depth: 10 cm Additional Notes Patient tolerated well. No immediate complications.  Functioning IV was confirmed and monitors were applied. Sterile prep and drape, including hand hygiene and sterile gloves were used. The patient was positioned and the back was prepped. The skin was anesthetized with lidocaine. Free flow of clear CSF was obtained prior to injecting local anesthetic into the CSF. The spinal needle aspirated freely following injection. The needle was carefully withdrawn. The patient tolerated the procedure well.

## 2022-09-20 NOTE — Interval H&P Note (Signed)
History and Physical Interval Note:  09/20/2022 7:12 AM  [name]  has presented today for surgery, with the diagnosis of Left knee osteoarthritis.  The various methods of treatment have been discussed with the patient and family. After consideration of risks, benefits and other options for treatment, the patient has consented to  Procedure(s) with comments: TOTAL KNEE ARTHROPLASTY (Left) - general and spinal as a surgical intervention.  The patient's history has been reviewed, patient examined, no change in status, stable for surgery.  I have reviewed the patient's chart and labs.  Questions were answered to the patient's satisfaction.     Verlee Rossetti

## 2022-09-20 NOTE — Anesthesia Postprocedure Evaluation (Signed)
Anesthesia Post Note  Patient: [name]  Procedure(s) Performed: TOTAL KNEE ARTHROPLASTY (Left: Knee)     Patient location during evaluation: PACU Anesthesia Type: Spinal Level of consciousness: oriented and awake and alert Pain management: pain level controlled Vital Signs Assessment: post-procedure vital signs reviewed and stable Respiratory status: spontaneous breathing, respiratory function stable and patient connected to nasal cannula oxygen Cardiovascular status: blood pressure returned to baseline and stable Postop Assessment: no headache, no backache and no apparent nausea or vomiting Anesthetic complications: no  No notable events documented.  Last Vitals:  Vitals:   09/20/22 1217 09/20/22 1306  BP: 106/65 (!) 147/69  Pulse: 74 88  Resp: 18 17  Temp: 36.4 C 36.8 C  SpO2: 100% 98%    Last Pain:  Vitals:   09/20/22 1328  TempSrc:   PainSc: 10-Worst pain ever                 Shelton Silvas

## 2022-09-21 DIAGNOSIS — Z79899 Other long term (current) drug therapy: Secondary | ICD-10-CM | POA: Diagnosis not present

## 2022-09-21 DIAGNOSIS — Z7982 Long term (current) use of aspirin: Secondary | ICD-10-CM | POA: Diagnosis not present

## 2022-09-21 DIAGNOSIS — I10 Essential (primary) hypertension: Secondary | ICD-10-CM | POA: Diagnosis not present

## 2022-09-21 DIAGNOSIS — Z8673 Personal history of transient ischemic attack (TIA), and cerebral infarction without residual deficits: Secondary | ICD-10-CM | POA: Diagnosis not present

## 2022-09-21 DIAGNOSIS — M1712 Unilateral primary osteoarthritis, left knee: Secondary | ICD-10-CM | POA: Diagnosis not present

## 2022-09-21 MED ORDER — GABAPENTIN 300 MG PO CAPS
300.0000 mg | ORAL_CAPSULE | Freq: Three times a day (TID) | ORAL | Status: DC
  Administered 2022-09-21 – 2022-09-22 (×4): 300 mg via ORAL
  Filled 2022-09-21 (×4): qty 1

## 2022-09-21 MED ORDER — HYDROCODONE-ACETAMINOPHEN 7.5-325 MG PO TABS
1.0000 | ORAL_TABLET | Freq: Four times a day (QID) | ORAL | Status: DC | PRN
  Administered 2022-09-21 – 2022-09-22 (×3): 1 via ORAL
  Filled 2022-09-21 (×3): qty 1

## 2022-09-21 NOTE — Plan of Care (Signed)
°  Problem: Education: °Goal: Knowledge of the prescribed therapeutic regimen will improve °Outcome: Progressing °  °Problem: Activity: °Goal: Range of joint motion will improve °Outcome: Progressing °  °Problem: Clinical Measurements: °Goal: Postoperative complications will be avoided or minimized °Outcome: Progressing °  °Problem: Pain Management: °Goal: Pain level will decrease with appropriate interventions °Outcome: Progressing °  °Problem: Safety: °Goal: Ability to remain free from injury will improve °Outcome: Progressing °  °

## 2022-09-21 NOTE — Progress Notes (Signed)
Physical Therapy Treatment Patient Details Name: [name] MRN: [id number] DOB: 12/11/1945 Today's Date: 09/21/2022   History of Present Illness 77 yo female presents to therapy s/p L TKA on 09/20/2022 due to failure of conservative measures. Pt PMH includes but is not limited to: bacterial overgrowth syndrome, carotid artery occlusion, GERD, heart murmur, HDL, HTN and RTC repair.    PT Comments  Pt sitting in recliner on arrival.  Pt had pain meds prior to session yet continues to report increased pain.  Pt also with nausea with mobilizing. Pt only tolerated short distance in hallway and returned to bed to rest.     Assistance Recommended at Discharge Intermittent Supervision/Assistance  If plan is discharge home, recommend the following:  Can travel by private vehicle    A little help with walking and/or transfers;A little help with bathing/dressing/bathroom;Assistance with cooking/housework;Assist for transportation;Help with stairs or ramp for entrance      Equipment Recommendations  None recommended by PT    Recommendations for Other Services       Precautions / Restrictions Precautions Precautions: Knee;Fall Restrictions Weight Bearing Restrictions: No     Mobility  Bed Mobility Overal bed mobility: Needs Assistance Bed Mobility: Sit to Supine       Sit to supine: Mod assist   General bed mobility comments: assist for LEs due to pain    Transfers Overall transfer level: Needs assistance Equipment used: Rolling walker (2 wheels) Transfers: Sit to/from Stand Sit to Stand: Min guard           General transfer comment: verbal cues for UE positioning    Ambulation/Gait Ambulation/Gait assistance: Min guard Gait Distance (Feet): 40 Feet Assistive device: Rolling walker (2 wheels) Gait Pattern/deviations: Step-to pattern, Antalgic Gait velocity: decreased     General Gait Details: cues for posture, step length and RW distance, pt reports heavy reliance  on R LE prior to surgery and gait pattern will be hard to modify; was able to lead with L LE but difficulty for pt today   Stairs             Wheelchair Mobility     Tilt Bed    Modified Rankin (Stroke Patients Only)       Balance                                            Cognition Arousal/Alertness: Awake/alert Behavior During Therapy: WFL for tasks assessed/performed Overall Cognitive Status: Within Functional Limits for tasks assessed                                          Exercises      General Comments        Pertinent Vitals/Pain Pain Assessment Pain Assessment: 0-10 Pain Score: 7  Pain Location: L knee Pain Descriptors / Indicators: Aching, Sore, Guarding Pain Intervention(s): Repositioned, Monitored during session, Premedicated before session, Ice applied    Home Living                          Prior Function            PT Goals (current goals can now be found in the care plan section) Progress towards PT goals: Progressing toward goals  Frequency    7X/week      PT Plan Current plan remains appropriate    Co-evaluation              AM-PAC PT "6 Clicks" Mobility   Outcome Measure  Help needed turning from your back to your side while in a flat bed without using bedrails?: A Little Help needed moving from lying on your back to sitting on the side of a flat bed without using bedrails?: A Little Help needed moving to and from a bed to a chair (including a wheelchair)?: A Little Help needed standing up from a chair using your arms (e.g., wheelchair or bedside chair)?: A Little Help needed to walk in hospital room?: A Little Help needed climbing 3-5 steps with a railing? : A Lot 6 Click Score: 17    End of Session Equipment Utilized During Treatment: Gait belt Activity Tolerance: Patient limited by pain Patient left: in bed;with call bell/phone within reach;with family/visitor  present   PT Visit Diagnosis: Difficulty in walking, not elsewhere classified (R26.2);Pain Pain - Right/Left: Left Pain - part of body: Knee     Time: 1050-1110 PT Time Calculation (min) (ACUTE ONLY): 20 min  Charges:    $Gait Training: 8-22 mins PT General Charges $$ ACUTE PT VISIT: 1 Visit                    Paulino Door, DPT Physical Therapist Acute Rehabilitation Services Office: [phone]    Kati L Payson 09/21/2022, 1:11 PM

## 2022-09-21 NOTE — Progress Notes (Signed)
Physical Therapy Treatment Patient Details Name: [name] MRN: [id number] DOB: 05/30/1945 Today's Date: 09/21/2022   History of Present Illness 77 yo female presents to therapy s/p L TKA on 09/20/2022 due to failure of conservative measures. Pt PMH includes but is not limited to: bacterial overgrowth syndrome, carotid artery occlusion, GERD, heart murmur, HDL, HTN and RTC repair.    PT Comments  Pt performed LE exercises in supine and then assist with ambulating in hallway.  Pt's pain appears improved this afternoon, and she was able to increase ambulation distance.      Assistance Recommended at Discharge Intermittent Supervision/Assistance  If plan is discharge home, recommend the following:  Can travel by private vehicle    A little help with walking and/or transfers;A little help with bathing/dressing/bathroom;Assistance with cooking/housework;Assist for transportation;Help with stairs or ramp for entrance      Equipment Recommendations  None recommended by PT    Recommendations for Other Services       Precautions / Restrictions Precautions Precautions: Knee;Fall Restrictions Weight Bearing Restrictions: No     Mobility  Bed Mobility Overal bed mobility: Needs Assistance Bed Mobility: Sit to Supine, Supine to Sit     Supine to sit: Min assist Sit to supine: Mod assist   General bed mobility comments: assist for LEs due to pain    Transfers Overall transfer level: Needs assistance Equipment used: Rolling walker (2 wheels) Transfers: Sit to/from Stand Sit to Stand: Min guard           General transfer comment: verbal cues for UE positioning    Ambulation/Gait Ambulation/Gait assistance: Min guard Gait Distance (Feet): 80 Feet Assistive device: Rolling walker (2 wheels) Gait Pattern/deviations: Step-to pattern, Antalgic Gait velocity: decreased     General Gait Details: cues for posture, step length and RW distance, was able to lead with L LE  better this afternoon   Stairs             Wheelchair Mobility     Tilt Bed    Modified Rankin (Stroke Patients Only)       Balance                                            Cognition Arousal/Alertness: Awake/alert Behavior During Therapy: WFL for tasks assessed/performed Overall Cognitive Status: Within Functional Limits for tasks assessed                                          Exercises Total Joint Exercises Ankle Circles/Pumps: AROM, Both, 20 reps Quad Sets: AAROM, Left, 10 reps Short Arc Quad: AROM, Left, 10 reps Heel Slides: Left, AAROM, 10 reps Hip ABduction/ADduction: AAROM, Left, 10 reps Straight Leg Raises: AAROM, Left, 10 reps    General Comments        Pertinent Vitals/Pain Pain Assessment Pain Assessment: 0-10 Pain Score: 5  Pain Location: L knee Pain Descriptors / Indicators: Aching, Sore, Guarding Pain Intervention(s): Monitored during session, Repositioned, Premedicated before session, Ice applied    Home Living                          Prior Function            PT Goals (current goals can now  be found in the care plan section) Progress towards PT goals: Progressing toward goals    Frequency    7X/week      PT Plan Current plan remains appropriate    Co-evaluation              AM-PAC PT "6 Clicks" Mobility   Outcome Measure  Help needed turning from your back to your side while in a flat bed without using bedrails?: A Little Help needed moving from lying on your back to sitting on the side of a flat bed without using bedrails?: A Little Help needed moving to and from a bed to a chair (including a wheelchair)?: A Little Help needed standing up from a chair using your arms (e.g., wheelchair or bedside chair)?: A Little Help needed to walk in hospital room?: A Little Help needed climbing 3-5 steps with a railing? : A Lot 6 Click Score: 17    End of Session Equipment  Utilized During Treatment: Gait belt Activity Tolerance: Patient tolerated treatment well Patient left: in bed;with call bell/phone within reach;with family/visitor present Nurse Communication: Mobility status PT Visit Diagnosis: Difficulty in walking, not elsewhere classified (R26.2);Pain Pain - Right/Left: Left Pain - part of body: Knee     Time: 0981-1914 PT Time Calculation (min) (ACUTE ONLY): 30 min  Charges:    $Gait Training: 8-22 mins $Therapeutic Exercise: 8-22 mins PT General Charges $$ ACUTE PT VISIT: 1 Visit                    Paulino Door, DPT Physical Therapist Acute Rehabilitation Services Office: [phone]    [name] 09/21/2022, 2:11 PM

## 2022-09-21 NOTE — Progress Notes (Signed)
Subjective: 1 Day Post-Op Procedure(s) (LRB): TOTAL KNEE ARTHROPLASTY (Left) Patient reports pain as severe.  Reports pain, did not sleep. Some intermittent nausea no vomiting. No other c/o.  Objective: Vital signs in last 24 hours: Temp:  [97.5 F (36.4 C)-98.9 F (37.2 C)] 98.3 F (36.8 C) (07/13 0916) Pulse Rate:  [74-88] 77 (07/13 0916) Resp:  [16-22] 16 (07/13 0916) BP: (106-153)/(48-72) 139/56 (07/13 0916) SpO2:  [94 %-100 %] 95 % (07/13 0916)  Intake/Output from previous day: 07/12 0701 - 07/13 0700 In: 3035.2 [P.O.:480; I.V.:2355.3; IV Piggyback:199.9] Out: 2405 [Urine:2400; Blood:5] Intake/Output this shift: Total I/O In: 240 [P.O.:240] Out: 200 [Urine:200]  No results for input(s): "HGB" in the last 72 hours. No results for input(s): "WBC", "RBC", "HCT", "PLT" in the last 72 hours. No results for input(s): "NA", "K", "CL", "CO2", "BUN", "CREATININE", "GLUCOSE", "CALCIUM" in the last 72 hours. No results for input(s): "LABPT", "INR" in the last 72 hours.  Neurologically intact ABD soft Neurovascular intact Sensation intact distally Intact pulses distally Dorsiflexion/Plantar flexion intact Incision: dressing C/D/I and no drainage No cellulitis present Compartment soft No sign of DVT   Assessment/Plan: 1 Day Post-Op Procedure(s) (LRB): TOTAL KNEE ARTHROPLASTY (Left) Advance diet Up with therapy D/C IV fluids Add Norco and gabapentin for pain, unable to tolerate Oxy If pain still controlled would switch from morphine to dilaudid for breakthrough pain and could trial dilaudid PO Possible d/c tomorrow if pain well controlled and passing PT  Anticipated LOS equal to or greater than 2 midnights due to - Age 26 and older with one or more of the following:  - Obesity  - Expected need for hospital services (PT, OT, Nursing) required for safe  discharge  - Anticipated need for postoperative skilled nursing care or inpatient rehab  - Active co-morbidities:  None OR   - Unanticipated findings during/Post Surgery: Slow post-op progression: GI, pain control, mobility  - Patient is a high risk of re-admission due to: None   [name] 09/21/2022, 9:39 AM

## 2022-09-21 NOTE — Plan of Care (Signed)

## 2022-09-22 DIAGNOSIS — Z8673 Personal history of transient ischemic attack (TIA), and cerebral infarction without residual deficits: Secondary | ICD-10-CM | POA: Diagnosis not present

## 2022-09-22 DIAGNOSIS — Z7982 Long term (current) use of aspirin: Secondary | ICD-10-CM | POA: Diagnosis not present

## 2022-09-22 DIAGNOSIS — M1712 Unilateral primary osteoarthritis, left knee: Secondary | ICD-10-CM | POA: Diagnosis not present

## 2022-09-22 DIAGNOSIS — Z79899 Other long term (current) drug therapy: Secondary | ICD-10-CM | POA: Diagnosis not present

## 2022-09-22 DIAGNOSIS — I10 Essential (primary) hypertension: Secondary | ICD-10-CM | POA: Diagnosis not present

## 2022-09-22 MED ORDER — HYDROCODONE-ACETAMINOPHEN 7.5-325 MG PO TABS: 1.0000 | ORAL_TABLET | Freq: Four times a day (QID) | ORAL | 0 refills | Status: DC | PRN

## 2022-09-22 NOTE — Progress Notes (Signed)
Pt and son provided detailed discharge instructions and all questions answered. Pt's son had concerns regarding d/c plan. TOC notified and addressed concerns. Pt remains appropriate for current d/c plan. Pt taken via wheelchair to front entrance to meet ride.

## 2022-09-22 NOTE — TOC Transition Note (Signed)
Transition of Care [facility]) - CM/SW Discharge Note   Patient Details  Name: [name] MRN: [id number] Date of Birth: 09-02-1945  [facility] CM/SW Contact:  Coralyn Helling, LCSW Phone Number: 09/22/2022, 1:30 PM   Clinical Narrative:   TOC notified at dc that son had concerns about dc. TOC met with patient and son at bedside. Son expressed that he had concerns about patient getting in and out of the car for OPPT and the potential fall risk. TOC explained ortho and PT recommendations and what patient did with PT. Patient also stated " he (MD) thought I was doing dgood, so he probably wont change it." TOC explained that changes can be made outpatient if ortho, OPPT sees that patient needs a higher level of care. Family agreeable to OPPT and ready for dc.     Final next level of care: OP Rehab Barriers to Discharge: No Barriers Identified   Patient Goals and CMS Choice      Discharge Placement                         Discharge Plan and Services Additional resources added to the After Visit Summary for                  DME Arranged: N/A DME Agency: NA                  Social Determinants of Health (SDOH) Interventions SDOH Screenings   Food Insecurity: No Food Insecurity (09/20/2022)  Housing: Low Risk  (09/20/2022)  Transportation Needs: No Transportation Needs (09/20/2022)  Utilities: Not At Risk (09/20/2022)  Tobacco Use: Low Risk  (09/20/2022)     Readmission Risk Interventions     No data to display

## 2022-09-22 NOTE — Progress Notes (Signed)
Orthopedics Progress Note  Subjective: Patient feeling better today and doing better with her therapy. She is ready for discharge home.  Objective:  Vitals:   09/21/22 2106 09/22/22 0536  BP: (!) 134/54 133/61  Pulse: 85 92  Resp: 20 16  Temp: 98.7 F (37.1 C) 99.1 F (37.3 C)  SpO2: 95% 90%    General: Awake and alert  Musculoskeletal: Left knee bandage changed to Aquacel. TED hose placed. No pain with gentle AROM and ankle ROM Neurovascularly intact  Lab Results  Component Value Date   WBC 9.5 09/09/2022   HGB 14.3 09/09/2022   HCT 43.1 09/09/2022   MCV 94.3 09/09/2022   PLT 283 09/09/2022       Component Value Date/Time   NA 140 09/09/2022 1355   K 3.8 09/09/2022 1355   CL 104 09/09/2022 1355   CO2 26 09/09/2022 1355   GLUCOSE 103 (H) 09/09/2022 1355   BUN 15 09/09/2022 1355   CREATININE 0.77 09/09/2022 1355   CREATININE 0.69 10/06/2012 1540   CALCIUM 9.2 09/09/2022 1355   GFRNONAA >60 09/09/2022 1355   GFRAA >60 03/02/2019 0825    Lab Results  Component Value Date   INR 0.9 06/13/2021   INR 0.9 03/21/2020   INR 1.0 03/02/2019    Assessment/Plan: POD #2 s/p Procedure(s): TOTAL KNEE ARTHROPLASTY Home today after therapy Follow up in two weeks.  Outpatient therapy as scheduled  Almedia Balls. [name], MD 09/22/2022 9:35 AM

## 2022-09-22 NOTE — Progress Notes (Signed)
Physical Therapy Treatment Patient Details Name: [name] MRN: [id number] DOB: Oct 11, 1945 Today's Date: 09/22/2022   History of Present Illness 77 yo female presents to therapy s/p L TKA on 09/20/2022 due to failure of conservative measures. Pt PMH includes but is not limited to: bacterial overgrowth syndrome, carotid artery occlusion, GERD, heart murmur, HDL, HTN and RTC repair.    PT Comments  Pt ambulated in hallway and performed LE exercises. Pt provided with HEP handout and had no further questions.  Pt feels ready for d/c home today.      Assistance Recommended at Discharge Intermittent Supervision/Assistance  If plan is discharge home, recommend the following:  Can travel by private vehicle    A little help with walking and/or transfers;A little help with bathing/dressing/bathroom;Assistance with cooking/housework;Assist for transportation;Help with stairs or ramp for entrance      Equipment Recommendations  None recommended by PT    Recommendations for Other Services       Precautions / Restrictions Precautions Precautions: Knee;Fall Restrictions Weight Bearing Restrictions: No     Mobility  Bed Mobility Overal bed mobility: Needs Assistance Bed Mobility: Supine to Sit     Supine to sit: Min guard     General bed mobility comments: utilized gait belt for self assist of Lt LE    Transfers Overall transfer level: Needs assistance Equipment used: Rolling walker (2 wheels) Transfers: Sit to/from Stand Sit to Stand: Min guard           General transfer comment: verbal cues for UE positioning    Ambulation/Gait Ambulation/Gait assistance: Min guard Gait Distance (Feet): 80 Feet Assistive device: Rolling walker (2 wheels) Gait Pattern/deviations: Step-to pattern, Antalgic Gait velocity: decreased     General Gait Details: cues for posture, step length and RW distance   Stairs             Wheelchair Mobility     Tilt Bed    Modified  Rankin (Stroke Patients Only)       Balance                                            Cognition Arousal/Alertness: Awake/alert Behavior During Therapy: WFL for tasks assessed/performed Overall Cognitive Status: Within Functional Limits for tasks assessed                                          Exercises Total Joint Exercises Ankle Circles/Pumps: AROM, Both, 20 reps Quad Sets: AAROM, Left, 10 reps Short Arc Quad: AROM, Left, 10 reps Heel Slides: Left, AAROM, 10 reps Hip ABduction/ADduction: AAROM, Left, 10 reps Straight Leg Raises: AAROM, Left, 10 reps Knee Flexion: AROM, Seated, 10 reps, Left    General Comments        Pertinent Vitals/Pain Pain Assessment Pain Assessment: 0-10 Pain Score: 4  Pain Location: L knee Pain Descriptors / Indicators: Aching, Sore, Guarding Pain Intervention(s): Repositioned, Monitored during session    Home Living                          Prior Function            PT Goals (current goals can now be found in the care plan section) Progress towards PT goals: Progressing toward  goals    Frequency    7X/week      PT Plan Current plan remains appropriate    Co-evaluation              AM-PAC PT "6 Clicks" Mobility   Outcome Measure  Help needed turning from your back to your side while in a flat bed without using bedrails?: A Little Help needed moving from lying on your back to sitting on the side of a flat bed without using bedrails?: A Little Help needed moving to and from a bed to a chair (including a wheelchair)?: A Little Help needed standing up from a chair using your arms (e.g., wheelchair or bedside chair)?: A Little Help needed to walk in hospital room?: A Little Help needed climbing 3-5 steps with a railing? : A Lot 6 Click Score: 17    End of Session Equipment Utilized During Treatment: Gait belt Activity Tolerance: Patient tolerated treatment well Patient  left: with call bell/phone within reach;in chair;with family/visitor present Nurse Communication: Mobility status PT Visit Diagnosis: Difficulty in walking, not elsewhere classified (R26.2);Pain Pain - Right/Left: Left Pain - part of body: Knee     Time: 0957-1020 PT Time Calculation (min) (ACUTE ONLY): 23 min  Charges:    $Gait Training: 8-22 mins $Therapeutic Exercise: 8-22 mins PT General Charges $$ ACUTE PT VISIT: 1 Visit                    Paulino Door, DPT Physical Therapist Acute Rehabilitation Services Office: [phone]  Kati L Payson 09/22/2022, 1:25 PM

## 2022-09-22 NOTE — Discharge Summary (Signed)
In most cases prophylactic antibiotics for Dental procdeures after total joint surgery are not necessary.  Exceptions are as follows:  1. History of prior total joint infection  2. Severely immunocompromised (Organ Transplant, cancer chemotherapy, Rheumatoid biologic meds such as Humera)  3. Poorly controlled diabetes (A1C &gt; 8.0, blood glucose over 200)  If you have one of these conditions, contact your surgeon for an antibiotic prescription, prior to your dental procedure. Orthopedic Discharge Summary        Physician Discharge Summary  Patient ID: [name] MRN: [id number] DOB/AGE: Jun 17, 1945 77 y.o.  Admit date: 09/20/2022 Discharge date: 09/22/2022   Procedures:  Procedure(s) (LRB): TOTAL KNEE ARTHROPLASTY (Left)  Attending Physician:  Dr. Malon Kindle  Admission Diagnoses:   Left knee end stage OA  Discharge Diagnoses:  same   Past Medical History:  Diagnosis Date   Arthritis    Bacterial overgrowth syndrome    Positive HBT   Carotid artery occlusion    GERD (gastroesophageal reflux disease)    Heart murmur    mild AS 03/21/20 echo   Hypercholesteremia    Hypertension    TIA (transient ischemic attack)     PCP: Carylon Perches, MD   Discharged Condition: good  Hospital Course:  Patient underwent the above stated procedure on 09/20/2022. Patient tolerated the procedure well and brought to the recovery room in good condition and subsequently to the floor. Patient had an uncomplicated hospital course and was stable for discharge.   Disposition: Discharge disposition: 01-Home or Self Care      with follow up in 2 weeks    Follow-up Information     Beverely Low, MD. Call in 2 week(s).   Specialty: Orthopedic Surgery Why: call [phone] for appt Contact information: [address] [place] [address] [phone]                 Dental Antibiotics:  In most cases prophylactic antibiotics for Dental  procdeures after total joint surgery are not necessary.  Exceptions are as follows:  1. History of prior total joint infection  2. Severely immunocompromised (Organ Transplant, cancer chemotherapy, Rheumatoid biologic meds such as Humera)  3. Poorly controlled diabetes (A1C &gt; 8.0, blood glucose over 200)  If you have one of these conditions, contact your surgeon for an antibiotic prescription, prior to your dental procedure.  Discharge Instructions     Call MD / Call 911   Complete by: As directed    If you experience chest pain or shortness of breath, CALL 911 and be transported to the hospital emergency room.  If you develope a fever above 101 F, pus (white drainage) or increased drainage or redness at the wound, or calf pain, call your surgeon's office.   Constipation Prevention   Complete by: As directed    Drink plenty of fluids.  Prune juice may be helpful.  You may use a stool softener, such as Colace (over the counter) 100 mg twice a day.  Use MiraLax (over the counter) for constipation as needed.   Diet - low sodium heart healthy   Complete by: As directed    Increase activity slowly as tolerated   Complete by: As directed    Post-operative opioid taper instructions:   Complete by: As directed    POST-OPERATIVE OPIOID TAPER INSTRUCTIONS: It is important to wean off of your opioid medication as soon as possible. If you do not need pain medication after your surgery it is ok  to stop day one. Opioids include: Codeine, Hydrocodone(Norco, Vicodin), Oxycodone(Percocet, oxycontin) and hydromorphone amongst others.  Long term and even short term use of opiods can cause: Increased pain response Dependence Constipation Depression Respiratory depression And more.  Withdrawal symptoms can include Flu like symptoms Nausea, vomiting And more Techniques to manage these symptoms Hydrate well Eat regular healthy meals Stay active Use relaxation techniques(deep breathing,  meditating, yoga) Do Not substitute Alcohol to help with tapering If you have been on opioids for less than two weeks and do not have pain than it is ok to stop all together.  Plan to wean off of opioids This plan should start within one week post op of your joint replacement. Maintain the same interval or time between taking each dose and first decrease the dose.  Cut the total daily intake of opioids by one tablet each day Next start to increase the time between doses. The last dose that should be eliminated is the evening dose.          Allergies as of 09/22/2022       Reactions   Oxycodone Other (See Comments)   "makes me deathly sick"        Medication List     TAKE these medications    acetaminophen 650 MG CR tablet Commonly known as: TYLENOL Take 1,300 mg by mouth every 8 (eight) hours as needed for pain (Knee pain).   amLODipine 5 MG tablet Commonly known as: NORVASC Take 5 mg by mouth daily.   aspirin EC 81 MG tablet Take 1 tablet (81 mg total) by mouth in the morning and at bedtime. What changed: when to take this   atorvastatin 40 MG tablet Commonly known as: LIPITOR Take 1 tablet (40 mg total) by mouth at bedtime.   B-complex with vitamin C tablet Take 1 tablet by mouth daily.   esomeprazole 20 MG capsule Commonly known as: NEXIUM Take 20 mg by mouth daily as needed (acid reflux/indigestion.).   ezetimibe 10 MG tablet Commonly known as: ZETIA Take 10 mg by mouth daily.   Gemtesa 75 MG Tabs Generic drug: Vibegron Take 75 mg by mouth daily.   HYDROcodone-acetaminophen 7.5-325 MG tablet Commonly known as: NORCO Take 1 tablet by mouth every 6 (six) hours as needed for severe pain.   methocarbamol 500 MG tablet Commonly known as: ROBAXIN Take 1 tablet (500 mg total) by mouth every 8 (eight) hours as needed for muscle spasms.   ondansetron 4 MG tablet Commonly known as: Zofran Take 1 tablet (4 mg total) by mouth every 8 (eight) hours as  needed for vomiting, refractory nausea / vomiting or nausea.   Pain Reliever PM 500-25 MG Tabs tablet Generic drug: diphenhydramine-acetaminophen Take 1 tablet by mouth at bedtime.   traMADol 50 MG tablet Commonly known as: ULTRAM Take 1 tablet (50 mg total) by mouth every 6 (six) hours as needed.   Turmeric 500 MG Tabs Take 1,000 mg by mouth daily.   TURMERIC PO Take by mouth.          Signed: Verlee Rossetti 09/22/2022, 9:38 AM  [facility] is now [employer] [address], [place], [place] [address] Phone: [phone] Facebook  Instagram  [employer]

## 2022-09-23 ENCOUNTER — Encounter (HOSPITAL_COMMUNITY): Payer: Self-pay | Admitting: Orthopedic Surgery

## 2022-09-23 DIAGNOSIS — M25561 Pain in right knee: Secondary | ICD-10-CM | POA: Diagnosis not present

## 2022-09-23 DIAGNOSIS — M25661 Stiffness of right knee, not elsewhere classified: Secondary | ICD-10-CM | POA: Diagnosis not present

## 2022-09-24 ENCOUNTER — Observation Stay (HOSPITAL_COMMUNITY)
Admission: EM | Admit: 2022-09-24 | Discharge: 2022-09-25 | Disposition: A | Discharge status: Home / Self Care | Payer: Medicare Other | Attending: Family Medicine | Admitting: Family Medicine

## 2022-09-24 ENCOUNTER — Emergency Department (HOSPITAL_COMMUNITY): Payer: Medicare Other

## 2022-09-24 ENCOUNTER — Other Ambulatory Visit: Payer: Self-pay

## 2022-09-24 ENCOUNTER — Encounter (HOSPITAL_COMMUNITY): Payer: Self-pay | Admitting: Internal Medicine

## 2022-09-24 DIAGNOSIS — E669 Obesity, unspecified: Secondary | ICD-10-CM | POA: Insufficient documentation

## 2022-09-24 DIAGNOSIS — R000 Tachycardia, unspecified: Secondary | ICD-10-CM | POA: Diagnosis not present

## 2022-09-24 DIAGNOSIS — L0390 Cellulitis, unspecified: Secondary | ICD-10-CM | POA: Diagnosis present

## 2022-09-24 DIAGNOSIS — I10 Essential (primary) hypertension: Secondary | ICD-10-CM | POA: Diagnosis not present

## 2022-09-24 DIAGNOSIS — Z7982 Long term (current) use of aspirin: Secondary | ICD-10-CM | POA: Insufficient documentation

## 2022-09-24 DIAGNOSIS — A419 Sepsis, unspecified organism: Secondary | ICD-10-CM | POA: Diagnosis not present

## 2022-09-24 DIAGNOSIS — E782 Mixed hyperlipidemia: Secondary | ICD-10-CM | POA: Diagnosis present

## 2022-09-24 DIAGNOSIS — Z8673 Personal history of transient ischemic attack (TIA), and cerebral infarction without residual deficits: Secondary | ICD-10-CM | POA: Insufficient documentation

## 2022-09-24 DIAGNOSIS — Z79899 Other long term (current) drug therapy: Secondary | ICD-10-CM | POA: Diagnosis not present

## 2022-09-24 DIAGNOSIS — Z96652 Presence of left artificial knee joint: Secondary | ICD-10-CM | POA: Diagnosis not present

## 2022-09-24 DIAGNOSIS — K219 Gastro-esophageal reflux disease without esophagitis: Secondary | ICD-10-CM | POA: Diagnosis present

## 2022-09-24 DIAGNOSIS — M7989 Other specified soft tissue disorders: Secondary | ICD-10-CM | POA: Diagnosis not present

## 2022-09-24 DIAGNOSIS — M1712 Unilateral primary osteoarthritis, left knee: Secondary | ICD-10-CM | POA: Insufficient documentation

## 2022-09-24 DIAGNOSIS — Z6834 Body mass index (BMI) 34.0-34.9, adult: Secondary | ICD-10-CM | POA: Diagnosis not present

## 2022-09-24 DIAGNOSIS — L03116 Cellulitis of left lower limb: Principal | ICD-10-CM | POA: Diagnosis present

## 2022-09-24 DIAGNOSIS — E876 Hypokalemia: Secondary | ICD-10-CM | POA: Diagnosis not present

## 2022-09-24 LAB — COMPREHENSIVE METABOLIC PANEL
ALT: 95 U/L — ABNORMAL HIGH (ref 0–44)
AST: 86 U/L — ABNORMAL HIGH (ref 15–41)
Albumin: 3.2 g/dL — ABNORMAL LOW (ref 3.5–5.0)
Alkaline Phosphatase: 138 U/L — ABNORMAL HIGH (ref 38–126)
Anion gap: 10 (ref 5–15)
BUN: 20 mg/dL (ref 8–23)
CO2: 26 mmol/L (ref 22–32)
Calcium: 8.7 mg/dL — ABNORMAL LOW (ref 8.9–10.3)
Chloride: 103 mmol/L (ref 98–111)
Creatinine, Ser: 0.76 mg/dL (ref 0.44–1.00)
GFR, Estimated: >60 mL/min (ref >60)
Glucose, Bld: 129 mg/dL — ABNORMAL HIGH (ref 70–99)
Potassium: 3.5 mmol/L (ref 3.5–5.1)
Sodium: 139 mmol/L (ref 135–145)
Total Bilirubin: 0.9 mg/dL (ref 0.3–1.2)
Total Protein: 7 g/dL (ref 6.5–8.1)

## 2022-09-24 LAB — CBC WITH DIFFERENTIAL/PLATELET
Abs Immature Granulocytes: 0.07 10*3/uL (ref 0.00–0.07)
Basophils Absolute: 0.1 10*3/uL (ref 0.0–0.1)
Basophils Relative: 1 %
Eosinophils Absolute: 0.5 10*3/uL (ref 0.0–0.5)
Eosinophils Relative: 4 %
HCT: 33.1 % — ABNORMAL LOW (ref 36.0–46.0)
Hemoglobin: 11.1 g/dL — ABNORMAL LOW (ref 12.0–15.0)
Immature Granulocytes: 1 %
Lymphocytes Relative: 25 %
Lymphs Abs: 3 10*3/uL (ref 0.7–4.0)
MCH: 31.4 pg (ref 26.0–34.0)
MCHC: 33.5 g/dL (ref 30.0–36.0)
MCV: 93.8 fL (ref 80.0–100.0)
Monocytes Absolute: 0.7 10*3/uL (ref 0.1–1.0)
Monocytes Relative: 6 %
Neutro Abs: 7.5 10*3/uL (ref 1.7–7.7)
Neutrophils Relative %: 63 %
Platelets: 242 10*3/uL (ref 150–400)
RBC: 3.53 MIL/uL — ABNORMAL LOW (ref 3.87–5.11)
RDW: 12 % (ref 11.5–15.5)
WBC: 11.9 10*3/uL — ABNORMAL HIGH (ref 4.0–10.5)
nRBC: 0 % (ref 0.0–0.2)

## 2022-09-24 LAB — URINALYSIS, W/ REFLEX TO CULTURE (INFECTION SUSPECTED)
Bacteria, UA: NONE SEEN
Bilirubin Urine: NEGATIVE
Glucose, UA: NEGATIVE mg/dL
Hgb urine dipstick: NEGATIVE
Ketones, ur: NEGATIVE mg/dL
Leukocytes,Ua: NEGATIVE
Nitrite: NEGATIVE
Protein, ur: 30 mg/dL — AB
Specific Gravity, Urine: 1.029 (ref 1.005–1.030)
pH: 5 (ref 5.0–8.0)

## 2022-09-24 LAB — APTT: aPTT: 23 seconds — ABNORMAL LOW (ref 24–36)

## 2022-09-24 LAB — PROTIME-INR
INR: 1 (ref 0.8–1.2)
Prothrombin Time: 13 seconds (ref 11.4–15.2)

## 2022-09-24 LAB — LACTIC ACID, PLASMA
Lactic Acid, Venous: 1.2 mmol/L (ref 0.5–1.9)
Lactic Acid, Venous: 1 mmol/L (ref 0.5–1.9)

## 2022-09-24 MED ORDER — TRAZODONE HCL 50 MG PO TABS
50.0000 mg | ORAL_TABLET | Freq: Every day | ORAL | Status: DC
  Administered 2022-09-24: 50 mg via ORAL
  Filled 2022-09-24: qty 1

## 2022-09-24 MED ORDER — ASPIRIN 81 MG PO TBEC
81.0000 mg | DELAYED_RELEASE_TABLET | Freq: Every day | ORAL | Status: DC
  Administered 2022-09-24 – 2022-09-25 (×2): 81 mg via ORAL
  Filled 2022-09-24 (×2): qty 1

## 2022-09-24 MED ORDER — ONDANSETRON HCL 4 MG/2ML IJ SOLN: 4.0000 mg | Freq: Four times a day (QID) | INTRAMUSCULAR | Status: DC | PRN

## 2022-09-24 MED ORDER — SODIUM CHLORIDE 0.9 % IV SOLN
2.0000 g | Freq: Once | INTRAVENOUS | Status: AC
  Administered 2022-09-24: 2 g via INTRAVENOUS
  Filled 2022-09-24: qty 20

## 2022-09-24 MED ORDER — HYDROCODONE-ACETAMINOPHEN 7.5-325 MG PO TABS: 1.0000 | ORAL_TABLET | Freq: Four times a day (QID) | ORAL | Status: DC | PRN

## 2022-09-24 MED ORDER — ACETAMINOPHEN 325 MG PO TABS
650.0000 mg | ORAL_TABLET | Freq: Four times a day (QID) | ORAL | Status: DC | PRN
  Administered 2022-09-24 – 2022-09-25 (×3): 650 mg via ORAL
  Filled 2022-09-24 (×4): qty 2

## 2022-09-24 MED ORDER — ACETAMINOPHEN 650 MG RE SUPP: 650.0000 mg | Freq: Four times a day (QID) | RECTAL | Status: DC | PRN

## 2022-09-24 MED ORDER — CEFAZOLIN SODIUM-DEXTROSE 2-4 GM/100ML-% IV SOLN
2.0000 g | Freq: Three times a day (TID) | INTRAVENOUS | Status: DC
  Administered 2022-09-25 (×2): 2 g via INTRAVENOUS
  Filled 2022-09-24 (×2): qty 100

## 2022-09-24 MED ORDER — SODIUM CHLORIDE 0.9 % IV SOLN: 1.0000 g | INTRAVENOUS | Status: DC

## 2022-09-24 MED ORDER — MIRABEGRON ER 25 MG PO TB24
25.0000 mg | ORAL_TABLET | Freq: Every day | ORAL | Status: DC
  Administered 2022-09-24 – 2022-09-25 (×2): 25 mg via ORAL
  Filled 2022-09-24 (×2): qty 1

## 2022-09-24 MED ORDER — ENOXAPARIN SODIUM 40 MG/0.4ML IJ SOSY: 40.0000 mg | PREFILLED_SYRINGE | INTRAMUSCULAR | Status: DC

## 2022-09-24 MED ORDER — ATORVASTATIN CALCIUM 40 MG PO TABS
40.0000 mg | ORAL_TABLET | Freq: Every day | ORAL | Status: DC
  Administered 2022-09-24: 40 mg via ORAL
  Filled 2022-09-24: qty 1

## 2022-09-24 MED ORDER — PANTOPRAZOLE SODIUM 40 MG PO TBEC
40.0000 mg | DELAYED_RELEASE_TABLET | Freq: Every day | ORAL | Status: DC
  Administered 2022-09-24 – 2022-09-25 (×2): 40 mg via ORAL
  Filled 2022-09-24 (×2): qty 1

## 2022-09-24 MED ORDER — AMLODIPINE BESYLATE 5 MG PO TABS
5.0000 mg | ORAL_TABLET | Freq: Every day | ORAL | Status: DC
  Administered 2022-09-24 – 2022-09-25 (×2): 5 mg via ORAL
  Filled 2022-09-24 (×2): qty 1

## 2022-09-24 MED ORDER — EZETIMIBE 10 MG PO TABS
10.0000 mg | ORAL_TABLET | Freq: Every day | ORAL | Status: DC
  Administered 2022-09-24 – 2022-09-25 (×2): 10 mg via ORAL
  Filled 2022-09-24 (×2): qty 1

## 2022-09-24 MED ORDER — ONDANSETRON HCL 4 MG PO TABS: 4.0000 mg | ORAL_TABLET | Freq: Four times a day (QID) | ORAL | Status: DC | PRN

## 2022-09-24 NOTE — ED Triage Notes (Signed)
Pt had left knee replacement on Friday, states she started noticing a red place on her left inner thigh yesterday that has gotten worse and is now hot to the touch. Is worried about possible infection

## 2022-09-24 NOTE — ED Notes (Signed)
ED Provider at bedside. 

## 2022-09-24 NOTE — ED Notes (Signed)
X-ray at bedside

## 2022-09-24 NOTE — ED Notes (Signed)
Report called to [facility]

## 2022-09-24 NOTE — Progress Notes (Addendum)
PROGRESS NOTE  [name]:[id number] DOB: 25-Mar-1945 DOA: 09/24/2022 PCP: Carylon Perches, MD  Brief History:  77 year old female with a history of CVA/TIA, hypertension, hyperlipidemia, GERD, mild aortic stenosis, left CEA 03/27/2020 presented with pain and erythema on her left medial thigh that started on 09/22/2022 evening.  Notably, the patient had a left TKA performed by Dr. [name] on 09/20/2022.  She was discharged home in stable condition in the morning of 09/22/2022.  She was discharged home with aspirin 81 mg twice daily for DVT prophylaxis.  Later that evening, the patient noted some irritation and pain on the left medial thigh.  She felt like there was a foreign body in the thigh, and she manipulated it with her fingers.  In the morning of 09/23/2022, the patient noted increasing edema and erythema on the left medial thigh.  She denies any other recent injuries to the leg.  As result, the patient presented for further evaluation and treatment.  She denies any fevers, chills, chest pain, shortness of breath, nausea, vomiting or diarrhea.  In the ED, the patient was afebrile and hemodynamically stable with oxygen saturation 96% room air.  WBC 11.9, hemoglobin 11.1, platelets 242.  Sodium 139, potassium 3.5, bicarbonate 26, serum creatinine 0.76.  Lactic acid peaked at 1.2.  EKG shows sinus rhythm with nonspecific T wave change.  UA was negative for pyuria.  The patient was given a dose of ceftriaxone.  EDP spoke with [facility] (Dr.Olin) who agreed to consult once the patient was transferred to [facility].  Dr. Devonne Doughty will see the patient after transfer.   Assessment/Plan: Nonpurulent cellulitis of the leg -Discontinue ceftriaxone -Start cefazolin -Patient states that she had the plastic portion of her ice packs open to that area of her left medial thigh -Appreciate Ortho--Dr. [name] to see  S/p Left Knee TKA -post op care per Dr. [name] -continue aspirin and prn  norco  Essential hypertension -Restart amlodipine  Mixed hyperlipidemia -continue statin and zetia  Left carotid artery stenosis history S/p Left CEA 03/27/2020 (Dr. Darrick Penna) US carotid on 02/16/21 showed bilateral 1-39% stenosis Continue ASA  Obesity (BMI 30-39.9) BMI 34.19 Lifestyle modification        Family Communication:   son updated at bedside 7/16  Consultants:  [facility]--[place]  Code Status:  FULL   DVT Prophylaxis:  [place] Heparin / [place] Lovenox   Procedures: As Listed in Progress Note Above  Antibiotics: Ceftriaxone x 1 Cefazolin 7/16>>     Total time spent 35 minutes.  Greater than 50% spent face to face counseling and coordinating care.  Subjective: Patient denies fevers, chills, headache, chest pain, dyspnea, nausea, vomiting, diarrhea, abdominal pain, dysuria, hematuria, hematochezia, and melena.   Objective: Vitals:   09/24/22 0551 09/24/22 0600 09/24/22 0630 09/24/22 0700  BP:  (!) 142/66 132/63 (!) 142/62  Pulse:  90 88 93  Resp:  (!) 21 20 (!) 32  Temp: 98.2 F (36.8 C)     TempSrc:      SpO2:  94% 94% 94%  Weight:      Height:        Intake/Output Summary (Last 24 hours) at 09/24/2022 0719 Last data filed at 09/24/2022 0500 Gross per 24 hour  Intake 100 ml  Output --  Net 100 ml   Weight change:  Exam:  General:  Pt is alert, follows commands appropriately, not in acute distress HEENT: No icterus, No thrush, No neck mass, [place]/AT Cardiovascular:  RRR, S1/S2, no rubs, no gallops Respiratory: CTA bilaterally, no wheezing, no crackles, no rhonchi Abdomen: Soft/+BS, non tender, non distended, no guarding Extremities: see pics below of left leg         Data Reviewed: I have personally reviewed following labs and imaging studies Basic Metabolic Panel: Recent Labs  Lab 09/24/22 0335  NA 139  K 3.5  CL 103  CO2 26  GLUCOSE 129*  BUN 20  CREATININE 0.76  CALCIUM 8.7*   Liver Function Tests: Recent Labs  Lab  09/24/22 0335  AST 86*  ALT 95*  ALKPHOS 138*  BILITOT 0.9  PROT 7.0  ALBUMIN 3.2*   No results for input(s): "LIPASE", "AMYLASE" in the last 168 hours. No results for input(s): "AMMONIA" in the last 168 hours. Coagulation Profile: Recent Labs  Lab 09/24/22 0335  INR 1.0   CBC: Recent Labs  Lab 09/24/22 0335  WBC 11.9*  NEUTROABS 7.5  HGB 11.1*  HCT 33.1*  MCV 93.8  PLT 242   Cardiac Enzymes: No results for input(s): "CKTOTAL", "CKMB", "CKMBINDEX", "TROPONINI" in the last 168 hours. BNP: Invalid input(s): "POCBNP" CBG: No results for input(s): "GLUCAP" in the last 168 hours. HbA1C: No results for input(s): "HGBA1C" in the last 72 hours. Urine analysis:    Component Value Date/Time   COLORURINE YELLOW 09/24/2022 0340   APPEARANCEUR HAZY (A) 09/24/2022 0340   APPEARANCEUR Clear 10/22/2021 1607   LABSPEC 1.029 09/24/2022 0340   PHURINE 5.0 09/24/2022 0340   GLUCOSEU NEGATIVE 09/24/2022 0340   HGBUR NEGATIVE 09/24/2022 0340   BILIRUBINUR NEGATIVE 09/24/2022 0340   BILIRUBINUR Negative 10/22/2021 1607   KETONESUR NEGATIVE 09/24/2022 0340   PROTEINUR 30 (A) 09/24/2022 0340   NITRITE NEGATIVE 09/24/2022 0340   LEUKOCYTESUR NEGATIVE 09/24/2022 0340   Sepsis Labs: @LABRCNTIP (procalcitonin:4,lacticidven:4) ) Recent Results (from the past 240 hour(s))  Blood Culture (routine x 2)     Status: None (Preliminary result)   Collection Time: 09/24/22  3:19 AM   Specimen: BLOOD  Result Value Ref Range Status   Specimen Description BLOOD BLOOD RIGHT ARM  Final   Special Requests   Final    BOTTLES DRAWN AEROBIC AND ANAEROBIC Blood Culture adequate volume Performed at [facility], [address], [place], [place] [address]    Culture PENDING  Incomplete   Report Status PENDING  Incomplete  Blood Culture (routine x 2)     Status: None (Preliminary result)   Collection Time: 09/24/22  3:24 AM   Specimen: BLOOD  Result Value Ref Range Status   Specimen Description  BLOOD BLOOD LEFT WRIST  Final   Special Requests   Final    BOTTLES DRAWN AEROBIC AND ANAEROBIC Blood Culture adequate volume Performed at [facility], [address] [place], [place] [address]    Culture PENDING  Incomplete   Report Status PENDING  Incomplete     Scheduled Meds: Continuous Infusions:  [START ON 09/25/2022] cefTRIAXone (ROCEPHIN)  IV      Procedures/Studies: DG Chest Port 1 View  Result Date: 09/24/2022 CLINICAL DATA:  Sepsis EXAM: PORTABLE CHEST 1 VIEW COMPARISON:  02/09/2013 FINDINGS: The heart size and mediastinal contours are within normal limits. Both lungs are clear. The visualized skeletal structures are unremarkable. IMPRESSION: No active disease. Electronically Signed   By: Helyn Numbers M.D.   On: 09/24/2022 04:03    Catarina Hartshorn, DO  [facility]  If 7PM-7AM, please contact night-coverage [email or web address] Password [facility] 09/24/2022, 7:19 AM   LOS: 0 days

## 2022-09-24 NOTE — H&P (Signed)
History and Physical    Patient: [name]:[id number] DOB: September 15, 1945 DOA: 09/24/2022 DOS: the patient was seen and examined on 09/24/2022 PCP: Carylon Perches, MD  Patient coming from: Home  Chief Complaint:  Chief Complaint  Patient presents with   Post-op Problem   HPI: [name] is a 77 y.o. female with medical history significant of hypertension, hyperlipidemia, GERD, left knee osteoarthritis status post left total knee arthroplasty (09/20/2022) by Dr. [name] at [facility] who presents to the emergency department for evaluation of swelling, redness and pain of left thigh.  Patient states that she felt a bump on her left thigh on Sunday, she scratched it and by yesterday, she noted that the area surrounding the bump has changed to red and continue to spread outwards.  She also endorsed some pain in the affected area.  She was worried about infection and blood clots so she presented to the ED for further evaluation and management.  Patient was placed on aspirin for DVT prophylaxis.  ED Course:  In the emergency department, patient was tachypneic with a respiratory rate of 26/min, BP was 154/87, other vital signs were within normal range.  Workup in the ED showed WBC 11.9, hemoglobin 11.1, hematocrit 33.1.  BMP was normal except for blood glucose of 129, albumin 3.2, AST 86, ALT 95, ALP 138 lactic acid was normal, urinalysis was normal.  Blood culture pending. Chest x-ray showed no active disease Patient was started on IV ceftriaxone. Dr. Charlann Boxer, on-call surgeon for [facility] was consulted and agreed with treatment for cellulitis and that patient can be transferred to [facility] on the hospitalist service and to consult Dr. Devonne Doughty in the morning.  Hospitalist was asked to admit patient for further evaluation and management.  Review of Systems: Review of systems as noted in the HPI. All other systems reviewed and are negative.   Past Medical History:  Diagnosis Date   Arthritis     Bacterial overgrowth syndrome    Positive HBT   Carotid artery occlusion    GERD (gastroesophageal reflux disease)    Heart murmur    mild AS 03/21/20 echo   Hypercholesteremia    Hypertension    TIA (transient ischemic attack)    Past Surgical History:  Procedure Laterality Date   ABDOMINAL HYSTERECTOMY     BACTERIAL OVERGROWTH TEST N/A 01/07/2013   Procedure: BACTERIAL OVERGROWTH TEST;  Surgeon: Corbin Ade, MD;  Location: [facility] SUITE;  Service: Endoscopy;  Laterality: N/A;  7:30   Bladder Tack     x 2   CATARACT EXTRACTION W/ INTRAOCULAR LENS IMPLANT Bilateral    COLONOSCOPY  09/2005   Dr. Lovell Sheehan: normal. Due for screening 2017   ENDARTERECTOMY Left 03/27/2020   Procedure: LEFT CAROTID ENDARTERECTOMY;  Surgeon: Sherren Kerns, MD;  Location[facility] OR;  Service: Vascular;  Laterality: Left;   ESOPHAGOGASTRODUODENOSCOPY N/A 10/12/2012   RMR: Small inlet patch.  Small hiatal hernia.  Status post gastric biopsy, negative H.pylori   ESOPHAGOGASTRODUODENOSCOPY (EGD) WITH ESOPHAGEAL DILATION  02/2010   Dr. Jena Gauss: non-critical Schatzki's ring s/p dilation with 84 F dilator, small hiatal hernia, otherwise normal   FRACTURE SURGERY     left wrist   IR ANGIO EXTERNAL CAROTID SEL EXT CAROTID BILAT MOD SED  03/02/2019   IR ANGIO INTRA EXTRACRAN SEL INTERNAL CAROTID BILAT MOD SED  03/02/2019   IR ANGIO VERTEBRAL SEL VERTEBRAL BILAT MOD SED  03/02/2019   IR US GUIDE VASC ACCESS RIGHT  03/02/2019  KNEE ARTHROSCOPY     Bilateral (torn meniscus)   ROTATOR CUFF REPAIR     TOTAL KNEE ARTHROPLASTY Left 09/20/2022   Procedure: TOTAL KNEE ARTHROPLASTY;  Surgeon: Beverely Low, MD;  Location: [facility] ORS;  Service: Orthopedics;  Laterality: Left;  general and spinal   TUBAL LIGATION      Social History:  reports that she has never smoked. She has never used smokeless tobacco. She reports that she does not drink alcohol and does not use drugs.   Allergies  Allergen Reactions   Oxycodone Other  (See Comments)    "makes me deathly sick"    Family History  Problem Relation Age of Onset   Colon cancer Neg Hx      Prior to Admission medications   Medication Sig Start Date End Date Taking? Authorizing Provider  acetaminophen (TYLENOL) 650 MG CR tablet Take 1,300 mg by mouth every 8 (eight) hours as needed for pain (Knee pain).    [provider]  amLODipine (NORVASC) 5 MG tablet Take 5 mg by mouth daily. 04/03/15   [provider]  aspirin EC 81 MG tablet Take 1 tablet (81 mg total) by mouth in the morning and at bedtime. 09/20/22 10/20/22  Beverely Low, MD  atorvastatin (LIPITOR) 40 MG tablet Take 1 tablet (40 mg total) by mouth at bedtime. 06/14/21   Catarina Hartshorn, MD  B Complex-C (B-COMPLEX WITH VITAMIN C) tablet Take 1 tablet by mouth daily.    [provider]  diphenhydramine-acetaminophen (PAIN RELIEVER PM) 25-500 MG TABS tablet Take 1 tablet by mouth at bedtime.    [provider]  esomeprazole (NEXIUM) 20 MG capsule Take 20 mg by mouth daily as needed (acid reflux/indigestion.).    [provider]  ezetimibe (ZETIA) 10 MG tablet Take 10 mg by mouth daily. 06/15/22   [provider]  HYDROcodone-acetaminophen (NORCO) 7.5-325 MG tablet Take 1 tablet by mouth every 6 (six) hours as needed for severe pain. 09/22/22   Beverely Low, MD  methocarbamol (ROBAXIN) 500 MG tablet Take 1 tablet (500 mg total) by mouth every 8 (eight) hours as needed for muscle spasms. 09/20/22   Beverely Low, MD  ondansetron (ZOFRAN) 4 MG tablet Take 1 tablet (4 mg total) by mouth every 8 (eight) hours as needed for vomiting, refractory nausea / vomiting or nausea. 09/20/22 09/20/23  Beverely Low, MD  traMADol (ULTRAM) 50 MG tablet Take 1 tablet (50 mg total) by mouth every 6 (six) hours as needed. 09/20/22   Beverely Low, MD  Turmeric 500 MG TABS Take 1,000 mg by mouth daily.    [provider]  TURMERIC PO Take by mouth.    [provider]   Vibegron (GEMTESA) 75 MG TABS Take 75 mg by mouth daily. 09/12/21   Summerlin, Regan Rakers, PA-C    Physical Exam: BP 132/63   Pulse 88   Temp 98.2 F (36.8 C)   Resp 20   Ht 5\' 1"  (1.549 m)   Wt 80.9 kg   SpO2 94%   BMI 33.70 kg/m   General: 77 y.o. year-old female well developed well nourished in no acute distress.  Alert and oriented x3. HEENT: NCAT, EOMI Neck: Supple, trachea medial Cardiovascular: Regular rate and rhythm with no rubs or gallops.  No thyromegaly or JVD noted.  No lower extremity edema. 2/4 pulses in all 4 extremities. Respiratory: Clear to auscultation with no wheezes or rales. Good inspiratory effort. Abdomen: Soft, nontender nondistended with normal bowel sounds x4  quadrants. Muskuloskeletal: No cyanosis, clubbing or edema noted bilaterally Neuro: CN II-XII intact, strength 5/5 x 4, sensation, reflexes intact Skin: Erythema of left thigh which was warm to touch, bruising of left upper thigh was also noted Psychiatry: Judgement and insight appear normal. Mood is appropriate for condition and setting           Labs on Admission:  Basic Metabolic Panel: Recent Labs  Lab 09/24/22 0335  NA 139  K 3.5  CL 103  CO2 26  GLUCOSE 129*  BUN 20  CREATININE 0.76  CALCIUM 8.7*   Liver Function Tests: Recent Labs  Lab 09/24/22 0335  AST 86*  ALT 95*  ALKPHOS 138*  BILITOT 0.9  PROT 7.0  ALBUMIN 3.2*   No results for input(s): "LIPASE", "AMYLASE" in the last 168 hours. No results for input(s): "AMMONIA" in the last 168 hours. CBC: Recent Labs  Lab 09/24/22 0335  WBC 11.9*  NEUTROABS 7.5  HGB 11.1*  HCT 33.1*  MCV 93.8  PLT 242   Cardiac Enzymes: No results for input(s): "CKTOTAL", "CKMB", "CKMBINDEX", "TROPONINI" in the last 168 hours.  BNP (last 3 results) No results for input(s): "BNP" in the last 8760 hours.  ProBNP (last 3 results) No results for input(s): "PROBNP" in the last 8760 hours.  CBG: No results for input(s):  "GLUCAP" in the last 168 hours.  Radiological Exams on Admission: DG Chest Port 1 View  Result Date: 09/24/2022 CLINICAL DATA:  Sepsis EXAM: PORTABLE CHEST 1 VIEW COMPARISON:  02/09/2013 FINDINGS: The heart size and mediastinal contours are within normal limits. Both lungs are clear. The visualized skeletal structures are unremarkable. IMPRESSION: No active disease. Electronically Signed   By: Helyn Numbers M.D.   On: 09/24/2022 04:03    EKG: I independently viewed the EKG done and my findings are as followed: Sinus tachycardia at a rate of 106 bpm  Assessment/Plan Present on Admission:  Cellulitis of left thigh  Essential hypertension  Mixed hyperlipidemia  GERD without esophagitis  Principal Problem:   Cellulitis of left thigh Active Problems:   Mixed hyperlipidemia   GERD without esophagitis   Essential hypertension   Osteoarthritis of left knee  Cellulitis of left thigh Continue IV ceftriaxone Continue Norco as needed  Left knee osteoarthritis s/p total knee arthroplasty Orthopedic surgeon on-call for [facility] was consulted regarding patient's cellulitis.  Due to recent arthroplasty (7/12) by Dr. [name], it was advised for patient to be admitted to [facility] and to consult with Dr. [name] on arrival to [facility] Continue aspirin, Norco  Essential hypertension Continue amlodipine  Mixed hyperlipidemia Continue Lipitor, Zetia  GERD Continue Protonix   DVT prophylaxis: Lovenox  Advance Care Planning: Full code  Consults: Orthopedic surgery (by [facility] EDP)  Family Communication: Son at bedside (all questions answered to satisfaction)  Severity of Illness: The appropriate patient status for this patient is INPATIENT. Inpatient status is judged to be reasonable and necessary in order to provide the required intensity of service to ensure the patient's safety. The patient's presenting symptoms, physical exam findings, and initial radiographic and laboratory data in the  context of their chronic comorbidities is felt to place them at high risk for further clinical deterioration. Furthermore, it is not anticipated that the patient will be medically stable for discharge from the hospital within 2 midnights of admission.   * I certify that at the point of admission it is my clinical judgment that the patient will require inpatient hospital care spanning beyond 2 midnights  from the point of admission due to high intensity of service, high risk for further deterioration and high frequency of surveillance required.*  Author: Frankey Shown, DO 09/24/2022 6:51 AM  For on call review [email or web address].

## 2022-09-24 NOTE — Hospital Course (Signed)
77 year old female with a history of CVA/TIA, hypertension, hyperlipidemia, GERD, mild aortic stenosis, left CEA 03/27/2020 presented with pain and erythema on her left medial thigh that started on 09/22/2022 evening.  Notably, the patient had a left TKA performed by Dr. [name] on 09/20/2022.  She was discharged home in stable condition in the morning of 09/22/2022.  She was discharged home with aspirin 81 mg twice daily for DVT prophylaxis.  Later that evening, the patient noted some irritation and pain on the left medial thigh.  She felt like there was a foreign body in the thigh, and she manipulated it with her fingers.  In the morning of 09/23/2022, the patient noted increasing edema and erythema on the left medial thigh.  She denies any other recent injuries to the leg.  As result, the patient presented for further evaluation and treatment.  She denies any fevers, chills, chest pain, shortness of breath, nausea, vomiting or diarrhea.  In the ED, the patient was afebrile and hemodynamically stable with oxygen saturation 96% room air.  WBC 11.9, hemoglobin 11.1, platelets 242.  Sodium 139, potassium 3.5, bicarbonate 26, serum creatinine 0.76.  Lactic acid peaked at 1.2.  EKG shows sinus rhythm with nonspecific T wave change.  UA was negative for pyuria.  The patient was given a dose of ceftriaxone.  EDP spoke with [facility] (Dr.Olin) who agreed to consult once the patient was transferred to [facility].  Dr. Devonne Doughty will see the patient after transfer.

## 2022-09-24 NOTE — ED Notes (Signed)
Pt left with [facility]. Report has been called to Theatre stage manager at [employer].

## 2022-09-24 NOTE — ED Provider Notes (Signed)
[place] EMERGENCY DEPARTMENT AT [facility] Provider Note   CSN: [id number] Arrival date & time: 09/24/22  0256     History  Chief Complaint  Patient presents with   Post-op Problem    [name] is a 77 y.o. female.  Patient presents to the emergency department for evaluation of pain, redness, swelling of the left leg.  Patient had left total knee replacement 3 days ago at [facility].  Yesterday she started to notice a small bump on the inner portion of her thigh with some surrounding redness.  This redness has progressed.  Patient worried about infection and blood clot.  She is currently on aspirin for DVT prophylaxis.       Home Medications Prior to Admission medications   Medication Sig Start Date End Date Taking? Authorizing Provider  acetaminophen (TYLENOL) 650 MG CR tablet Take 1,300 mg by mouth every 8 (eight) hours as needed for pain (Knee pain).    [provider]  amLODipine (NORVASC) 5 MG tablet Take 5 mg by mouth daily. 04/03/15   [provider]  aspirin EC 81 MG tablet Take 1 tablet (81 mg total) by mouth in the morning and at bedtime. 09/20/22 10/20/22  Beverely Low, MD  atorvastatin (LIPITOR) 40 MG tablet Take 1 tablet (40 mg total) by mouth at bedtime. 06/14/21   Catarina Hartshorn, MD  B Complex-C (B-COMPLEX WITH VITAMIN C) tablet Take 1 tablet by mouth daily.    [provider]  diphenhydramine-acetaminophen (PAIN RELIEVER PM) 25-500 MG TABS tablet Take 1 tablet by mouth at bedtime.    [provider]  esomeprazole (NEXIUM) 20 MG capsule Take 20 mg by mouth daily as needed (acid reflux/indigestion.).    [provider]  ezetimibe (ZETIA) 10 MG tablet Take 10 mg by mouth daily. 06/15/22   [provider]  HYDROcodone-acetaminophen (NORCO) 7.5-325 MG tablet Take 1 tablet by mouth every 6 (six) hours as needed for severe pain. 09/22/22   Beverely Low, MD  methocarbamol (ROBAXIN) 500 MG tablet Take 1 tablet (500  mg total) by mouth every 8 (eight) hours as needed for muscle spasms. 09/20/22   Beverely Low, MD  ondansetron (ZOFRAN) 4 MG tablet Take 1 tablet (4 mg total) by mouth every 8 (eight) hours as needed for vomiting, refractory nausea / vomiting or nausea. 09/20/22 09/20/23  Beverely Low, MD  traMADol (ULTRAM) 50 MG tablet Take 1 tablet (50 mg total) by mouth every 6 (six) hours as needed. 09/20/22   Beverely Low, MD  Turmeric 500 MG TABS Take 1,000 mg by mouth daily.    [provider]  TURMERIC PO Take by mouth.    [provider]  Vibegron (GEMTESA) 75 MG TABS Take 75 mg by mouth daily. 09/12/21   Summerlin, Regan Rakers, PA-C      Allergies    Oxycodone    Review of Systems   Review of Systems  Physical Exam Updated Vital Signs BP (!) 154/87   Pulse 93   Temp 98.3 F (36.8 C) (Oral)   Resp (!) 26   Ht 5\' 1"  (1.549 m)   Wt 80.9 kg   SpO2 94%   BMI 33.70 kg/m  Physical Exam Vitals and nursing note reviewed.  Constitutional:      General: She is not in acute distress.    Appearance: She is well-developed.  HENT:     Head: Normocephalic and atraumatic.     Mouth/Throat:     Mouth:  Mucous membranes are moist.  Eyes:     General: Vision grossly intact. Gaze aligned appropriately.     Extraocular Movements: Extraocular movements intact.     Conjunctiva/sclera: Conjunctivae normal.  Cardiovascular:     Rate and Rhythm: Normal rate and regular rhythm.     Pulses: Normal pulses.     Heart sounds: Normal heart sounds, S1 normal and S2 normal. No murmur heard.    No friction rub. No gallop.  Pulmonary:     Effort: Pulmonary effort is normal. No respiratory distress.     Breath sounds: Normal breath sounds.  Abdominal:     General: Bowel sounds are normal.     Palpations: Abdomen is soft.     Tenderness: There is no abdominal tenderness. There is no guarding or rebound.     Hernia: No hernia is present.  Musculoskeletal:        General: No swelling.      Cervical back: Full passive range of motion without pain, normal range of motion and neck supple. No spinous process tenderness or muscular tenderness. Normal range of motion.     Right lower leg: No edema.     Left lower leg: No edema.  Skin:    General: Skin is warm and dry.     Capillary Refill: Capillary refill takes less than 2 seconds.     Findings: Erythema present. No ecchymosis, rash or wound.     Comments: Bruising medial left upper thigh  Neurological:     General: No focal deficit present.     Mental Status: She is alert and oriented to person, place, and time.     GCS: GCS eye subscore is 4. GCS verbal subscore is 5. GCS motor subscore is 6.     Cranial Nerves: Cranial nerves 2-12 are intact.     Sensory: Sensation is intact.     Motor: Motor function is intact.     Coordination: Coordination is intact.  Psychiatric:        Attention and Perception: Attention normal.        Mood and Affect: Mood normal.        Speech: Speech normal.        Behavior: Behavior normal.     ED Results / Procedures / Treatments   Labs (all labs ordered are listed, but only abnormal results are displayed) Labs Reviewed  CBC WITH DIFFERENTIAL/PLATELET - Abnormal; Notable for the following components:      Result Value   WBC 11.9 (*)    RBC 3.53 (*)    Hemoglobin 11.1 (*)    HCT 33.1 (*)    All other components within normal limits  APTT - Abnormal; Notable for the following components:   aPTT 23 (*)    All other components within normal limits  CULTURE, BLOOD (ROUTINE X 2)  CULTURE, BLOOD (ROUTINE X 2)  LACTIC ACID, PLASMA  PROTIME-INR  LACTIC ACID, PLASMA  COMPREHENSIVE METABOLIC PANEL  URINALYSIS, W/ REFLEX TO CULTURE (INFECTION SUSPECTED)    EKG EKG Interpretation Date/Time:  Tuesday September 24 2022 03:25:24 EDT Ventricular Rate:  106 PR Interval:  123 QRS Duration:  86 QT Interval:  317 QTC Calculation: 421 R Axis:   -3  Text Interpretation: Sinus tachycardia Low  voltage, precordial leads Confirmed by Gilda Crease ([id number]) on 09/24/2022 4:18:28 AM  Radiology DG Chest Port 1 View  Result Date: 09/24/2022 CLINICAL DATA:  Sepsis EXAM: PORTABLE CHEST 1 VIEW COMPARISON:  02/09/2013 FINDINGS: The heart  size and mediastinal contours are within normal limits. Both lungs are clear. The visualized skeletal structures are unremarkable. IMPRESSION: No active disease. Electronically Signed   By: Helyn Numbers M.D.   On: 09/24/2022 04:03    Procedures Procedures    Medications Ordered in ED Medications  cefTRIAXone (ROCEPHIN) 2 g in sodium chloride 0.9 % 100 mL IVPB (2 g Intravenous New Bag/Given 09/24/22 0352)    ED Course/ Medical Decision Making/ A&P                             Medical Decision Making Amount and/or Complexity of Data Reviewed External Data Reviewed: labs, ECG and notes. Labs: ordered. Decision-making details documented in ED Course. Radiology: ordered and independent interpretation performed. Decision-making details documented in ED Course. ECG/medicine tests: ordered and independent interpretation performed. Decision-making details documented in ED Course.   Patient with erythema and warmth, mild induration of the medial left thigh.  Area of skin changes is in association with 2 linear ulcerated areas of the thigh.  This is consistent with a cellulitis, likely secondary to skin wound.  The area of erythema, warmth is discrete from the knee, does not involve the surgical site.  If this is a cellulitis, however, it is concerning for the possibility of spread to the hardware.  Patient with leukocytosis, tachycardia, tachypnea at arrival.  She has no fever but this does look very consistent with infection.  She has multiple SIRS criteria, normal lactic acid and normal blood pressure.  No sign of septic shock.  Treat initially with Rocephin.  Discussed with Dr. Charlann Boxer, oncall for [facility] (surgery performed by Dr. [name] at [facility]).   Agrees with treatment for cellulitis.  Patient can be transferred to [facility] on the hospitalist service, consult Dr. [name] in the a.m.        Final Clinical Impression(s) / ED Diagnoses Final diagnoses:  Cellulitis of left lower extremity    Rx / DC Orders ED Discharge Orders     None         Jamir Rone, Canary Brim, MD 09/24/22 [phone]

## 2022-09-24 NOTE — Consult Note (Cosign Needed)
[name] is an 77 y.o. female.    Chief Complaint: left leg erythema s/p total knee replacement  HPI: 77 y/o female with recent left total knee replacement woke up early this morning c/o pain and erythema to left inner leg. Patient had left total knee replacement on 09/20/22 and discharged on 09/22/22. Therapy was progressing well. Denies any recent fevers or chills but felt hot early this morning. Denies any recent falls or injuries. Otherwise doing well  PCP:  Carylon Perches, MD  PMH: Past Medical History:  Diagnosis Date   Arthritis    Bacterial overgrowth syndrome    Positive HBT   Carotid artery occlusion    GERD (gastroesophageal reflux disease)    Heart murmur    mild AS 03/21/20 echo   Hypercholesteremia    Hypertension    TIA (transient ischemic attack)     PSes:  Allergies  Allergen Reactions   Oxycodone Other (See Comments)    "makes me deathly sick"    Medications: Current Facility-Administered Medications  Medication Dose Route Frequency Provider Last Rate Last Admin   acetaminophen (TYLENOL) tablet 650 mg  650 mg Oral Q6H PRN Adefeso, Oladapo, DO       Or   acetaminophen (TYLENOL) suppository 650 mg  650 mg Rectal Q6H PRN Adefeso, Oladapo, DO       amLODipine (NORVASC) tablet 5 mg  5 mg Oral Daily Adefeso, Oladapo, DO       aspirin EC tablet 81 mg  81 mg Oral Daily Adefeso, Oladapo, DO       atorvastatin (LIPITOR) tablet 40 mg  40 mg Oral QHS Adefeso, Oladapo, DO       [START ON 09/25/2022] ceFAZolin (ANCEF) IVPB 2g/100 mL premix  2 g Intravenous Q8H Tat, Onalee Hua, MD       ezetimibe (ZETIA) tablet 10 mg  10 mg Oral Daily Adefeso, Oladapo, DO       HYDROcodone-acetaminophen (NORCO) 7.5-325 MG per tablet 1 tablet  1 tablet Oral Q6H PRN Adefeso, Oladapo, DO       mirabegron ER (MYRBETRIQ) tablet 25 mg  25 mg Oral Daily Tat, David, MD       ondansetron [facility]) tablet 4 mg  4 mg Oral Q6H PRN Adefeso, Oladapo, DO       Or   ondansetron (ZOFRAN) injection 4 mg  4 mg  Intravenous Q6H PRN Adefeso, Oladapo, DO       pantoprazole (PROTONIX) EC tablet 40 mg  40 mg Oral Daily Adefeso, Oladapo, DO        Results for orders placed or performed during the hospital encounter of 09/24/22 (from the past 48 hour(s))  Blood Culture (routine x 2)     Status: None (Preliminary result)   Collection Time: 09/24/22  3:19 AM   Specimen: BLOOD  Result Value Ref Range   Specimen Description BLOOD BLOOD RIGHT ARM    Special Requests      BOTTLES DRAWN AEROBIC AND ANAEROBIC Blood Culture adequate volume   Culture      NO GROWTH < 12 HOURS Performed at [facility], [address] [place], [place] [address]    Report Status PENDING   Blood Culture (routine x 2)     Status: None (Preliminary result)   Collection Time: 09/24/22  3:24 AM   Specimen: BLOOD  Result Value Ref Range   Specimen Description BLOOD BLOOD LEFT WRIST    Special Requests      BOTTLES DRAWN AEROBIC AND ANAEROBIC  Blood Culture adequate volume   Culture      NO GROWTH < 12 HOURS Performed at [facility], [address] [place], [place] [address]    Report Status PENDING   Lactic acid, plasma     Status: None   Collection Time: 09/24/22  3:35 AM  Result Value Ref Range   Lactic Acid, Venous 1.2 0.5 - 1.9 mmol/L    Comment: Performed at [facility], [address] [place], [place] [address]  Comprehensive metabolic panel     Status: Abnormal   Collection Time: 09/24/22  3:35 AM  Result Value Ref Range   Sodium 139 135 - 145 mmol/L   Potassium 3.5 3.5 - 5.1 mmol/L   Chloride 103 98 - 111 mmol/L   CO2 26 22 - 32 mmol/L   Glucose, Bld 129 (H) 70 - 99 mg/dL    Comment: Glucose reference range applies only to samples taken after fasting for at least 8 hours.   BUN 20 8 - 23 mg/dL   Creatinine, Ser 8.29 0.44 - 1.00 mg/dL   Calcium 8.7 (L) 8.9 - 10.3 mg/dL   Total Protein 7.0 6.5 - 8.1 g/dL   Albumin 3.2 (L) 3.5 - 5.0 g/dL   AST 86 (H) 15 - 41 U/L   ALT 95 (H) 0 - 44 U/L   Alkaline Phosphatase  138 (H) 38 - 126 U/L   Total Bilirubin 0.9 0.3 - 1.2 mg/dL   GFR, Estimated >56 >21 mL/min    Comment: (NOTE) Calculated using the CKD-EPI Creatinine Equation (2021)    Anion gap 10 5 - 15    Comment: Performed at [facility], [address] [place], [place] [address]  CBC with Differential     Status: Abnormal   Collection Time: 09/24/22  3:35 AM  Result Value Ref Range   WBC 11.9 (H) 4.0 - 10.5 K/uL   RBC 3.53 (L) 3.87 - 5.11 MIL/uL   Hemoglobin 11.1 (L) 12.0 - 15.0 g/dL   HCT 78.4 (L) 69.6 - 29.5 %   MCV 93.8 80.0 - 100.0 fL   MCH 31.4 26.0 - 34.0 pg   MCHC 33.5 30.0 - 36.0 g/dL   RDW 28.4 13.2 - 44.0 %   Platelets 242 150 - 400 K/uL   nRBC 0.0 0.0 - 0.2 %   Neutrophils Relative % 63 %   Neutro Abs 7.5 1.7 - 7.7 K/uL   Lymphocytes Relative 25 %   Lymphs Abs 3.0 0.7 - 4.0 K/uL   Monocytes Relative 6 %   Monocytes Absolute 0.7 0.1 - 1.0 K/uL   Eosinophils Relative 4 %   Eosinophils Absolute 0.5 0.0 - 0.5 K/uL   Basophils Relative 1 %   Basophils Absolute 0.1 0.0 - 0.1 K/uL   Immature Granulocytes 1 %   Abs Immature Granulocytes 0.07 0.00 - 0.07 K/uL    Comment: Performed at [facility], [address], [place], [place] [address]  Protime-INR     Status: None   Collection Time: 09/24/22  3:35 AM  Result Value Ref Range   Prothrombin Time 13.0 11.4 - 15.2 seconds   INR 1.0 0.8 - 1.2    Comment: (NOTE) INR goal varies based on device and disease states. Performed at [facility], [address], [place], [place] [address]   APTT     Status: Abnormal   Collection Time: 09/24/22  3:35 AM  Result Value Ref Range   aPTT 23 (L) 24 - 36 seconds  Comment: Performed at [facility], [address] [place], [place] [address]  Urinalysis, w/ Reflex to Culture (Infection Suspected) -Urine, Clean Catch     Status: Abnormal   Collection Time: 09/24/22  3:40 AM  Result Value Ref Range   Specimen Source URINE, CLEAN CATCH    Color, Urine YELLOW YELLOW   APPearance HAZY (A) CLEAR    Specific Gravity, Urine 1.029 1.005 - 1.030   pH 5.0 5.0 - 8.0   Glucose, UA NEGATIVE NEGATIVE mg/dL   Hgb urine dipstick NEGATIVE NEGATIVE   Bilirubin Urine NEGATIVE NEGATIVE   Ketones, ur NEGATIVE NEGATIVE mg/dL   Protein, ur 30 (A) NEGATIVE mg/dL   Nitrite NEGATIVE NEGATIVE   Leukocytes,Ua NEGATIVE NEGATIVE   RBC / HPF 0-5 0 - 5 RBC/hpf   WBC, UA 0-5 0 - 5 WBC/hpf    Comment:        Reflex urine culture not performed if WBC <=10, OR if Squamous epithelial cells >5. If Squamous epithelial cells >5 suggest recollection.    Bacteria, UA NONE SEEN NONE SEEN   Squamous Epithelial / HPF 0-5 0 - 5 /HPF   Mucus PRESENT     Comment: Performed at [facility], [address] [place], [place] [address]  Lactic acid, plasma     Status: None   Collection Time: 09/24/22  5:27 AM  Result Value Ref Range   Lactic Acid, Venous 1.0 0.5 - 1.9 mmol/L    Comment: Performed at [facility], [address], [place], [place] [address]   DG Chest Port 1 View  Result Date: 09/24/2022 CLINICAL DATA:  Sepsis EXAM: PORTABLE CHEST 1 VIEW COMPARISON:  02/09/2013 FINDINGS: The heart size and mediastinal contours are within normal limits. Both lungs are clear. The visualized skeletal structures are unremarkable. IMPRESSION: No active disease. Electronically Signed   By: Helyn Numbers M.D.   On: 09/24/2022 04:03    ROS: Increased erythema to left medial thigh  Physical Exam: Alert and appropriate 77 y/o female in no acute distress Left lower leg: moderate erythema to left inner thigh with early ecchymosis from tourniquet during surgery Left anterior knee with well healing incision  Good rom of left knee s/p total knee replacement  Nv intact distally No signs of infection or dvt distally   Assessment/Plan Assessment: left leg cellulitis s/p recent total knee replacement  Plan: Recommend continued IV antibiotics while here in the hospital and transition to oral antibiotics prior to discharge Expect  increased ecchymosis to the thigh due to the tourniquet over the next few days Plan to keep her on oral antibiotics for 6 weeks She has an appointment with me next week to keep a close eye on the knee and thigh Continue PT/OT here and as an outpatient Pain management as needed Will continue to monitor her progress  Alphonsa Overall PA-C, MPAS [facility] is now [facility] [employer] [facility] [address], [place], [place] [address] Phone: [phone] [email or web address]  [employer], broadcasting/film/video     Patient reports that the ice packs caused the skin break which led to the cellulitis. I agree with the above assessment and plan.  Beverely Low MD [facility]

## 2022-09-24 NOTE — Evaluation (Signed)
Physical Therapy Evaluation Patient Details Name: [name] MRN: [id number] DOB: 07-17-1945 Today's Date: 09/24/2022  History of Present Illness  77 yo female admitted with L thigh cellulitis. Recent L TKA 09/20/22. Hx of RTC repair  Clinical Impression  On eval, pt was Supv level for mobility. She walked ~125 feet with a RW. Minimal pain. Performed knee ROM exercises. Recommend pt resume OP PT once able.         Assistance Recommended at Discharge Intermittent Supervision/Assistance  If plan is discharge home, recommend the following:  Can travel by private vehicle  A little help with walking and/or transfers;A little help with bathing/dressing/bathroom;Assistance with cooking/housework;Assist for transportation;Help with stairs or ramp for entrance        Equipment Recommendations None recommended by PT  Recommendations for Other Services       Functional Status Assessment Patient has had a recent decline in their functional status and demonstrates the ability to make significant improvements in function in a reasonable and predictable amount of time.     Precautions / Restrictions Precautions Precautions: Fall;Knee Restrictions Weight Bearing Restrictions: No Other Position/Activity Restrictions: WBAT      Mobility  Bed Mobility Overal bed mobility: Needs Assistance Bed Mobility: Supine to Sit, Sit to Supine     Supine to sit: Modified independent (Device/Increase time) Sit to supine: Modified independent (Device/Increase time)   General bed mobility comments: increased time    Transfers Overall transfer level: Needs assistance Equipment used: Rolling walker (2 wheels) Transfers: Sit to/from Stand Sit to Stand: Supervision           General transfer comment: Supv for safety    Ambulation/Gait Ambulation/Gait assistance: Supervision Gait Distance (Feet): 125 Feet Assistive device: Rolling walker (2 wheels) Gait Pattern/deviations: Step-through  pattern, Decreased stride length       General Gait Details: Supv for safety. No LOB with Rw use.  Stairs            Wheelchair Mobility     Tilt Bed    Modified Rankin (Stroke Patients Only)       Balance Overall balance assessment: Mild deficits observed, not formally tested                                           Pertinent Vitals/Pain Pain Assessment Pain Assessment: Faces Faces Pain Scale: Hurts a little bit Pain Location: L thigh Pain Descriptors / Indicators: Discomfort, Sore Pain Intervention(s): Monitored during session, Ice applied    Home Living Family/patient expects to be discharged to:: Private residence Living Arrangements: Alone Available Help at Discharge: Family Type of Home: House Home Access: Level entry     Alternate Level Stairs-Number of Steps: 2 steps to access den Home Layout: One level;Multi-level Home Equipment: Agricultural consultant (2 wheels);Rollator (4 wheels);Tub bench;Toilet riser      Prior Function Prior Level of Function : Independent/Modified Independent             Mobility Comments: using RW. going to OP PT       Hand Dominance   Dominant Hand: Right    Extremity/Trunk Assessment   Upper Extremity Assessment Upper Extremity Assessment: Overall WFL for tasks assessed    Lower Extremity Assessment Lower Extremity Assessment: Generalized weakness (thigh erythema)    Cervical / Trunk Assessment Cervical / Trunk Assessment: Normal  Communication   Communication: No difficulties  Cognition  Behavior During Therapy: WFL for tasks assessed/performed, Anxious Overall Cognitive Status: Within Functional Limits for tasks assessed                                 General Comments: mild anxiety        General Comments      Exercises Total Joint Exercises Ankle Circles/Pumps: AROM, Both, 10 reps Quad Sets: AROM, Left, 10 reps Hip ABduction/ADduction: AROM, Left, 10  reps Straight Leg Raises: AROM, Left, 10 reps Knee Flexion: AROM, 20 reps, Seated Goniometric ROM: ~5-75 degrees   Assessment/Plan    PT Assessment Patient needs continued PT services  PT Problem List Decreased strength;Decreased range of motion;Decreased activity tolerance;Decreased balance;Decreased mobility;Decreased knowledge of use of DME;Pain       PT Treatment Interventions DME instruction;Gait training;Stair training;Functional mobility training;Therapeutic activities;Therapeutic exercise;Balance training;Neuromuscular re-education;Patient/family education;Modalities    PT Goals (Current goals can be found in the Care Plan section)  Acute Rehab PT Goals Patient Stated Goal: resume OPPT and regain independence PT Goal Formulation: With patient Time For Goal Achievement: 10/08/22 Potential to Achieve Goals: Good    Frequency 7X/week     Co-evaluation               AM-PAC PT "6 Clicks" Mobility  Outcome Measure Help needed turning from your back to your side while in a flat bed without using bedrails?: None Help needed moving from lying on your back to sitting on the side of a flat bed without using bedrails?: None Help needed moving to and from a bed to a chair (including a wheelchair)?: A Little Help needed standing up from a chair using your arms (e.g., wheelchair or bedside chair)?: A Little Help needed to walk in hospital room?: A Little Help needed climbing 3-5 steps with a railing? : A Little 6 Click Score: 20    End of Session Equipment Utilized During Treatment: Gait belt Activity Tolerance: Patient tolerated treatment well Patient left: in bed;with call bell/phone within reach;with family/visitor present   PT Visit Diagnosis: Difficulty in walking, not elsewhere classified (R26.2)    Time: 1610-9604 PT Time Calculation (min) (ACUTE ONLY): 21 min   Charges:   PT Evaluation $PT Eval Low Complexity: 1 Low   PT General Charges $$ ACUTE PT VISIT: 1  Visit            Faye Ramsay, PT Acute Rehabilitation  Office: [phone]

## 2022-09-25 DIAGNOSIS — E782 Mixed hyperlipidemia: Secondary | ICD-10-CM | POA: Diagnosis not present

## 2022-09-25 DIAGNOSIS — L03116 Cellulitis of left lower limb: Secondary | ICD-10-CM | POA: Diagnosis not present

## 2022-09-25 DIAGNOSIS — L0390 Cellulitis, unspecified: Secondary | ICD-10-CM | POA: Diagnosis present

## 2022-09-25 DIAGNOSIS — M1712 Unilateral primary osteoarthritis, left knee: Secondary | ICD-10-CM | POA: Diagnosis not present

## 2022-09-25 LAB — COMPREHENSIVE METABOLIC PANEL
ALT: 62 U/L — ABNORMAL HIGH (ref 0–44)
AST: 36 U/L (ref 15–41)
Albumin: 2.7 g/dL — ABNORMAL LOW (ref 3.5–5.0)
Alkaline Phosphatase: 108 U/L (ref 38–126)
Anion gap: 8 (ref 5–15)
BUN: 12 mg/dL (ref 8–23)
CO2: 25 mmol/L (ref 22–32)
Calcium: 8.4 mg/dL — ABNORMAL LOW (ref 8.9–10.3)
Chloride: 106 mmol/L (ref 98–111)
Creatinine, Ser: 0.6 mg/dL (ref 0.44–1.00)
GFR, Estimated: >60 mL/min (ref >60)
Glucose, Bld: 111 mg/dL — ABNORMAL HIGH (ref 70–99)
Potassium: 3.4 mmol/L — ABNORMAL LOW (ref 3.5–5.1)
Sodium: 139 mmol/L (ref 135–145)
Total Bilirubin: 1 mg/dL (ref 0.3–1.2)
Total Protein: 5.5 g/dL — ABNORMAL LOW (ref 6.5–8.1)

## 2022-09-25 LAB — CBC
HCT: 30.3 % — ABNORMAL LOW (ref 36.0–46.0)
Hemoglobin: 9.4 g/dL — ABNORMAL LOW (ref 12.0–15.0)
MCH: 30 pg (ref 26.0–34.0)
MCHC: 31 g/dL (ref 30.0–36.0)
MCV: 96.8 fL (ref 80.0–100.0)
Platelets: 216 10*3/uL (ref 150–400)
RBC: 3.13 MIL/uL — ABNORMAL LOW (ref 3.87–5.11)
RDW: 12.2 % (ref 11.5–15.5)
WBC: 8.8 10*3/uL (ref 4.0–10.5)
nRBC: 0 % (ref 0.0–0.2)

## 2022-09-25 MED ORDER — CEPHALEXIN 500 MG PO CAPS: 500.0000 mg | ORAL_CAPSULE | Freq: Four times a day (QID) | ORAL | 0 refills | Status: AC

## 2022-09-25 MED ORDER — MAGIC MOUTHWASH W/LIDOCAINE
10.0000 mL | Freq: Four times a day (QID) | ORAL | Status: DC
  Administered 2022-09-25 (×2): 10 mL via ORAL
  Filled 2022-09-25 (×4): qty 10

## 2022-09-25 MED ORDER — POTASSIUM CHLORIDE 20 MEQ PO PACK
40.0000 meq | PACK | Freq: Once | ORAL | Status: AC
  Administered 2022-09-25: 40 meq via ORAL
  Filled 2022-09-25: qty 2

## 2022-09-25 MED ORDER — MENTHOL 3 MG MT LOZG
1.0000 | LOZENGE | OROMUCOSAL | Status: DC | PRN
  Administered 2022-09-25: 3 mg via ORAL
  Filled 2022-09-25: qty 9

## 2022-09-25 MED ORDER — MAGIC MOUTHWASH: 10.0000 mL | Freq: Three times a day (TID) | ORAL | Status: DC | PRN

## 2022-09-25 NOTE — Care Management CC44 (Signed)
Condition Code 44 Documentation Completed  Patient Details  Name: PRESCILLA MONGER MRN: [id number] Date of Birth: 10/21/1945   Condition Code 44 given:  Yes Patient signature on Condition Code 44 notice:  Yes Documentation of 2 MD's agreement:  Yes Code 44 added to claim:  Yes    Kenley Troop, LCSW 09/25/2022, 2:13 PM

## 2022-09-25 NOTE — Care Management Obs Status (Signed)
MEDICARE OBSERVATION STATUS NOTIFICATION   Patient Details  Name: [name] MRN: [id number] Date of Birth: February 21, 1946   Medicare Observation Status Notification Given:  Yes    Amada Jupiter, LCSW 09/25/2022, 2:13 PM

## 2022-09-25 NOTE — Plan of Care (Signed)
Discharge instructions given to the patient including medications and follow up.  

## 2022-09-25 NOTE — Progress Notes (Signed)
Physical Therapy Treatment Patient Details Name: [name] MRN: [id number] DOB: 1945-09-27 Today's Date: 09/25/2022   History of Present Illness 77 yo female admitted with L thigh cellulitis. Recent L TKA 09/20/22. Hx of RTC repair    PT Comments  Pt received in bed, daughter at bedside. Discussed current LOF and needs at d/c. Pt did not receive a RW upon recent d/c and has been using a friends which is too high, therefore pediatric size RW to be ordered and delivered to room. Pt performed well, demonstrating Supervision/ModI for all transfers from various surfaces, gait training with RW 150+ ft with Supervision, no LOB. Education provided on proper positioning of L LE for optimal knee ROM, safe car transfers, and reviewed stair negotiation which pt has been completing without difficulty since recent TKA. Pt anxious to d/c home today if cleared by MD. Daughter visiting and will assist with transition home. Pt to continue Out-pt PT upon d/c.    Assistance Recommended at Discharge Intermittent Supervision/Assistance  If plan is discharge home, recommend the following:  Can travel by private vehicle    A little help with walking and/or transfers;A little help with bathing/dressing/bathroom;Assistance with cooking/housework;Assist for transportation;Help with stairs or ramp for entrance      Equipment Recommendations  Rolling walker (2 wheels) (Pediatric size)    Recommendations for Other Services       Precautions / Restrictions Precautions Precautions: Fall;Knee Precaution Booklet Issued: Yes (comment) Precaution Comments:  (previously issued) Restrictions Weight Bearing Restrictions: Yes Other Position/Activity Restrictions: WBAT     Mobility  Bed Mobility Overal bed mobility: Needs Assistance Bed Mobility: Supine to Sit, Sit to Supine     Supine to sit: Modified independent (Device/Increase time) Sit to supine: Modified independent (Device/Increase time)   General bed  mobility comments: increased time    Transfers Overall transfer level: Needs assistance Equipment used: Rolling walker (2 wheels) Transfers: Sit to/from Stand Sit to Stand: Supervision           General transfer comment: Supv for safety and impulsivity    Ambulation/Gait Ambulation/Gait assistance: Supervision Gait Distance (Feet): 150 Feet Assistive device: Rolling walker (2 wheels) Gait Pattern/deviations: Step-through pattern, Decreased stride length       General Gait Details: Supv for safety. No LOB with Rw use.   Stairs             Wheelchair Mobility     Tilt Bed    Modified Rankin (Stroke Patients Only)       Balance Overall balance assessment: Mild deficits observed, not formally tested                                          Cognition Arousal/Alertness: Awake/alert Behavior During Therapy: WFL for tasks assessed/performed, Anxious Overall Cognitive Status: Within Functional Limits for tasks assessed                                 General Comments: mild anxiety        Exercises Total Joint Exercises Ankle Circles/Pumps: AROM, Both, 10 reps Quad Sets: AROM, Left, 10 reps Heel Slides:  (on towel) Long Arc Quad: AROM, Left, 10 reps, Seated Knee Flexion: AROM, 20 reps, Seated Goniometric ROM: ~ 5-80 seated    General Comments General comments (skin integrity, edema, etc.): Medial Left thigh red  and edematous. Educated pt and daughter regarding proper positioning of L LE to facilitate knee flex/ext.      Pertinent Vitals/Pain Pain Assessment Pain Assessment: 0-10 Pain Score: 3  Pain Location: L thigh Pain Descriptors / Indicators: Discomfort, Sore Pain Intervention(s): Limited activity within patient's tolerance, Ice applied    Home Living                          Prior Function            PT Goals (current goals can now be found in the care plan section) Acute Rehab PT  Goals Patient Stated Goal: resume OPPT and regain independence Progress towards PT goals: Progressing toward goals    Frequency    7X/week      PT Plan Current plan remains appropriate    Co-evaluation              AM-PAC PT "6 Clicks" Mobility   Outcome Measure  Help needed turning from your back to your side while in a flat bed without using bedrails?: None Help needed moving from lying on your back to sitting on the side of a flat bed without using bedrails?: None Help needed moving to and from a bed to a chair (including a wheelchair)?: A Little Help needed standing up from a chair using your arms (e.g., wheelchair or bedside chair)?: A Little Help needed to walk in hospital room?: A Little Help needed climbing 3-5 steps with a railing? : A Little 6 Click Score: 20    End of Session Equipment Utilized During Treatment: Gait belt Activity Tolerance: Patient tolerated treatment well Patient left: in chair;with call bell/phone within reach;with family/visitor present Nurse Communication: Mobility status PT Visit Diagnosis: Difficulty in walking, not elsewhere classified (R26.2) Pain - Right/Left: Left Pain - part of body: Knee     Time: 1610-9604 PT Time Calculation (min) (ACUTE ONLY): 32 min  Charges:    $Gait Training: 8-22 mins $Therapeutic Exercise: 8-22 mins PT General Charges $$ ACUTE PT VISIT: 1 Visit                    Zadie Cleverly, PTA  Jannet Askew 09/25/2022, 2:01 PM

## 2022-09-25 NOTE — Progress Notes (Addendum)
Orthopedics Progress Note  Subjective: Patient feeling better today except that the roof of her mouth is raw. Pain controlled  Objective:  Vitals:   09/24/22 2109 09/25/22 0539  BP: 132/61 (!) 145/54  Pulse: 87 83  Resp: 17 17  Temp: 98.6 F (37 C) 98.4 F (36.9 C)  SpO2: 97% 95%    General: Awake and alert  Patient has irritated roof of the mouth. No obvious exudate Musculoskeletal: Left medial thigh erythema improved. Minimal edema. Knee incision healing well with no erythema.  Neg Homans Neurovascularly intact  Lab Results  Component Value Date   WBC 8.8 09/25/2022   HGB 9.4 (L) 09/25/2022   HCT 30.3 (L) 09/25/2022   MCV 96.8 09/25/2022   PLT 216 09/25/2022       Component Value Date/Time   NA 139 09/25/2022 0322   K 3.4 (L) 09/25/2022 0322   CL 106 09/25/2022 0322   CO2 25 09/25/2022 0322   GLUCOSE 111 (H) 09/25/2022 0322   BUN 12 09/25/2022 0322   CREATININE 0.60 09/25/2022 0322   CREATININE 0.69 10/06/2012 1540   CALCIUM 8.4 (L) 09/25/2022 0322   GFRNONAA >60 09/25/2022 0322   GFRAA >60 03/02/2019 0825    Lab Results  Component Value Date   INR 1.0 09/24/2022   INR 0.9 06/13/2021   INR 0.9 03/21/2020    Assessment/Plan: s/p Left total knee replacement with upper thigh cellulitis likely to skin break from the plastic sealer for the ice packs. The cellulitis is responding well to IV Ancef (1st generation cephalosporin preferred for ortho application) as long as clinically improving Will prescribe oral mouthwash and cepacol losenges for her oral complaints but defer to primary care team on that for their recommendation  Discharge home soon on po Keflex for at least two weeks or until infection gone. We will see her back early next week in the office  Viviann Spare R. [name], MD 09/25/2022 7:34 AM

## 2022-09-25 NOTE — Progress Notes (Addendum)
   09/25/22 0946  TOC Brief Assessment  Insurance and Status Reviewed  Patient has primary care physician Yes  Home environment has been reviewed home alone  Prior level of function: modified independent  Prior/Current Home Services No current home services (attending OPPT)  Social Determinants of Health Reivew SDOH reviewed no interventions necessary  Readmission risk has been reviewed Yes  Transition of care needs no transition of care needs at this time     ADDENDUM: Alerted that pt needing youth RW - no DME agency preference - order placed with [employer] for delivery to room today.

## 2022-09-25 NOTE — Discharge Summary (Addendum)
Physician Discharge Summary   Patient: [name] MRN: [id number] DOB: Jul 17, 1945  Admit date:     09/24/2022  Discharge date: 09/25/22  Discharge Physician: Meredeth Ide   PCP: Carylon Perches, MD   Recommendations at discharge:   Follow-up Dr. Devonne Doughty in 1 week  Discharge Diagnoses: Principal Problem:   Cellulitis of left thigh Active Problems:   Mixed hyperlipidemia   GERD without esophagitis   Essential hypertension   Osteoarthritis of left knee   Cellulitis  Resolved Problems:   * No resolved hospital problems. *  Hospital Course:  77 year old female with a history of CVA/TIA, hypertension, hyperlipidemia, GERD, mild aortic stenosis, left CEA 03/27/2020 presented with pain and erythema on her left medial thigh that started on 09/22/2022 evening.  Notably, the patient had a left TKA performed by Dr. [name] on 09/20/2022.  She was discharged home in stable condition in the morning of 09/22/2022.  She was discharged home with aspirin 81 mg twice daily for DVT prophylaxis.  Later that evening, the patient noted some irritation and pain on the left medial thigh.  She felt like there was a foreign body in the thigh, and she manipulated it with her fingers.  In the morning of 09/23/2022, the patient noted increasing edema and erythema on the left medial thigh.  She denies any other recent injuries to the leg.  As result, the patient presented for further evaluation and treatment.  She denies any fevers, chills, chest pain, shortness of breath, nausea, vomiting or diarrhea.  In the ED, the patient was afebrile and hemodynamically stable with oxygen saturation 96% room air.  WBC 11.9, hemoglobin 11.1, platelets 242.  Sodium 139, potassium 3.5, bicarbonate 26, serum creatinine 0.76.  Lactic acid peaked at 1.2.  EKG shows sinus rhythm with nonspecific T wave change.  UA was negative for pyuria.  The patient was given a dose of ceftriaxone.  EDP spoke with [facility] (Dr.Olin) who agreed to consult  once the patient was transferred to [facility].  Dr. Devonne Doughty will see the patient after transfer.  Assessment and Plan:   Nonpurulent left leg cellulitis -Improved with ceftriaxone, switch to cefazolin -Will discharge on Keflex 500 mg p.o. 4 times daily for 2 weeks as per orthopedic recommendation -Patient to follow-up orthopedics in 1 week for wound recheck and that time can discuss regarding continuing with antibiotics or not.  S/p left knee total knee arthroplasty -Postop care per orthopedics -Continue aspirin and Norco as needed  Hypertension -Continue amlodipine  Hyperlipidemia -Continue Zetia, statin  Left carotid artery stenosis history S/p Left CEA 03/27/2020 (Dr. Darrick Penna) US carotid on 02/16/21 showed bilateral 1-39% stenosis Continue ASA   Obesity (BMI 30-39.9) BMI 34.19 Lifestyle modification   Mild hypokalemia -Potassium is 3.4 -Replace potassium with Kdur 40 mEq p.o. x 1       Consultants: Orthopedics Procedures performed:   Disposition: Home Diet recommendation:  Discharge Diet Orders (From admission, onward)     Start     Ordered   09/25/22 0000  Diet - low sodium heart healthy        09/25/22 1402           Regular diet DISCHARGE MEDICATION: Allergies as of 09/25/2022       Reactions   Oxycodone Other (See Comments)   "makes me deathly sick"        Medication List     STOP taking these medications    ibuprofen 800 MG tablet Commonly known as: ADVIL  traMADol 50 MG tablet Commonly known as: ULTRAM       TAKE these medications    acetaminophen 650 MG CR tablet Commonly known as: TYLENOL Take 650 mg by mouth 3 (three) times daily as needed for pain (Knee pain).   amLODipine 5 MG tablet Commonly known as: NORVASC Take 5 mg by mouth daily.   aspirin EC 81 MG tablet Take 1 tablet (81 mg total) by mouth in the morning and at bedtime.   atorvastatin 40 MG tablet Commonly known as: LIPITOR Take 1 tablet (40 mg total) by  mouth at bedtime.   B-complex with vitamin C tablet Take 1 tablet by mouth daily.   cephALEXin 500 MG capsule Commonly known as: KEFLEX Take 1 capsule (500 mg total) by mouth 4 (four) times daily for 14 days.   esomeprazole 20 MG capsule Commonly known as: NEXIUM Take 20 mg by mouth daily as needed (acid reflux/indigestion.).   ezetimibe 10 MG tablet Commonly known as: ZETIA Take 10 mg by mouth daily.   Gemtesa 75 MG Tabs Generic drug: Vibegron Take 75 mg by mouth daily.   HYDROcodone-acetaminophen 7.5-325 MG tablet Commonly known as: NORCO Take 1 tablet by mouth every 6 (six) hours as needed for severe pain.   hydrOXYzine 25 MG tablet Commonly known as: ATARAX TAKE 1 TABLET BY MOUTH EVERY DAY AT BEDTIME FOR 5 DAYS   methocarbamol 500 MG tablet Commonly known as: ROBAXIN Take 1 tablet (500 mg total) by mouth every 8 (eight) hours as needed for muscle spasms.   ondansetron 4 MG tablet Commonly known as: Zofran Take 1 tablet (4 mg total) by mouth every 8 (eight) hours as needed for vomiting, refractory nausea / vomiting or nausea.   Turmeric 500 MG Tabs Take 1,000 mg by mouth daily.               Durable Medical Equipment  (From admission, onward)           Start     Ordered   09/25/22 1339  For home use only DME Walker rolling  Once       Question Answer Comment  Walker: Other   Comments Pediatric   Patient needs a walker to treat with the following condition Left leg cellulitis      09/25/22 1338            Follow-up Information     Beverely Low, MD Follow up in 1 week(s).   Specialty: Orthopedic Surgery Why: For wound re-check Contact information: [address] [place] [address] [place] [address] [phone]                Discharge Exam: Ceasar Mons Weights   09/24/22 0307 09/24/22 0700  Weight: 80.9 kg 79.4 kg   Appears in no acute distress S1-S2, regular, no murmur auscultated Left thigh cellulitis has improved, mild  dusky erythema  Condition at discharge: good  The results of significant diagnostics from this hospitalization (including imaging, microbiology, ancillary and laboratory) are listed below for reference.   Imaging Studies: DG Chest Port 1 View  Result Date: 09/24/2022 CLINICAL DATA:  Sepsis EXAM: PORTABLE CHEST 1 VIEW COMPARISON:  02/09/2013 FINDINGS: The heart size and mediastinal contours are within normal limits. Both lungs are clear. The visualized skeletal structures are unremarkable. IMPRESSION: No active disease. Electronically Signed   By: Helyn Numbers M.D.   On: 09/24/2022 04:03    Microbiology: Results for orders placed or performed during the hospital encounter of 09/24/22  Blood Culture (routine x 2)     Status: None (Preliminary result)   Collection Time: 09/24/22  3:19 AM   Specimen: BLOOD  Result Value Ref Range Status   Specimen Description BLOOD BLOOD RIGHT ARM  Final   Special Requests   Final    BOTTLES DRAWN AEROBIC AND ANAEROBIC Blood Culture adequate volume   Culture   Final    NO GROWTH 1 DAY Performed at [facility], [address] [place], [place] [address]    Report Status PENDING  Incomplete  Blood Culture (routine x 2)     Status: None (Preliminary result)   Collection Time: 09/24/22  3:24 AM   Specimen: BLOOD  Result Value Ref Range Status   Specimen Description BLOOD BLOOD LEFT WRIST  Final   Special Requests   Final    BOTTLES DRAWN AEROBIC AND ANAEROBIC Blood Culture adequate volume   Culture   Final    NO GROWTH 1 DAY Performed at [facility], [address] [place], [place] [address]    Report Status PENDING  Incomplete    Labs: CBC: Recent Labs  Lab 09/24/22 0335 09/25/22 0322  WBC 11.9* 8.8  NEUTROABS 7.5  --   HGB 11.1* 9.4*  HCT 33.1* 30.3*  MCV 93.8 96.8  PLT 242 216   Basic Metabolic Panel: Recent Labs  Lab 09/24/22 0335 09/25/22 0322  NA 139 139  K 3.5 3.4*  CL 103 106  CO2 26 25  GLUCOSE 129* 111*  BUN 20 12   CREATININE 0.76 0.60  CALCIUM 8.7* 8.4*   Liver Function Tests: Recent Labs  Lab 09/24/22 0335 09/25/22 0322  AST 86* 36  ALT 95* 62*  ALKPHOS 138* 108  BILITOT 0.9 1.0  PROT 7.0 5.5*  ALBUMIN 3.2* 2.7*   CBG: No results for input(s): "GLUCAP" in the last 168 hours.  Discharge time spent: greater than 30 minutes.  Signed: Meredeth Ide, MD [facility] 09/25/2022

## 2022-09-27 DIAGNOSIS — M25561 Pain in right knee: Secondary | ICD-10-CM | POA: Diagnosis not present

## 2022-09-27 DIAGNOSIS — M25661 Stiffness of right knee, not elsewhere classified: Secondary | ICD-10-CM | POA: Diagnosis not present

## 2022-09-27 LAB — CULTURE, BLOOD (ROUTINE X 2)

## 2022-09-28 LAB — CULTURE, BLOOD (ROUTINE X 2)
Culture: NO GROWTH
Culture: NO GROWTH

## 2022-09-29 LAB — CULTURE, BLOOD (ROUTINE X 2)
Special Requests: ADEQUATE
Special Requests: ADEQUATE

## 2022-09-30 DIAGNOSIS — M25561 Pain in right knee: Secondary | ICD-10-CM | POA: Diagnosis not present

## 2022-09-30 DIAGNOSIS — M25661 Stiffness of right knee, not elsewhere classified: Secondary | ICD-10-CM | POA: Diagnosis not present

## 2022-10-02 DIAGNOSIS — M25661 Stiffness of right knee, not elsewhere classified: Secondary | ICD-10-CM | POA: Diagnosis not present

## 2022-10-02 DIAGNOSIS — M25561 Pain in right knee: Secondary | ICD-10-CM | POA: Diagnosis not present

## 2022-10-03 DIAGNOSIS — Z4789 Encounter for other orthopedic aftercare: Secondary | ICD-10-CM | POA: Diagnosis not present

## 2022-10-04 DIAGNOSIS — M25561 Pain in right knee: Secondary | ICD-10-CM | POA: Diagnosis not present

## 2022-10-04 DIAGNOSIS — M25661 Stiffness of right knee, not elsewhere classified: Secondary | ICD-10-CM | POA: Diagnosis not present

## 2022-10-07 DIAGNOSIS — M25561 Pain in right knee: Secondary | ICD-10-CM | POA: Diagnosis not present

## 2022-10-07 DIAGNOSIS — M25661 Stiffness of right knee, not elsewhere classified: Secondary | ICD-10-CM | POA: Diagnosis not present

## 2022-10-09 DIAGNOSIS — M25561 Pain in right knee: Secondary | ICD-10-CM | POA: Diagnosis not present

## 2022-10-09 DIAGNOSIS — M25661 Stiffness of right knee, not elsewhere classified: Secondary | ICD-10-CM | POA: Diagnosis not present

## 2022-10-23 ENCOUNTER — Ambulatory Visit: Payer: Medicare Other | Admitting: Urology

## 2022-11-14 DIAGNOSIS — H04123 Dry eye syndrome of bilateral lacrimal glands: Secondary | ICD-10-CM | POA: Diagnosis not present

## 2022-11-14 DIAGNOSIS — Z961 Presence of intraocular lens: Secondary | ICD-10-CM | POA: Diagnosis not present

## 2022-11-22 ENCOUNTER — Ambulatory Visit: Payer: Medicare Other | Admitting: Urology

## 2022-11-22 VITALS — BP 161/78 | HR 118

## 2022-11-22 DIAGNOSIS — R32 Unspecified urinary incontinence: Secondary | ICD-10-CM

## 2022-11-22 DIAGNOSIS — N3281 Overactive bladder: Secondary | ICD-10-CM

## 2022-11-22 DIAGNOSIS — N3941 Urge incontinence: Secondary | ICD-10-CM | POA: Diagnosis not present

## 2022-11-22 LAB — URINALYSIS, ROUTINE W REFLEX MICROSCOPIC
Bilirubin, UA: NEGATIVE
Glucose, UA: NEGATIVE
Ketones, UA: NEGATIVE
Leukocytes,UA: NEGATIVE
Nitrite, UA: NEGATIVE
Protein,UA: NEGATIVE
RBC, UA: NEGATIVE
Specific Gravity, UA: 1.015 (ref 1.005–1.030)
Urobilinogen, Ur: 0.2 mg/dL (ref 0.2–1.0)
pH, UA: 6 (ref 5.0–7.5)

## 2022-11-22 MED ORDER — TROSPIUM CHLORIDE ER 60 MG PO CP24: 1.0000 | ORAL_CAPSULE | Freq: Every day | ORAL | 11 refills | Status: DC

## 2022-11-22 NOTE — Progress Notes (Unsigned)
11/22/2022 12:03 PM   [name] 1945-08-16 [id number]  Referring provider: Carylon Perches, MD [address] [place],  [place] [address]  Followup OAB   HPI: [name] is a 77yo here for followup for OAB. She notes the [place] lost its effectiveness. She has urinary urgency and morning urge incontinence. She has nocturia 2-3x.    PMH: Past Medical History:  Diagnosis Date   Arthritis    Bacterial overgrowth syndrome    Positive HBT   Carotid artery occlusion    GERD (gastroesophageal reflux disease)    Heart murmur    mild AS 03/21/20 echo   Hypercholesteremia    Hypertension    TIA (transient ischemic attack)     Surgical History: Past Surgical History:  Procedure Laterality Date   ABDOMINAL HYSTERECTOMY     BACTERIAL OVERGROWTH TEST N/A 01/07/2013   Procedure: BACTERIAL OVERGROWTH TEST;  Surgeon: Corbin Ade, MD;  [facility];  Service: Endoscopy;  Laterality: N/A;  7:30   Bladder Tack     x 2   CATARACT EXTRACTION W/ INTRAOCULAR LENS IMPLANT Bilateral    COLONOSCOPY  09/2005   Dr. Lovell Sheehan: normal. Due for screening 2017   ENDARTERECTOMY Left 03/27/2020   Procedure: LEFT CAROTID ENDARTERECTOMY;  Surgeon: Sherren Kerns, MD;  Location[facility] OR;  Service: Vascular;  Laterality: Left;   ESOPHAGOGASTRODUODENOSCOPY N/A 10/12/2012   RMR: Small inlet patch.  Small hiatal hernia.  Status post gastric biopsy, negative H.pylori   ESOPHAGOGASTRODUODENOSCOPY (EGD) WITH ESOPHAGEAL DILATION  02/2010   Dr. Jena Gauss: non-critical Schatzki's ring s/p dilation with 67 F dilator, small hiatal hernia, otherwise normal   FRACTURE SURGERY     left wrist   IR ANGIO EXTERNAL CAROTID SEL EXT CAROTID BILAT MOD SED  03/02/2019   IR ANGIO INTRA EXTRACRAN SEL INTERNAL CAROTID BILAT MOD SED  03/02/2019   IR ANGIO VERTEBRAL SEL VERTEBRAL BILAT MOD SED  03/02/2019   IR US GUIDE VASC ACCESS RIGHT  03/02/2019   KNEE ARTHROSCOPY     Bilateral (torn meniscus)   ROTATOR CUFF  REPAIR     TOTAL KNEE ARTHROPLASTY Left 09/20/2022   Procedure: TOTAL KNEE ARTHROPLASTY;  Surgeon: Beverely Low, MD;  Location: [facility] ORS;  Service: Orthopedics;  Laterality: Left;  general and spinal   TUBAL LIGATION      Home Medications:  Allergies as of 11/22/2022       Reactions   Oxycodone Other (See Comments)   "makes me deathly sick"        Medication List        Accurate as of November 22, 2022 12:03 PM. If you have any questions, ask your nurse or doctor.          acetaminophen 650 MG CR tablet Commonly known as: TYLENOL Take 650 mg by mouth 3 (three) times daily as needed for pain (Knee pain).   amLODipine 5 MG tablet Commonly known as: NORVASC Take 5 mg by mouth daily.   atorvastatin 40 MG tablet Commonly known as: LIPITOR Take 1 tablet (40 mg total) by mouth at bedtime.   B-complex with vitamin C tablet Take 1 tablet by mouth daily.   esomeprazole 20 MG capsule Commonly known as: NEXIUM Take 20 mg by mouth daily as needed (acid reflux/indigestion.).   ezetimibe 10 MG tablet Commonly known as: ZETIA Take 10 mg by mouth daily.   Gemtesa 75 MG Tabs Generic drug: Vibegron Take 75 mg by mouth daily.   HYDROcodone-acetaminophen 7.5-325  MG tablet Commonly known as: NORCO Take 1 tablet by mouth every 6 (six) hours as needed for severe pain.   hydrOXYzine 25 MG tablet Commonly known as: ATARAX TAKE 1 TABLET BY MOUTH EVERY DAY AT BEDTIME FOR 5 DAYS   methocarbamol 500 MG tablet Commonly known as: ROBAXIN Take 1 tablet (500 mg total) by mouth every 8 (eight) hours as needed for muscle spasms.   ondansetron 4 MG tablet Commonly known as: Zofran Take 1 tablet (4 mg total) by mouth every 8 (eight) hours as needed for vomiting, refractory nausea / vomiting or nausea.   Turmeric 500 MG Tabs Take 1,000 mg by mouth daily.        Allergies:  Allergies  Allergen Reactions   Oxycodone Other (See Comments)    "makes me deathly sick"    Family  History: Family History  Problem Relation Age of Onset   Colon cancer Neg Hx     Social History:  reports that she has never smoked. She has never used smokeless tobacco. She reports that she does not drink alcohol and does not use drugs.  ROS: All other review of systems were reviewed and are negative except what is noted above in HPI  Physical Exam: BP (!) 161/78   Pulse (!) 118   Constitutional:  Alert and oriented, No acute distress. HEENT: [place] AT, moist mucus membranes.  Trachea midline, no masses. Cardiovascular: No clubbing, cyanosis, or edema. Respiratory: Normal respiratory effort, no increased work of breathing. GI: Abdomen is soft, nontender, nondistended, no abdominal masses GU: No CVA tenderness.  Lymph: No cervical or inguinal lymphadenopathy. Skin: No rashes, bruises or suspicious lesions. Neurologic: Grossly intact, no focal deficits, moving all 4 extremities. Psychiatric: Normal mood and affect.  Laboratory Data: Lab Results  Component Value Date   WBC 8.8 09/25/2022   HGB 9.4 (L) 09/25/2022   HCT 30.3 (L) 09/25/2022   MCV 96.8 09/25/2022   PLT 216 09/25/2022    Lab Results  Component Value Date   CREATININE 0.60 09/25/2022    No results found for: "PSA"  No results found for: "TESTOSTERONE"  Lab Results  Component Value Date   HGBA1C 5.4 06/14/2021    Urinalysis    Component Value Date/Time   COLORURINE YELLOW 09/24/2022 0340   APPEARANCEUR HAZY (A) 09/24/2022 0340   APPEARANCEUR Clear 10/22/2021 1607   LABSPEC 1.029 09/24/2022 0340   PHURINE 5.0 09/24/2022 0340   GLUCOSEU NEGATIVE 09/24/2022 0340   HGBUR NEGATIVE 09/24/2022 0340   BILIRUBINUR NEGATIVE 09/24/2022 0340   BILIRUBINUR Negative 10/22/2021 1607   KETONESUR NEGATIVE 09/24/2022 0340   PROTEINUR 30 (A) 09/24/2022 0340   NITRITE NEGATIVE 09/24/2022 0340   LEUKOCYTESUR NEGATIVE 09/24/2022 0340    Lab Results  Component Value Date   LABMICR See below: 10/22/2021   WBCUA 0-5  10/22/2021   LABEPIT 0-10 10/22/2021   BACTERIA NONE SEEN 09/24/2022    Pertinent Imaging: *** No results found for this or any previous visit.  No results found for this or any previous visit.  No results found for this or any previous visit.  No results found for this or any previous visit.  No results found for this or any previous visit.  No valid procedures specified. No results found for this or any previous visit.  No results found for this or any previous visit.   Assessment & Plan:    1. OAB (overactive bladder) -We will trial [place] 60mg  daily - Urinalysis, Routine w reflex microscopic  2. Urinary incontinence, unspecified type ***   No follow-ups on file.  Wilkie Aye, MD  [facility] [place]

## 2022-11-26 ENCOUNTER — Encounter: Payer: Self-pay | Admitting: Urology

## 2022-11-26 NOTE — Patient Instructions (Signed)

## 2022-12-02 ENCOUNTER — Other Ambulatory Visit (HOSPITAL_COMMUNITY): Payer: Self-pay | Admitting: Internal Medicine

## 2022-12-02 DIAGNOSIS — Z1231 Encounter for screening mammogram for malignant neoplasm of breast: Secondary | ICD-10-CM

## 2022-12-04 ENCOUNTER — Ambulatory Visit (HOSPITAL_COMMUNITY)
Admission: RE | Admit: 2022-12-04 | Discharge: 2022-12-04 | Disposition: A | Discharge status: Home / Self Care | Payer: Medicare Other | Source: Ambulatory Visit | Attending: Internal Medicine | Admitting: Internal Medicine

## 2022-12-04 DIAGNOSIS — Z1231 Encounter for screening mammogram for malignant neoplasm of breast: Secondary | ICD-10-CM

## 2022-12-16 ENCOUNTER — Other Ambulatory Visit (HOSPITAL_COMMUNITY): Payer: Medicare Other

## 2023-01-01 DIAGNOSIS — M79604 Pain in right leg: Secondary | ICD-10-CM | POA: Diagnosis not present

## 2023-01-09 ENCOUNTER — Telehealth: Payer: Self-pay | Admitting: Urology

## 2023-01-09 DIAGNOSIS — N3000 Acute cystitis without hematuria: Secondary | ICD-10-CM | POA: Diagnosis not present

## 2023-01-09 NOTE — Telephone Encounter (Signed)
Patient called with complaint of back pain, frequent urination for several day.   I offered her appt with Maralyn Sago for Nov 8,2024 patient refused.

## 2023-02-04 DIAGNOSIS — M1711 Unilateral primary osteoarthritis, right knee: Secondary | ICD-10-CM | POA: Diagnosis not present

## 2023-02-12 DIAGNOSIS — N390 Urinary tract infection, site not specified: Secondary | ICD-10-CM | POA: Diagnosis not present

## 2023-02-12 DIAGNOSIS — N10 Acute tubulo-interstitial nephritis: Secondary | ICD-10-CM | POA: Diagnosis not present

## 2023-02-19 ENCOUNTER — Encounter: Payer: Self-pay | Admitting: Urology

## 2023-02-19 ENCOUNTER — Ambulatory Visit: Payer: Medicare Other | Admitting: Urology

## 2023-02-19 VITALS — BP 155/75 | HR 101

## 2023-02-19 DIAGNOSIS — N3281 Overactive bladder: Secondary | ICD-10-CM

## 2023-02-19 LAB — URINALYSIS, ROUTINE W REFLEX MICROSCOPIC
Bilirubin, UA: NEGATIVE
Glucose, UA: NEGATIVE
Ketones, UA: NEGATIVE
Leukocytes,UA: NEGATIVE
Nitrite, UA: NEGATIVE
Protein,UA: NEGATIVE
RBC, UA: NEGATIVE
Specific Gravity, UA: 1.01 (ref 1.005–1.030)
Urobilinogen, Ur: 0.2 mg/dL (ref 0.2–1.0)
pH, UA: 6 (ref 5.0–7.5)

## 2023-02-19 NOTE — Patient Instructions (Signed)

## 2023-02-19 NOTE — Progress Notes (Signed)
02/19/2023 2:01 PM   [name] 01/20/1946 [id number]  Referring provider: Carylon Perches, MD [address] [place],  [place] [address]  Followup OAB   HPI: [name] is a 77yo here for followup for OAB. [name] has failed to improve her urinary urgency and urinary incontinence. She has previously tried gemtesa and mirabegron. She has also tried gemtesa without improvement in her incontinence. NO straining to urinate. No hematuria. She was diagnosed with 2 UTIs in the past month and just completed antibiotics this week. UA normal.    PMH: Past Medical History:  Diagnosis Date   Arthritis    Bacterial overgrowth syndrome    Positive HBT   Carotid artery occlusion    GERD (gastroesophageal reflux disease)    Heart murmur    mild AS 03/21/20 echo   Hypercholesteremia    Hypertension    TIA (transient ischemic attack)     Surgical History: Past Surgical History:  Procedure Laterality Date   ABDOMINAL HYSTERECTOMY     BACTERIAL OVERGROWTH TEST N/A 01/07/2013   Procedure: BACTERIAL OVERGROWTH TEST;  Surgeon: Corbin Ade, MD;  [facility];  Service: Endoscopy;  Laterality: N/A;  7:30   Bladder Tack     x 2   CATARACT EXTRACTION W/ INTRAOCULAR LENS IMPLANT Bilateral    COLONOSCOPY  09/2005   Dr. Lovell Sheehan: normal. Due for screening 2017   ENDARTERECTOMY Left 03/27/2020   Procedure: LEFT CAROTID ENDARTERECTOMY;  Surgeon: Sherren Kerns, MD;  Location: [facility] OR;  Service: Vascular;  Laterality: Left;   ESOPHAGOGASTRODUODENOSCOPY N/A 10/12/2012   RMR: Small inlet patch.  Small hiatal hernia.  Status post gastric biopsy, negative H.pylori   ESOPHAGOGASTRODUODENOSCOPY (EGD) WITH ESOPHAGEAL DILATION  02/2010   Dr. Jena Gauss: non-critical Schatzki's ring s/p dilation with 52 F dilator, small hiatal hernia, otherwise normal   FRACTURE SURGERY     left wrist   IR ANGIO EXTERNAL CAROTID SEL EXT CAROTID BILAT MOD SED  03/02/2019   IR ANGIO INTRA EXTRACRAN SEL INTERNAL  CAROTID BILAT MOD SED  03/02/2019   IR ANGIO VERTEBRAL SEL VERTEBRAL BILAT MOD SED  03/02/2019   IR US GUIDE VASC ACCESS RIGHT  03/02/2019   KNEE ARTHROSCOPY     Bilateral (torn meniscus)   ROTATOR CUFF REPAIR     TOTAL KNEE ARTHROPLASTY Left 09/20/2022   Procedure: TOTAL KNEE ARTHROPLASTY;  Surgeon: Beverely Low, MD;  Location: [facility];  Service: Orthopedics;  Laterality: Left;  general and spinal   TUBAL LIGATION      Home Medications:  Allergies as of 02/19/2023       Reactions   Oxycodone Other (See Comments)   "makes me deathly sick"        Medication List        Accurate as of February 19, 2023  2:01 PM. If you have any questions, ask your nurse or doctor.          STOP taking these medications    Gemtesa 75 MG Tabs Generic drug: Vibegron   HYDROcodone-acetaminophen 7.5-325 MG tablet Commonly known as: NORCO   hydrOXYzine 25 MG tablet Commonly known as: ATARAX   ondansetron 4 MG tablet Commonly known as: Zofran       TAKE these medications    acetaminophen 650 MG CR tablet Commonly known as: TYLENOL Take 650 mg by mouth 3 (three) times daily as needed for pain (Knee pain).   amLODipine 5 MG tablet Commonly known as: NORVASC Take 5 mg by  mouth daily.   atorvastatin 40 MG tablet Commonly known as: LIPITOR Take 1 tablet (40 mg total) by mouth at bedtime.   B-complex with vitamin C tablet Take 1 tablet by mouth daily.   esomeprazole 20 MG capsule Commonly known as: NEXIUM Take 20 mg by mouth daily as needed (acid reflux/indigestion.).   ezetimibe 10 MG tablet Commonly known as: ZETIA Take 10 mg by mouth daily.   methocarbamol 500 MG tablet Commonly known as: ROBAXIN Take 1 tablet (500 mg total) by mouth every 8 (eight) hours as needed for muscle spasms.   Trospium Chloride 60 MG Cp24 Take 1 capsule (60 mg total) by mouth daily.   Turmeric 500 MG Tabs Take 1,000 mg by mouth daily.        Allergies:  Allergies  Allergen Reactions    Oxycodone Other (See Comments)    "makes me deathly sick"    Family History: Family History  Problem Relation Age of Onset   Colon cancer Neg Hx     Social History:  reports that she has never smoked. She has never used smokeless tobacco. She reports that she does not drink alcohol and does not use drugs.  ROS: All other review of systems were reviewed and are negative except what is noted above in HPI  Physical Exam: BP (!) 155/75   Pulse (!) 101   Constitutional:  Alert and oriented, No acute distress. HEENT: [place] AT, moist mucus membranes.  Trachea midline, no masses. Cardiovascular: No clubbing, cyanosis, or edema. Respiratory: Normal respiratory effort, no increased work of breathing. GI: Abdomen is soft, nontender, nondistended, no abdominal masses GU: No CVA tenderness.  Lymph: No cervical or inguinal lymphadenopathy. Skin: No rashes, bruises or suspicious lesions. Neurologic: Grossly intact, no focal deficits, moving all 4 extremities. Psychiatric: Normal mood and affect.  Laboratory Data: Lab Results  Component Value Date   WBC 8.8 09/25/2022   HGB 9.4 (L) 09/25/2022   HCT 30.3 (L) 09/25/2022   MCV 96.8 09/25/2022   PLT 216 09/25/2022    Lab Results  Component Value Date   CREATININE 0.60 09/25/2022    No results found for: "PSA"  No results found for: "TESTOSTERONE"  Lab Results  Component Value Date   HGBA1C 5.4 06/14/2021    Urinalysis    Component Value Date/Time   COLORURINE YELLOW 09/24/2022 0340   APPEARANCEUR Clear 11/22/2022 1151   LABSPEC 1.029 09/24/2022 0340   PHURINE 5.0 09/24/2022 0340   GLUCOSEU Negative 11/22/2022 1151   HGBUR NEGATIVE 09/24/2022 0340   BILIRUBINUR Negative 11/22/2022 1151   KETONESUR NEGATIVE 09/24/2022 0340   PROTEINUR Negative 11/22/2022 1151   PROTEINUR 30 (A) 09/24/2022 0340   NITRITE Negative 11/22/2022 1151   NITRITE NEGATIVE 09/24/2022 0340   LEUKOCYTESUR Negative 11/22/2022 1151   LEUKOCYTESUR  NEGATIVE 09/24/2022 0340    Lab Results  Component Value Date   LABMICR Comment 11/22/2022   WBCUA 0-5 10/22/2021   LABEPIT 0-10 10/22/2021   BACTERIA NONE SEEN 09/24/2022    Pertinent Imaging:  No results found for this or any previous visit.  No results found for this or any previous visit.  No results found for this or any previous visit.  No results found for this or any previous visit.  No results found for this or any previous visit.  No valid procedures specified. No results found for this or any previous visit.  No results found for this or any previous visit.   Assessment & Plan:  1. OAB (overactive bladder) Patient defers therapy at this time - Urinalysis, Routine w reflex microscopic   No follow-ups on file.  Wilkie Aye, MD  [facility] [place]

## 2023-02-25 ENCOUNTER — Telehealth: Payer: Self-pay

## 2023-02-25 ENCOUNTER — Ambulatory Visit (INDEPENDENT_AMBULATORY_CARE_PROVIDER_SITE_OTHER): Payer: Medicare Other

## 2023-02-25 DIAGNOSIS — N3281 Overactive bladder: Secondary | ICD-10-CM

## 2023-02-25 NOTE — Telephone Encounter (Signed)
Patient wanted to know what her daily recommended fluid intake should be with her OAB.  She wants to be sure she is drinking enough so she is not dehydrated.  Please advise.

## 2023-02-25 NOTE — Patient Instructions (Signed)

## 2023-02-25 NOTE — Progress Notes (Signed)
PTNS  Session # 1 of 12  Patient Goals: reduced urgency, reduced frequency, no accidents  Health & Social Factors: none Caffeine: 4 a day Alcohol: 0 Daytime voids #per day: 12 a day Night-time voids #per night: 3 Urgency: strong Incontinence Episodes #per day: n/a Treatment Plan/Comments: n/a  Ankle used: right Treatment Setting: 6 Feeling/ Response: sensory   Performed By: Guss Bunde, CMA  Follow Up: as scheduled.

## 2023-02-27 ENCOUNTER — Telehealth: Payer: Self-pay | Admitting: Internal Medicine

## 2023-02-27 DIAGNOSIS — Z96652 Presence of left artificial knee joint: Secondary | ICD-10-CM | POA: Diagnosis not present

## 2023-02-27 DIAGNOSIS — Z471 Aftercare following joint replacement surgery: Secondary | ICD-10-CM | POA: Diagnosis not present

## 2023-02-27 DIAGNOSIS — M1711 Unilateral primary osteoarthritis, right knee: Secondary | ICD-10-CM | POA: Diagnosis not present

## 2023-02-27 NOTE — Telephone Encounter (Signed)
   Pre-operative Risk Assessment    Patient Name: [name]  DOB: Jun 03, 1945 MRN: [id number]     Request for Surgical Clearance    Procedure:  Right total knee arthroplasty   Date of Surgery:  Clearance TBD                                 Surgeon:  Dr. Mikael Spray  Surgeon's Group or Practice Name:  [facility]  Phone number:  [phone] Fax number:  [phone]   Type of Clearance Requested:   - Medical    Type of Anesthesia:  General    Additional requests/questions:    SignedVernard Gambles   02/27/2023, 5:04 PM

## 2023-02-27 NOTE — Telephone Encounter (Signed)
I left a message for the patient to call our office about pre-op clearance update.

## 2023-02-27 NOTE — Telephone Encounter (Signed)
   Name: [name]  DOB: [birth year]  MRN: [id number]  Primary Cardiologist: Parke Poisson, MD   Preoperative team, please contact this patient and set up a phone call appointment for further preoperative risk assessment. Please obtain consent and complete medication review. Thank you for your help.  I confirm that guidance regarding antiplatelet and oral anticoagulation therapy has been completed and, if necessary, noted below.  I also confirmed the patient resides in the state of [place]. As per [facility] Medical Board telemedicine laws, the patient must reside in the state in which the provider is licensed.   Napoleon Form, Leodis Rains, NP 02/27/2023, 5:23 PM [facility]

## 2023-02-27 NOTE — Telephone Encounter (Signed)
Patient stopped in today after leaving [facility] saying she was waiting to get clearance from office so she can have knee surgery. Says the request came from her PCP Dr Ouida Sills and it should've been received about 3 weeks ago and she hasn't heard an update.  Waiting to have surgery scheduled with Dr. [name] with [facility].

## 2023-03-03 ENCOUNTER — Telehealth: Payer: Self-pay | Admitting: *Deleted

## 2023-03-03 NOTE — Telephone Encounter (Signed)
Made pre op tele appt. Med rec and consent done. Will remove from pre op pool.    Patient Consent for Virtual Visit         [name] has provided verbal consent on 03/03/2023 for a virtual visit (video or telephone).   CONSENT FOR VIRTUAL VISIT FOR:  [name]  By participating in this virtual visit I agree to the following:  I hereby voluntarily request, consent and authorize [facility] and its employed or contracted physicians, physician assistants, nurse practitioners or other licensed health care professionals (the Practitioner), to provide me with telemedicine health care services (the "Services") as deemed necessary by the treating Practitioner. I acknowledge and consent to receive the Services by the Practitioner via telemedicine. I understand that the telemedicine visit will involve communicating with the Practitioner through live audiovisual communication technology and the disclosure of certain medical information by electronic transmission. I acknowledge that I have been given the opportunity to request an in-person assessment or other available alternative prior to the telemedicine visit and am voluntarily participating in the telemedicine visit.  I understand that I have the right to withhold or withdraw my consent to the use of telemedicine in the course of my care at any time, without affecting my right to future care or treatment, and that the Practitioner or I may terminate the telemedicine visit at any time. I understand that I have the right to inspect all information obtained and/or recorded in the course of the telemedicine visit and may receive copies of available information for a reasonable fee.  I understand that some of the potential risks of receiving the Services via telemedicine include:  Delay or interruption in medical evaluation due to technological equipment failure or disruption; Information transmitted may not be sufficient (e.g. poor resolution of  images) to allow for appropriate medical decision making by the Practitioner; and/or  In rare instances, security protocols could fail, causing a breach of personal health information.  Furthermore, I acknowledge that it is my responsibility to provide information about my medical history, conditions and care that is complete and accurate to the best of my ability. I acknowledge that Practitioner's advice, recommendations, and/or decision may be based on factors not within their control, such as incomplete or inaccurate data provided by me or distortions of diagnostic images or specimens that may result from electronic transmissions. I understand that the practice of medicine is not an exact science and that Practitioner makes no warranties or guarantees regarding treatment outcomes. I acknowledge that a copy of this consent can be made available to me via my patient portal [facility] MyChart), or I can request a printed copy by calling the office of [facility].    I understand that my insurance will be billed for this visit.   I have read or had this consent read to me. I understand the contents of this consent, which adequately explains the benefits and risks of the Services being provided via telemedicine.  I have been provided ample opportunity to ask questions regarding this consent and the Services and have had my questions answered to my satisfaction. I give my informed consent for the services to be provided through the use of telemedicine in my medical care

## 2023-03-10 ENCOUNTER — Ambulatory Visit: Payer: Medicare Other

## 2023-03-11 ENCOUNTER — Ambulatory Visit: Payer: Medicare Other

## 2023-03-11 DIAGNOSIS — R350 Frequency of micturition: Secondary | ICD-10-CM

## 2023-03-11 DIAGNOSIS — N3281 Overactive bladder: Secondary | ICD-10-CM | POA: Diagnosis not present

## 2023-03-11 NOTE — Telephone Encounter (Signed)
 Pt notified via mychart

## 2023-03-11 NOTE — Progress Notes (Addendum)
 PTNS  Session # 2  Health & Social Factors: no change Caffeine: 3 Alcohol: N/A Daytime voids #per day: 10-20 Night-time voids #per night: 2-3 Urgency: Strong Incontinence Episodes #per day: N/A Ankle used: Left Treatment Setting: 7 Feeling/ Response: Sensory   Performed By: Exie CMA  Follow Up: As scheduled

## 2023-03-11 NOTE — Patient Instructions (Signed)

## 2023-03-19 ENCOUNTER — Ambulatory Visit: Payer: Medicare Other

## 2023-03-19 DIAGNOSIS — N3281 Overactive bladder: Secondary | ICD-10-CM

## 2023-03-19 DIAGNOSIS — R32 Unspecified urinary incontinence: Secondary | ICD-10-CM

## 2023-03-19 NOTE — Progress Notes (Addendum)
 PTNS  Session # 3  Health & Social Factors: No change Caffeine: 3 Alcohol: 0 Daytime voids #per day: 10-20 Night-time voids #per night: 3 Urgency: Strong Incontinence Episodes #per day: 5-6 Ankle used: Right Treatment Setting: 10 Feeling/ Response: sensory   Performed By: [facility] LPN  Follow Up: As scheduled

## 2023-03-19 NOTE — Patient Instructions (Signed)

## 2023-03-25 ENCOUNTER — Ambulatory Visit: Payer: Medicare Other | Attending: Cardiology

## 2023-03-25 DIAGNOSIS — Z01818 Encounter for other preprocedural examination: Secondary | ICD-10-CM

## 2023-03-25 DIAGNOSIS — Z01810 Encounter for preprocedural cardiovascular examination: Secondary | ICD-10-CM

## 2023-03-25 NOTE — Progress Notes (Signed)
 Virtual Visit via Telephone Note   Because of [name]'s co-morbid illnesses, she is at least at moderate risk for complications without adequate follow up.  This format is felt to be most appropriate for this patient at this time.  The patient did not have access to video technology/had technical difficulties with video requiring transitioning to audio format only (telephone).  All issues noted in this document were discussed and addressed.  No physical exam could be performed with this format.  Please refer to the patient's chart for her consent to telehealth for [facility].  Evaluation Performed:  Preoperative cardiovascular risk assessment _____________   Date:  03/25/2023   Patient ID:  [name], DOB 1945-10-19, MRN [id number] Patient Location:  Home Provider location:   Office  Primary Care Provider:  Sheryle Carwin, MD Primary Cardiologist:  Soyla DELENA Merck, MD  Chief Complaint / Patient Profile   78 y.o. y/o female with a h/o left carotid artery stenosis, status post left carotid endarterectomy 2022, hyperlipidemia, hypertension, and TIA.  She is pending right total knee arthroplasty by Dr. Garnette Her on date to be determined and presents today for telephonic preoperative cardiovascular risk assessment.   History of Present Illness    [name] is a 78 y.o. female who presents via audio/video conferencing for a telehealth visit today.  Pt was last seen in cardiology clinic on 05/27/2022 by Barnie Hila, DNP, NP to have preoperative cardiac evaluation for same surgery on right knee and was cleared from a cardiac standpoint.  At that time [name] was doing well.  The patient is now pending procedure as outlined above. Since her last visit, she states that she is taking a baby aspirin  daily.  She is not very physically active because of her knee pain.  She is able to clean her home and walk short distances but not do anything exertional which would cause  issues with her knee.  She is medically compliant.  She offers no complaints of chest pain, or shortness of breath.  No dizziness.  She is having her carotid arteries checked through wellness service annually.  Past Medical History    Past Medical History:  Diagnosis Date   Arthritis    Bacterial overgrowth syndrome    Positive HBT   Carotid artery occlusion    GERD (gastroesophageal reflux disease)    Heart murmur    mild AS 03/21/20 echo   Hypercholesteremia    Hypertension    TIA (transient ischemic attack)    Past Surgical History:  Procedure Laterality Date   ABDOMINAL HYSTERECTOMY     BACTERIAL OVERGROWTH TEST N/A 01/07/2013   Procedure: BACTERIAL OVERGROWTH TEST;  Surgeon: Lamar CHRISTELLA Hollingshead, MD;  Location: [facility] SUITE;  Service: Endoscopy;  Laterality: N/A;  7:30   Bladder Tack     x 2   CATARACT EXTRACTION W/ INTRAOCULAR LENS IMPLANT Bilateral    COLONOSCOPY  09/2005   Dr. Mavis: normal. Due for screening 2017   ENDARTERECTOMY Left 03/27/2020   Procedure: LEFT CAROTID ENDARTERECTOMY;  Surgeon: Harvey Carlin BRAVO, MD;  Location: [facility] OR;  Service: Vascular;  Laterality: Left;   ESOPHAGOGASTRODUODENOSCOPY N/A 10/12/2012   RMR: Small inlet patch.  Small hiatal hernia.  Status post gastric biopsy, negative H.pylori   ESOPHAGOGASTRODUODENOSCOPY (EGD) WITH ESOPHAGEAL DILATION  02/2010   Dr. Hollingshead: non-critical Schatzki's ring s/p dilation with 40 F dilator, small hiatal hernia, otherwise normal   FRACTURE SURGERY  left wrist   IR ANGIO EXTERNAL CAROTID SEL EXT CAROTID BILAT MOD SED  03/02/2019   IR ANGIO INTRA EXTRACRAN SEL INTERNAL CAROTID BILAT MOD SED  03/02/2019   IR ANGIO VERTEBRAL SEL VERTEBRAL BILAT MOD SED  03/02/2019   IR US  GUIDE VASC ACCESS RIGHT  03/02/2019   KNEE ARTHROSCOPY     Bilateral (torn meniscus)   ROTATOR CUFF REPAIR     TOTAL KNEE ARTHROPLASTY Left 09/20/2022   Procedure: TOTAL KNEE ARTHROPLASTY;  Surgeon: Kay Kemps, MD;  Location: [facility] ORS;   Service: Orthopedics;  Laterality: Left;  general and spinal   TUBAL LIGATION      Allergies  Allergies  Allergen Reactions   Oxycodone  Other (See Comments)    makes me deathly sick    Home Medications    Prior to Admission medications   Medication Sig Start Date End Date Taking? Authorizing Provider  acetaminophen  (TYLENOL ) 650 MG CR tablet Take 650 mg by mouth 3 (three) times daily as needed for pain (Knee pain).    [provider]  amLODipine  (NORVASC ) 5 MG tablet Take 5 mg by mouth daily. 04/03/15   [provider]  atorvastatin  (LIPITOR) 40 MG tablet Take 1 tablet (40 mg total) by mouth at bedtime. 06/14/21   Evonnie Lenis, MD  B Complex-C (B-COMPLEX WITH VITAMIN C) tablet Take 1 tablet by mouth daily.    [provider]  esomeprazole (NEXIUM) 20 MG capsule Take 20 mg by mouth daily as needed (acid reflux/indigestion.).    [provider]  ezetimibe  (ZETIA ) 10 MG tablet Take 10 mg by mouth daily. 06/15/22   [provider]  Turmeric 500 MG TABS Take 1,000 mg by mouth daily.    [provider]    Physical Exam    Vital Signs:  [name] does not have vital signs available for review today.   Given telephonic nature of communication, physical exam is limited. AAOx3. NAD. Normal affect.  Speech and respirations are unlabored.  Accessory Clinical Findings    None  Assessment & Plan    1.  Preoperative Cardiovascular Risk Assessment:  According to the Revised Cardiac Risk Index (RCRI), her Perioperative Risk of Major Cardiac Event is (%): 6.6  Her Functional Capacity in METs is: 4.61 according to the [employer] Activity Status Index (DASI).  This is due to chronic knee pain not allowing her to be as physically active and mobile as she would like to be.  Although not specifically requested, since the patient has stated that she is taking a baby aspirin  daily, from a cardiac perspective if it is necessary, she may hold the baby  aspirin  for 7 days prior to procedure.  Will defer to surgeon if this is required.  A copy of this note will be routed to requesting surgeon.  Time:   Today, I have spent 10 minutes with the patient with telehealth technology discussing medical history, symptoms, and management plan.     Lamarr Satterfield, NP  03/25/2023, 2:19 PM

## 2023-03-26 ENCOUNTER — Ambulatory Visit: Payer: Medicare Other

## 2023-03-26 DIAGNOSIS — N3281 Overactive bladder: Secondary | ICD-10-CM

## 2023-03-26 NOTE — Patient Instructions (Signed)

## 2023-03-26 NOTE — Progress Notes (Signed)
PTNS  Session # 4  Health & Social Factors: No change Caffeine: 3-4 Alcohol: 0 Daytime voids #per day: 10-20 Night-time voids #per night: 3 Urgency: Strong Incontinence Episodes #per day: 1-2 Ankle used: left Treatment Setting: 6 Feeling/ Response: Pt said "some in her ankle"  Performed By: Gwendolyn Grant   Follow Up: As scheduled

## 2023-03-27 NOTE — Telephone Encounter (Signed)
Office resent clearance request I went ahead and re faxed preop clearance notes to office again .Marland KitchenMarland KitchenSN

## 2023-03-27 NOTE — Telephone Encounter (Signed)
I will make requesting office aware that we have resent that preop clearance notes to Dr. [name] office

## 2023-03-31 ENCOUNTER — Ambulatory Visit (INDEPENDENT_AMBULATORY_CARE_PROVIDER_SITE_OTHER): Payer: Medicare Other

## 2023-03-31 ENCOUNTER — Encounter: Payer: Self-pay | Admitting: Urology

## 2023-03-31 DIAGNOSIS — N3281 Overactive bladder: Secondary | ICD-10-CM | POA: Diagnosis not present

## 2023-03-31 NOTE — Patient Instructions (Signed)

## 2023-03-31 NOTE — Progress Notes (Addendum)
PTNS  Session # 5  Health & Social Factors: no change Caffeine: 4  Alcohol: 0 Daytime voids #per day: 9-11 Night-time voids #per night: 2 Urgency: none Incontinence Episodes #per day: 0  Ankle used: left Treatment Setting: 1 Feeling/ Response: sensory Comments: N/A  Performed By: Kennyth Lose, CMA  Follow Up: keep NV

## 2023-04-07 ENCOUNTER — Ambulatory Visit: Payer: Medicare Other

## 2023-04-07 DIAGNOSIS — N3281 Overactive bladder: Secondary | ICD-10-CM | POA: Diagnosis not present

## 2023-04-07 NOTE — Patient Instructions (Signed)

## 2023-04-07 NOTE — Progress Notes (Signed)
PTNS  Session # 6  Health & Social Factors: no change  Caffeine: 3 Alcohol: 0 Daytime voids #per day: 9-15 Night-time voids #per night: 2 Urgency: mild Incontinence Episodes #per day: 0 Ankle used: left Treatment Setting: 9 Feeling/ Response: both Comments: N/A  Performed By: Kennyth Lose, CMA  Follow Up: Keep nurse visited

## 2023-04-12 ENCOUNTER — Other Ambulatory Visit: Payer: Self-pay

## 2023-04-12 ENCOUNTER — Emergency Department (HOSPITAL_COMMUNITY): Payer: Medicare Other

## 2023-04-12 ENCOUNTER — Emergency Department (HOSPITAL_COMMUNITY)
Admission: EM | Admit: 2023-04-12 | Discharge: 2023-04-12 | Discharge status: Against Medical Advice | Payer: Medicare Other | Attending: Emergency Medicine | Admitting: Emergency Medicine

## 2023-04-12 ENCOUNTER — Encounter (HOSPITAL_COMMUNITY): Payer: Self-pay | Admitting: *Deleted

## 2023-04-12 DIAGNOSIS — R0602 Shortness of breath: Secondary | ICD-10-CM | POA: Diagnosis not present

## 2023-04-12 DIAGNOSIS — Z5321 Procedure and treatment not carried out due to patient leaving prior to being seen by health care provider: Secondary | ICD-10-CM | POA: Diagnosis not present

## 2023-04-12 DIAGNOSIS — R079 Chest pain, unspecified: Secondary | ICD-10-CM | POA: Insufficient documentation

## 2023-04-12 LAB — CBC
HCT: 43.9 % (ref 36.0–46.0)
Hemoglobin: 14.9 g/dL (ref 12.0–15.0)
MCH: 31.4 pg (ref 26.0–34.0)
MCHC: 33.9 g/dL (ref 30.0–36.0)
MCV: 92.4 fL (ref 80.0–100.0)
Platelets: 271 10*3/uL (ref 150–400)
RBC: 4.75 MIL/uL (ref 3.87–5.11)
RDW: 12.7 % (ref 11.5–15.5)
WBC: 13.2 10*3/uL — ABNORMAL HIGH (ref 4.0–10.5)
nRBC: 0 % (ref 0.0–0.2)

## 2023-04-12 LAB — BASIC METABOLIC PANEL
Anion gap: 14 (ref 5–15)
BUN: 17 mg/dL (ref 8–23)
CO2: 23 mmol/L (ref 22–32)
Calcium: 9.3 mg/dL (ref 8.9–10.3)
Chloride: 103 mmol/L (ref 98–111)
Creatinine, Ser: 0.74 mg/dL (ref 0.44–1.00)
GFR, Estimated: >60 mL/min (ref >60)
Glucose, Bld: 159 mg/dL — ABNORMAL HIGH (ref 70–99)
Potassium: 3.8 mmol/L (ref 3.5–5.1)
Sodium: 140 mmol/L (ref 135–145)

## 2023-04-12 LAB — TROPONIN I (HIGH SENSITIVITY)
Troponin I (High Sensitivity): 4 ng/L (ref <18)
Troponin I (High Sensitivity): 8 ng/L (ref <18)

## 2023-04-12 NOTE — ED Triage Notes (Signed)
Pt with mid CP and mid back on right since this morning.  + SOB, denies any N/V

## 2023-04-13 ENCOUNTER — Encounter (HOSPITAL_COMMUNITY): Payer: Self-pay | Admitting: Emergency Medicine

## 2023-04-13 ENCOUNTER — Emergency Department (HOSPITAL_COMMUNITY)
Admission: EM | Admit: 2023-04-13 | Discharge: 2023-04-13 | Disposition: A | Discharge status: Home / Self Care | Payer: Medicare Other

## 2023-04-13 ENCOUNTER — Other Ambulatory Visit: Payer: Self-pay

## 2023-04-13 DIAGNOSIS — R0781 Pleurodynia: Secondary | ICD-10-CM | POA: Diagnosis present

## 2023-04-13 DIAGNOSIS — Z79899 Other long term (current) drug therapy: Secondary | ICD-10-CM | POA: Diagnosis not present

## 2023-04-13 DIAGNOSIS — I10 Essential (primary) hypertension: Secondary | ICD-10-CM | POA: Diagnosis not present

## 2023-04-13 DIAGNOSIS — Z8673 Personal history of transient ischemic attack (TIA), and cerebral infarction without residual deficits: Secondary | ICD-10-CM | POA: Insufficient documentation

## 2023-04-13 DIAGNOSIS — K30 Functional dyspepsia: Secondary | ICD-10-CM | POA: Diagnosis not present

## 2023-04-13 LAB — COMPREHENSIVE METABOLIC PANEL
ALT: 24 U/L (ref 0–44)
AST: 18 U/L (ref 15–41)
Albumin: 3.8 g/dL (ref 3.5–5.0)
Alkaline Phosphatase: 68 U/L (ref 38–126)
Anion gap: 12 (ref 5–15)
BUN: 17 mg/dL (ref 8–23)
CO2: 24 mmol/L (ref 22–32)
Calcium: 9 mg/dL (ref 8.9–10.3)
Chloride: 104 mmol/L (ref 98–111)
Creatinine, Ser: 0.75 mg/dL (ref 0.44–1.00)
GFR, Estimated: >60 mL/min (ref >60)
Glucose, Bld: 107 mg/dL — ABNORMAL HIGH (ref 70–99)
Potassium: 3.5 mmol/L (ref 3.5–5.1)
Sodium: 140 mmol/L (ref 135–145)
Total Bilirubin: 0.9 mg/dL (ref 0.0–1.2)
Total Protein: 6.4 g/dL — ABNORMAL LOW (ref 6.5–8.1)

## 2023-04-13 LAB — CBC
HCT: 43.5 % (ref 36.0–46.0)
Hemoglobin: 14.4 g/dL (ref 12.0–15.0)
MCH: 30.5 pg (ref 26.0–34.0)
MCHC: 33.1 g/dL (ref 30.0–36.0)
MCV: 92.2 fL (ref 80.0–100.0)
Platelets: 259 10*3/uL (ref 150–400)
RBC: 4.72 MIL/uL (ref 3.87–5.11)
RDW: 12.8 % (ref 11.5–15.5)
WBC: 14.5 10*3/uL — ABNORMAL HIGH (ref 4.0–10.5)
nRBC: 0 % (ref 0.0–0.2)

## 2023-04-13 LAB — LIPASE, BLOOD: Lipase: 32 U/L (ref 11–51)

## 2023-04-13 LAB — D-DIMER, QUANTITATIVE: D-Dimer, Quant: 0.7 ug{FEU}/mL — ABNORMAL HIGH (ref 0.00–0.50)

## 2023-04-13 LAB — TROPONIN I (HIGH SENSITIVITY): Troponin I (High Sensitivity): 4 ng/L (ref <18)

## 2023-04-13 MED ORDER — ALUM & MAG HYDROXIDE-SIMETH 200-200-20 MG/5ML PO SUSP
15.0000 mL | Freq: Once | ORAL | Status: AC
  Administered 2023-04-13: 15 mL via ORAL
  Filled 2023-04-13: qty 30

## 2023-04-13 NOTE — ED Triage Notes (Signed)
Pt endorses mid back pain that radiates around her right ribs for 3 weeks. Also endorses indigestion and SOB with the pain.

## 2023-04-13 NOTE — ED Provider Notes (Signed)
[place] EMERGENCY DEPARTMENT AT [facility] Provider Note   CSN: [id number] Arrival date & time: 04/13/23  0865     History  Chief Complaint  Patient presents with   Back Pain   Gastroesophageal Reflux    [name] is a 78 y.o. female with history of GERD, hyperlipidemia, hypertension, carotid artery occlusion, aortic stenosis, TIA, who presents emergency department complaining of mid back pain, indigestion, shortness of breath.  States pain has been going on for around 3 weeks, radiates from her back to the right side of her ribs.  Is not sure if it was caused by this, but did start after she picked up a bucket of water.  She has been taking Nexium, Tums, and Gas-X, that gives her some relief of her indigestion.  She presented to the ER last night and had labs drawn and a chest x-ray done, and when she checked her results this morning she was worried about her troponin levels, as she had read that her symptoms could be indicative of a heart attack.   Back Pain Gastroesophageal Reflux       Home Medications Prior to Admission medications   Medication Sig Start Date End Date Taking? Authorizing Provider  acetaminophen (TYLENOL) 650 MG CR tablet Take 650 mg by mouth 3 (three) times daily as needed for pain (Knee pain).    [provider]  amLODipine (NORVASC) 5 MG tablet Take 5 mg by mouth daily. 04/03/15   [provider]  atorvastatin (LIPITOR) 40 MG tablet Take 1 tablet (40 mg total) by mouth at bedtime. 06/14/21   Catarina Hartshorn, MD  B Complex-C (B-COMPLEX WITH VITAMIN C) tablet Take 1 tablet by mouth daily.    [provider]  esomeprazole (NEXIUM) 20 MG capsule Take 20 mg by mouth daily as needed (acid reflux/indigestion.).    [provider]  ezetimibe (ZETIA) 10 MG tablet Take 10 mg by mouth daily. 06/15/22   [provider]  Turmeric 500 MG TABS Take 1,000 mg by mouth daily.    [provider]      Allergies     Oxycodone    Review of Systems   Review of Systems  Respiratory:         Pleuritic CP  Gastrointestinal:        Indigestion  Musculoskeletal:  Positive for back pain.       Right sided rib pain  All other systems reviewed and are negative.   Physical Exam Updated Vital Signs BP (!) 142/67   Pulse 68   Temp 98.1 F (36.7 C) (Oral)   Resp 12   Ht 5\' 1"  (1.549 m)   Wt 81 kg   SpO2 96%   BMI 33.74 kg/m  Physical Exam Vitals and nursing note reviewed.  Constitutional:      Appearance: Normal appearance.  HENT:     Head: Normocephalic and atraumatic.  Eyes:     Conjunctiva/sclera: Conjunctivae normal.  Cardiovascular:     Rate and Rhythm: Normal rate and regular rhythm.  Pulmonary:     Effort: Pulmonary effort is normal. No respiratory distress.     Breath sounds: Normal breath sounds.  Abdominal:     General: There is no distension.     Palpations: Abdomen is soft.     Tenderness: There is no abdominal tenderness.  Musculoskeletal:     Right lower leg: No edema.     Left lower leg: No edema.  Skin:  General: Skin is warm and dry.  Neurological:     General: No focal deficit present.     Mental Status: She is alert.     ED Results / Procedures / Treatments   Labs (all labs ordered are listed, but only abnormal results are displayed) Labs Reviewed  CBC - Abnormal; Notable for the following components:      Result Value   WBC 14.5 (*)    All other components within normal limits  COMPREHENSIVE METABOLIC PANEL - Abnormal; Notable for the following components:   Glucose, Bld 107 (*)    Total Protein 6.4 (*)    All other components within normal limits  D-DIMER, QUANTITATIVE - Abnormal; Notable for the following components:   D-Dimer, Quant 0.70 (*)    All other components within normal limits  LIPASE, BLOOD  TROPONIN I (HIGH SENSITIVITY)    EKG None   Radiology DG Chest 2 View Result Date: 04/12/2023 CLINICAL DATA:  Chest pain. EXAM: CHEST - 2  VIEW COMPARISON:  September 24, 2022. FINDINGS: The heart size and mediastinal contours are within normal limits. Both lungs are clear. The visualized skeletal structures are unremarkable. IMPRESSION: No active cardiopulmonary disease. Electronically Signed   By: Lupita Raider M.D.   On: 04/12/2023 18:25    Procedures Procedures    Medications Ordered in ED Medications  alum & mag hydroxide-simeth (MAALOX/MYLANTA) 200-200-20 MG/5ML suspension 15 mL (15 mLs Oral Given 04/13/23 1009)    ED Course/ Medical Decision Making/ A&P                                 Medical Decision Making Amount and/or Complexity of Data Reviewed Labs: ordered.   This patient is a 78 y.o. female  who presents to the ED for concern of right sided back and rib pain.   Differential diagnoses prior to evaluation: The emergent differential diagnosis includes, but is not limited to,  ACS, PE, AAA, pancreatitis, biliary colic, hepatitis, pneumonia, pneumothorax, rib fracture. This is not an exhaustive differential.   Past Medical History / Co-morbidities / Social History: GERD, hyperlipidemia, hypertension, carotid artery occlusion, aortic stenosis, TIA  Additional history: Chart reviewed. Pertinent results include: Reviewed triage note, labs, and CXR performed yesterday: WBC 13.2, initial troponin 4, delta troponin 8.   Physical Exam: Physical exam performed. The pertinent findings include: Hypertensive, otherwise normal vital signs, no acute distress.  Lung sounds clear, heart regular rate and rhythm, no peripheral edema.  Lab Tests/Imaging studies: I personally interpreted labs/imaging and the pertinent results include:  WBC 14.5, CMP unremarkable, normal lipase, age adjusted d-dimer negative (0.70). Troponin of 4.   I considered imaging today but as patient had normal CXR yesterday and I have very low concern for underlying pulmonary pathology such as missed pneumonia, rib fractures, or PE, will defer emergent  chest imaging today. Pain does not seem clinically consistent with AAA, patient with reassuring vital signs, so do not feel need for dissection study.   Cardiac monitoring: EKG obtained and interpreted by myself and attending physician which shows: sinus rhythm, similar compared to prior yesterday   Medications: I ordered medication including maalox.  I have reviewed the patients home medicines and have made adjustments as needed. On reevaluation, pain has resolved.    Disposition: After consideration of the diagnostic results and the patients response to treatment, I feel that emergency department workup does not suggest an emergent condition requiring  admission or immediate intervention beyond what has been performed at this time. The plan is: discharge to home. Very reassuring exam, normal troponin, negative d-dimer, EKG without acute ischemic changes. Low clinical concern for emergent pathology of symptoms today. Encouraged patient to continue reflux medications as these seem to help her symptoms, and follow up with PCP. The patient is safe for discharge and has been instructed to return immediately for worsening symptoms, change in symptoms or any other concerns.  Final Clinical Impression(s) / ED Diagnoses Final diagnoses:  Rib pain on right side  Indigestion    Rx / DC Orders ED Discharge Orders     None      Portions of this report may have been transcribed using voice recognition software. Every effort was made to ensure accuracy; however, inadvertent computerized transcription errors may be present.    Jeanella Flattery 04/13/23 1108    Durwin Glaze, MD 04/13/23 1116

## 2023-04-13 NOTE — Discharge Instructions (Signed)
You were seen in the emergency department today for back/rib pain and indigestion.  As we discussed your blood work looked very reassuring.  It did not show any evidence of increased demand on your heart, or evidence of blood clot.  I gave you a medication that helps with reflux, and I recommend taking similar products at home.  I also recommend following up with your primary doctor if the symptoms continue.  If you have any new or worsening symptoms, I recommend returning to the ER for reevaluation.

## 2023-04-14 ENCOUNTER — Ambulatory Visit: Payer: Medicare Other

## 2023-04-14 DIAGNOSIS — N3281 Overactive bladder: Secondary | ICD-10-CM

## 2023-04-14 NOTE — Patient Instructions (Signed)

## 2023-04-14 NOTE — Progress Notes (Signed)
PTNS  Session # 7  Health & Social Factors: no Change Caffeine: 3 Alcohol: 0 Daytime voids #per day: 10 Night-time voids #per night: 1-2 Urgency: mild Incontinence Episodes #per day: once or twice Ankle used: left Treatment Setting: 5 Feeling/ Response: both Comments: N/A  Performed By: Kennyth Lose, CMA  Follow Up: Keep nurse visited

## 2023-04-18 ENCOUNTER — Emergency Department (HOSPITAL_COMMUNITY)
Admission: EM | Admit: 2023-04-18 | Discharge: 2023-04-18 | Disposition: A | Discharge status: Home / Self Care | Payer: Medicare Other | Attending: Emergency Medicine | Admitting: Emergency Medicine

## 2023-04-18 ENCOUNTER — Encounter (HOSPITAL_COMMUNITY): Payer: Self-pay | Admitting: *Deleted

## 2023-04-18 ENCOUNTER — Other Ambulatory Visit: Payer: Self-pay

## 2023-04-18 ENCOUNTER — Emergency Department (HOSPITAL_COMMUNITY): Payer: Medicare Other

## 2023-04-18 DIAGNOSIS — R011 Cardiac murmur, unspecified: Secondary | ICD-10-CM | POA: Insufficient documentation

## 2023-04-18 DIAGNOSIS — I10 Essential (primary) hypertension: Secondary | ICD-10-CM | POA: Insufficient documentation

## 2023-04-18 DIAGNOSIS — M546 Pain in thoracic spine: Secondary | ICD-10-CM | POA: Diagnosis present

## 2023-04-18 DIAGNOSIS — Z79899 Other long term (current) drug therapy: Secondary | ICD-10-CM | POA: Diagnosis not present

## 2023-04-18 DIAGNOSIS — R0781 Pleurodynia: Secondary | ICD-10-CM | POA: Insufficient documentation

## 2023-04-18 DIAGNOSIS — M549 Dorsalgia, unspecified: Secondary | ICD-10-CM

## 2023-04-18 LAB — BASIC METABOLIC PANEL
Anion gap: 11 (ref 5–15)
BUN: 18 mg/dL (ref 8–23)
CO2: 28 mmol/L (ref 22–32)
Calcium: 9.4 mg/dL (ref 8.9–10.3)
Chloride: 99 mmol/L (ref 98–111)
Creatinine, Ser: 0.83 mg/dL (ref 0.44–1.00)
GFR, Estimated: >60 mL/min (ref >60)
Glucose, Bld: 103 mg/dL — ABNORMAL HIGH (ref 70–99)
Potassium: 4 mmol/L (ref 3.5–5.1)
Sodium: 138 mmol/L (ref 135–145)

## 2023-04-18 LAB — CBC WITH DIFFERENTIAL/PLATELET
Abs Immature Granulocytes: 0.08 10*3/uL — ABNORMAL HIGH (ref 0.00–0.07)
Basophils Absolute: 0 10*3/uL (ref 0.0–0.1)
Basophils Relative: 0 %
Eosinophils Absolute: 0.1 10*3/uL (ref 0.0–0.5)
Eosinophils Relative: 1 %
HCT: 45.2 % (ref 36.0–46.0)
Hemoglobin: 14.9 g/dL (ref 12.0–15.0)
Immature Granulocytes: 1 %
Lymphocytes Relative: 29 %
Lymphs Abs: 3.3 10*3/uL (ref 0.7–4.0)
MCH: 31.1 pg (ref 26.0–34.0)
MCHC: 33 g/dL (ref 30.0–36.0)
MCV: 94.4 fL (ref 80.0–100.0)
Monocytes Absolute: 0.8 10*3/uL (ref 0.1–1.0)
Monocytes Relative: 7 %
Neutro Abs: 7.1 10*3/uL (ref 1.7–7.7)
Neutrophils Relative %: 62 %
Platelets: 231 10*3/uL (ref 150–400)
RBC: 4.79 MIL/uL (ref 3.87–5.11)
RDW: 12.5 % (ref 11.5–15.5)
WBC: 11.4 10*3/uL — ABNORMAL HIGH (ref 4.0–10.5)
nRBC: 0 % (ref 0.0–0.2)

## 2023-04-18 MED ORDER — HYDROCODONE-ACETAMINOPHEN 5-325 MG PO TABS: ORAL_TABLET | ORAL | 0 refills | Status: DC

## 2023-04-18 NOTE — ED Triage Notes (Addendum)
 Pt with mid to right back pain for a month.  Pt seen on 2/2 for same.  Pt requesting MRI, states she one scheduled 3 weeks out.

## 2023-04-18 NOTE — ED Provider Notes (Signed)
 [place] EMERGENCY DEPARTMENT AT [facility] Provider Note   CSN: [id number] Arrival date & time: 04/18/23  1256     History  Chief Complaint  Patient presents with   Back Pain    [name] is a 78 y.o. female.   Back Pain Associated symptoms: chest pain (Right rib pain)   Associated symptoms: no abdominal pain, no fever, no numbness and no weakness         [name] is a 78 y.o. female with past medical history of TIA, hypertension, GERD who presents to the Emergency Department complaining of persistent right mid back and right rib pain.  Symptoms have been present for 1 month.  Began after lifting several heavy buckets of water .  Has been seen at urgent care and seen here on 225 for same.  She had cardiac workup on her previous ER visit.  Initially seen at urgent care, was prescribed steroids without improvement.  She is also followed up with orthopedics regarding this.  She was prescribed gabapentin  which she states she is currently taking without improvement of her symptoms.  Describes pain is gradually worsening, she has pain with certain movements, lifting or raising her right arm, coughing sneezing or laughing.  Pain somewhat improves when she is supine.  She denies any abdominal pain, arm neck or jaw pain, extremity numbness or weakness or shortness of breath.   Home Medications Prior to Admission medications   Medication Sig Start Date End Date Taking? Authorizing Provider  acetaminophen  (TYLENOL ) 650 MG CR tablet Take 650 mg by mouth 3 (three) times daily as needed for pain (Knee pain).    [provider]  amLODipine  (NORVASC ) 5 MG tablet Take 5 mg by mouth daily. 04/03/15   [provider]  atorvastatin  (LIPITOR) 40 MG tablet Take 1 tablet (40 mg total) by mouth at bedtime. 06/14/21   Evonnie Lenis, MD  B Complex-C (B-COMPLEX WITH VITAMIN C) tablet Take 1 tablet by mouth daily.    [provider]  esomeprazole (NEXIUM) 20 MG capsule  Take 20 mg by mouth daily as needed (acid reflux/indigestion.).    [provider]  ezetimibe  (ZETIA ) 10 MG tablet Take 10 mg by mouth daily. 06/15/22   [provider]  Turmeric 500 MG TABS Take 1,000 mg by mouth daily.    [provider]      Allergies    Oxycodone     Review of Systems   Review of Systems  Constitutional:  Negative for appetite change, chills and fever.  Respiratory:  Negative for shortness of breath.   Cardiovascular:  Positive for chest pain (Right rib pain).  Gastrointestinal:  Negative for abdominal pain, nausea and vomiting.  Musculoskeletal:  Positive for back pain. Negative for neck pain.  Skin:  Negative for rash.  Neurological:  Negative for dizziness, weakness and numbness.    Physical Exam Updated Vital Signs BP (!) 149/90 (BP Location: Left Arm)   Pulse 97   Temp 98.6 F (37 C) (Oral)   Resp 20   Ht 5' 1 (1.549 m)   Wt 81 kg   SpO2 100%   BMI 33.74 kg/m  Physical Exam Vitals and nursing note reviewed.  Constitutional:      General: She is not in acute distress.    Appearance: Normal appearance.  Cardiovascular:     Rate and Rhythm: Normal rate and regular rhythm.     Pulses: Normal pulses.     Heart sounds: Murmur  heard.  Pulmonary:     Effort: Pulmonary effort is normal. No respiratory distress.     Breath sounds: No wheezing.     Comments: Mild tenderness palpation along the lateral right rib area.  No edema or crepitus Chest:     Chest wall: Tenderness present.  Abdominal:     General: There is no distension.     Palpations: Abdomen is soft.     Tenderness: There is no abdominal tenderness.  Musculoskeletal:        General: Tenderness present. Normal range of motion.     Cervical back: Normal range of motion.     Comments: Mild tenderness along the mid thoracic spine.  No bony step-offs.  Skin:    General: Skin is warm.     Capillary Refill: Capillary refill takes less than 2 seconds.  Neurological:      General: No focal deficit present.     Mental Status: She is alert.     Sensory: No sensory deficit.     Motor: No weakness.     ED Results / Procedures / Treatments   Labs (all labs ordered are listed, but only abnormal results are displayed) Labs Reviewed  CBC WITH DIFFERENTIAL/PLATELET - Abnormal; Notable for the following components:      Result Value   WBC 11.4 (*)    Abs Immature Granulocytes 0.08 (*)    All other components within normal limits  BASIC METABOLIC PANEL - Abnormal; Notable for the following components:   Glucose, Bld 103 (*)    All other components within normal limits    EKG None  Radiology MR THORACIC SPINE WO CONTRAST Result Date: 04/18/2023 CLINICAL DATA:  Mid back pain.  Right thoracic pain for 1 month. EXAM: MRI THORACIC SPINE WITHOUT CONTRAST TECHNIQUE: Multiplanar, multisequence MR imaging of the thoracic spine was performed. No intravenous contrast was administered. COMPARISON:  Two-view chest x-ray 2/1/5 FINDINGS: Alignment: No significant listhesis is present. Thoracic kyphosis is within normal limits. Vertebrae: Inferior endplate Schmorl's nodes are present at T10 and T11. A hemangioma at T12 measures 6 mm. Marrow signal and vertebral body heights are otherwise normal. Degenerative changes are present in the lower cervical spine. Cord: Normal signal and morphology. Conus medullaris terminates at L1-2. Paraspinal and other soft tissues: The paraspinous soft tissues are within normal limits. Lung fields are clear. The upper abdomen is unremarkable. Disc levels: A shallow disc protrusion is present T6-7 without focal stenosis. Mild facet hypertrophy is present bilaterally at T10-11 without significant stenosis. No other significant disc protrusion or central canal stenosis is present. The foramina are patent bilaterally. Mild to moderate central and right greater than left foraminal narrowing is present at C5-6 and C6-7. IMPRESSION: 1. Shallow disc  protrusion at T6-7 without focal stenosis. 2. Mild facet hypertrophy bilaterally at T10-11 without significant stenosis. 3. Mild to moderate central and right greater than left foraminal narrowing at C5-6 and C6-7. Electronically Signed   By: Lonni Necessary M.D.   On: 04/18/2023 19:18    Procedures Procedures    Medications Ordered in ED Medications - No data to display  ED Course/ Medical Decision Making/ A&P                                 Medical Decision Making Patient here for evaluation of right rib and right mid back pain x 1 month.  Symptoms gradually worsening.  She has been evaluated  at urgent care for this and also here on 04/13/2023 as well as orthopedics.  Had cardiac workup that was reassuring.  She is continue to have reproducible pain that has not been relieved with steroids, Tylenol , or gabapentin .  No focal neurodeficits on my exam.  She does have some focal tenderness along the right lateral rib and mid thoracic spine.  I do not appreciate any bony step-offs.  I suspect this is musculoskeletal versus compression fracture.  Dissection, PE, ACS also considered but given duration of her symptoms and reassuring cardiac workup with reassuring age-adjusted D-dimer on previous ER visit my clinical suspicion for dissection or PE is low.  Amount and/or Complexity of Data Reviewed Labs: ordered.    Details: Labs unremarkable Radiology: ordered.    Details: MRI thoracic spine with shallow disc protrusion at T6-7 without focal stenosis Discussion of management or test interpretation with external provider(s):   MRI thoracic spine ordered.  Patient offered pain medication but declined.  Again, suspect this is musculoskeletal versus compression fracture.   On recheck, patient resting comfortably.  Discussed MRI findings.  I feel she is appropriate for discharge home, will provide short course of pain medication, database reviewed.  Takes tramadol  for chronic knee pain will be  discontinued tramadol  and Tylenol  while taking hydrocodone .  Have also recommended over-the-counter lidocaine  patches for symptomatic relief.  She is agreeable to close outpatient follow-up with PCP and/or neurosurgery.  Risk Prescription drug management.           Final Clinical Impression(s) / ED Diagnoses Final diagnoses:  Mid back pain on right side    Rx / DC Orders ED Discharge Orders     None         Maliya Marich, PA-C 04/18/23 SCARLETT Patsey Lot, MD 04/19/23 BEATRIS

## 2023-04-18 NOTE — Discharge Instructions (Signed)
 As discussed, stop your tramadol  and Tylenol .  Take pain medication as directed.  Also, you may apply over-the-counter lidocaine  patches such as Salonpas to your back.  Use as directed.  Follow-up with your primary care provider for recheck or you may follow-up with the neurosurgery group listed.  Return to emergency department for any new or worsening symptoms

## 2023-04-23 ENCOUNTER — Ambulatory Visit (INDEPENDENT_AMBULATORY_CARE_PROVIDER_SITE_OTHER): Payer: Medicare Other

## 2023-04-23 DIAGNOSIS — N3281 Overactive bladder: Secondary | ICD-10-CM

## 2023-04-23 NOTE — Progress Notes (Signed)
PTNS  Session # 8 of 12  PTNS was prescribed for the patient's Overactive Bladder symptoms. The voiding diary and/or patients symptoms were reviewed prior to the start of treatment.   Patient Goals: Reduce urgency, reduce frequency, No accidents, sleep through night,void every 4 hours  Health & Social Factors:  Caffeine: coffee 3 /day Alcohol: no Daytime voids #per day: 10-12 Night-time voids #per night: 2 Urgency: yes Incontinence Episodes #per day: 2 a night Treatment Plan/Comments: Reduce fluids and reduce caffeine  Ankle used: right Treatment Setting: 2 Feeling/ Response: negative sensation ( heel pain "hurt")  PTNS Treatment The needle electrode was inserted into the lower, inner aspect of patient's leg. The surface electrode was placed on the inside arch of the foot on the treatment leg.The lead set was connected to the stimulator, and the needle electrode clip was connected to the needle electrode. The stimulator that produces an adjustable electrical pulse that travels to the sacral nerve plexus via the tibial nerve was increased until a patient response was observed.    Performed By: Alfonse Spruce CMA  Follow Up: As scheduled

## 2023-04-23 NOTE — Patient Instructions (Signed)

## 2023-04-28 ENCOUNTER — Telehealth: Payer: Self-pay

## 2023-04-28 ENCOUNTER — Ambulatory Visit (INDEPENDENT_AMBULATORY_CARE_PROVIDER_SITE_OTHER): Payer: Medicare Other

## 2023-04-28 DIAGNOSIS — R350 Frequency of micturition: Secondary | ICD-10-CM

## 2023-04-28 DIAGNOSIS — N3281 Overactive bladder: Secondary | ICD-10-CM

## 2023-04-28 DIAGNOSIS — R32 Unspecified urinary incontinence: Secondary | ICD-10-CM

## 2023-04-28 NOTE — Patient Instructions (Signed)

## 2023-04-28 NOTE — Telephone Encounter (Signed)
Tried calling patient on both home and cell phone to rescheduled nurse visited . No answer and unable to leave voiced message on cell phone. Left voice message on home phone.

## 2023-04-28 NOTE — Progress Notes (Signed)
PTNS  Session # 9 of 12  PTNS was prescribed for the patient's Overactive Bladder symptoms. The voiding diary and/or patients symptoms were reviewed prior to the start of treatment.   Patient Goals: Reduced Urgency  Health & Social Factors: no change Caffeine: caffeinated soft drinks 2 /day Alcohol: no Daytime voids #per day: 10 + Night-time voids #per night: 11 Urgency: yes mild to strong Incontinence Episodes #per day: 2 Treatment Plan/Comments: N/A  Ankle used: right Treatment Setting: 7 Feeling/ Response: in ankle sensation  PTNS Treatment The needle electrode was inserted into the lower, inner aspect of patient's leg. The surface electrode was placed on the inside arch of the foot on the treatment leg.The lead set was connected to the stimulator, and the needle electrode clip was connected to the needle electrode. The stimulator that produces an adjustable electrical pulse that travels to the sacral nerve plexus via the tibial nerve was increased until a patient response was observed.    Performed By: Kennyth Lose, CMA  Follow Up: Keep nurse visited

## 2023-05-05 ENCOUNTER — Ambulatory Visit (INDEPENDENT_AMBULATORY_CARE_PROVIDER_SITE_OTHER): Payer: Medicare Other

## 2023-05-05 DIAGNOSIS — N3281 Overactive bladder: Secondary | ICD-10-CM | POA: Diagnosis not present

## 2023-05-05 NOTE — Progress Notes (Signed)
 PTNS  Session # 10 of 12  PTNS was prescribed for the patient's Overactive Bladder symptoms. The voiding diary and/or patients symptoms were reviewed prior to the start of treatment.   Patient Goals: Reduced urgency, reduced frequency, no accidents, sleep through the night, void every 4 hours  Health & Social Factors: No change Caffeine: coffee 2-3 /day Alcohol: no Daytime voids #per day: 8-10 Night-time voids #per night: 2 Urgency: mild Incontinence Episodes #per day: 2 Treatment Plan/Comments: Reduce caffine  Ankle used: left Treatment Setting: 2 Feeling/ Response: ankle sensation  PTNS Treatment The needle electrode was inserted into the lower, inner aspect of patient's leg. The surface electrode was placed on the inside arch of the foot on the treatment leg.The lead set was connected to the stimulator, and the needle electrode clip was connected to the needle electrode. The stimulator that produces an adjustable electrical pulse that travels to the sacral nerve plexus via the tibial nerve was increased until a patient response was observed.    Performed By: Alfonse Spruce CMA  Follow Up: As scheduled

## 2023-05-12 ENCOUNTER — Ambulatory Visit (INDEPENDENT_AMBULATORY_CARE_PROVIDER_SITE_OTHER): Payer: Medicare Other

## 2023-05-12 DIAGNOSIS — N3281 Overactive bladder: Secondary | ICD-10-CM

## 2023-05-12 NOTE — Patient Instructions (Signed)

## 2023-05-12 NOTE — Progress Notes (Signed)
 PTNS  Session # 11 of 12  PTNS was prescribed for the patient's Overactive Bladder symptoms. The voiding diary and/or patients symptoms were reviewed prior to the start of treatment.   Patient Goals: Reduce Urgency  Health & Social Factors: no change Caffeine:  2-3 Alcohol: no Daytime voids #per day: 10+ Night-time voids #per night: 2 Urgency: yes mild Incontinence Episodes #per day: 2-3 Treatment Plan/Comments: NA  Ankle used: left Treatment Setting: 8 Feeling/ Response: positive sensation  PTNS Treatment The needle electrode was inserted into the lower, inner aspect of patient's leg. The surface electrode was placed on the inside arch of the foot on the treatment leg.The lead set was connected to the stimulator, and the needle electrode clip was connected to the needle electrode. The stimulator that produces an adjustable electrical pulse that travels to the sacral nerve plexus via the tibial nerve was increased until a patient response was observed.    Performed By: Kennyth Lose, CMA  Follow Up: Keep nurse visited

## 2023-05-15 NOTE — H&P (Signed)
 Patient's anticipated LOS is less than 2 midnights, meeting these requirements: - Younger than 44 - Lives within 1 hour of care - Has a competent adult at home to recover with post-op recover - NO history of  - Chronic pain requiring opiods  - Diabetes  - Coronary Artery Disease  - Heart failure  - Heart attack  - Stroke  - DVT/VTE  - Cardiac arrhythmia  - Respiratory Failure/COPD  - Renal failure  - Anemia  - Advanced Liver disease     [name] is an 78 y.o. female.    Chief Complaint: right knee pain  HPI: Pt is a 78 y.o. female complaining of right knee pain for multiple years. Pain had continually increased since the beginning. X-rays in the clinic show end-stage arthritic changes of the right knee. Pt has tried various conservative treatments which have failed to alleviate their symptoms, including injections and therapy. Various options are discussed with the patient. Risks, benefits and expectations were discussed with the patient. Patient understand the risks, benefits and expectations and wishes to proceed with surgery.   PCP:  Carylon Perches, MD  D/C Plans: Home  PMH: Past Medical History:  Diagnosis Date   Arthritis    Bacterial overgrowth syndrome    Positive HBT   Carotid artery occlusion    GERD (gastroesophageal reflux disease)    Heart murmur    mild AS 03/21/20 echo   Hypercholesteremia    Hypertension    TIA (transient ischemic attack)     PSH: Past Surgical History:  Procedure Laterality Date   ABDOMINAL HYSTERECTOMY     BACTERIAL OVERGROWTH TEST N/A 01/07/2013   Procedure: BACTERIAL OVERGROWTH TEST;  Surgeon: Corbin Ade, MD;  Location: [facility];  Service: Endoscopy;  Laterality: N/A;  7:30   Bladder Tack     x 2   CATARACT EXTRACTION W/ INTRAOCULAR LENS IMPLANT Bilateral    COLONOSCOPY  09/2005   Dr. Lovell Sheehan: normal. Due for screening 2017   ENDARTERECTOMY Left 03/27/2020   Procedure: LEFT CAROTID ENDARTERECTOMY;  Surgeon: Sherren Kerns, MD;  Location: [facility] OR;  Service: Vascular;  Laterality: Left;   ESOPHAGOGASTRODUODENOSCOPY N/A 10/12/2012   RMR: Small inlet patch.  Small hiatal hernia.  Status post gastric biopsy, negative H.pylori   ESOPHAGOGASTRODUODENOSCOPY (EGD) WITH ESOPHAGEAL DILATION  02/2010   Dr. Jena Gauss: non-critical Schatzki's ring s/p dilation with 20 F dilator, small hiatal hernia, otherwise normal   FRACTURE SURGERY     left wrist   IR ANGIO EXTERNAL CAROTID SEL EXT CAROTID BILAT MOD SED  03/02/2019   IR ANGIO INTRA EXTRACRAN SEL INTERNAL CAROTID BILAT MOD SED  03/02/2019   IR ANGIO VERTEBRAL SEL VERTEBRAL BILAT MOD SED  03/02/2019   IR US GUIDE VASC ACCESS RIGHT  03/02/2019   KNEE ARTHROSCOPY     Bilateral (torn meniscus)   ROTATOR CUFF REPAIR     TOTAL KNEE ARTHROPLASTY Left 09/20/2022   Procedure: TOTAL KNEE ARTHROPLASTY;  Surgeon: Beverely Low, MD;  Location: [facility] ORS;  Service: Orthopedics;  Laterality: Left;  general and spinal   TUBAL LIGATION      Social History:  reports that she has never smoked. She has never used smokeless tobacco. She reports that she does not drink alcohol and does not use drugs. BMI: Estimated body mass index is 33.74 kg/m as calculated from the following:   Height as of 04/18/23: 5\' 1"  (1.549 m).   Weight as of 04/18/23: 81 kg.  Lab Results  Component Value Date   ALBUMIN 3.8 04/13/2023   Diabetes: Patient does not have a diagnosis of diabetes.     Smoking Status:   reports that she has never smoked. She has never used smokeless tobacco.    Allergies:  Allergies  Allergen Reactions   Oxycodone Other (See Comments)    "makes me deathly sick"    Medications: No current facility-administered medications for this encounter.   Current Outpatient Medications  Medication Sig Dispense Refill   amLODipine (NORVASC) 5 MG tablet Take 5 mg by mouth daily.  1   atorvastatin (LIPITOR) 40 MG tablet Take 1 tablet (40 mg total) by mouth at bedtime. 30 tablet 1   B  Complex-C (B-COMPLEX WITH VITAMIN C) tablet Take 1 tablet by mouth daily.     esomeprazole (NEXIUM) 20 MG capsule Take 20 mg by mouth daily as needed (acid reflux/indigestion.).     ezetimibe (ZETIA) 10 MG tablet Take 10 mg by mouth daily.     HYDROcodone-acetaminophen (NORCO/VICODIN) 5-325 MG tablet Take one tab po q 4 hrs prn pain 12 tablet 0   Turmeric 500 MG TABS Take 1,000 mg by mouth daily.      No results found for this or any previous visit (from the past 48 hours). No results found.  ROS: Pain with rom of the right lower extremity  Physical Exam: Alert and oriented 78 y.o. female in no acute distress Cranial nerves 2-12 intact Cervical spine: full rom with no tenderness, nv intact distally Chest: active breath sounds bilaterally, no wheeze rhonchi or rales Heart: regular rate and rhythm, no murmur Abd: non tender non distended with active bowel sounds Hip is stable with rom  Right knee painful rom with crepitus Antalgic gait Nv intact distally  Assessment/Plan Assessment: right knee end stage osteoarthritis  Plan:  Patient will undergo a right total knee by Dr. [name] at [facility] Risks benefits and expectations were discussed with the patient. Patient understand risks, benefits and expectations and wishes to proceed. Preoperative templating of the joint replacement has been completed, documented, and submitted to the Operating Room personnel in order to optimize intra-operative equipment management.   Alphonsa Overall PA-C, MPAS [facility] is now [employer] [address], [place], [place] [address] Phone: [phone] [email or web address] Facebook  [employer]

## 2023-05-15 NOTE — Progress Notes (Signed)
 Sent message, via epic in basket, requesting orders in epic from Careers adviser.

## 2023-05-19 ENCOUNTER — Ambulatory Visit: Payer: Medicare Other

## 2023-05-20 NOTE — Progress Notes (Signed)
 COVID Vaccine received:  []  No [x]  Yes Date of any COVID positive Test in last 90 days:  PCP - Carylon Perches, MD  Cardiologist - Weston Brass, MD,  Joni Reining, NP  cardiac clearance in 03-25-23 Epic note  Chest x-ray - 04-12-2023  2v  Epic EKG -  04-14-2023  Epic Stress Test -  ECHO - 12-10-2021  Epic Cardiac Cath -   PCR screen: [x]  Ordered & Completed []   No Order but Needs PROFEND     []   N/A for this surgery  Surgery Plan:  []  Ambulatory   [x]  Outpatient in bed  []  Admit Anesthesia:    []  General  [x]  Spinal  []   Choice []   MAC  Pacemaker / ICD device [x]  No []  Yes   Spinal Cord Stimulator:[x]  No []  Yes       History of Sleep Apnea? [x]  No []  Yes   CPAP used?- [x]  No []  Yes    Does the patient monitor blood sugar?   [x]  N/A   []  No []  Yes  Patient has: [x]  NO Hx DM   []  Pre-DM   []  DM1  []   DM2  Blood Thinner / Instructions: none Aspirin Instructions:  ASA 81 mg   Hold x 7 days  ERAS Protocol Ordered: []  No  [x]  Yes PRE-SURGERY [x]  ENSURE  []  G2   Patient is to be NPO after: 07:30  Dental hx: []  Dentures:  []  N/A      []  Bridge or Partial:                   []  Loose or Damaged teeth:   Comments: Patient was given the 5 CHG shower / bath instructions for / TKA  surgery along with 2 bottles of the CHG soap. Patient will start this on:  05-26-23 All questions were asked and answered, Patient voiced understanding of this process.   Activity level: Patient is able / unable to climb a flight of stairs without difficulty; []  No CP  []  No SOB, but would have ___   Patient can / can not perform ADLs without assistance.   Anesthesia review: Mild AS, murmur, Hx TIA, HTN, GERD,  Patient denies shortness of breath, fever, cough and chest pain at PAT appointment.  Patient verbalized understanding and agreement to the Pre-Surgical Instructions that were given to them at this PAT appointment. Patient was also educated of the need to review these PAT instructions again prior to her  surgery.I reviewed the appropriate phone numbers to call if they have any and questions or concerns.

## 2023-05-20 NOTE — Patient Instructions (Signed)
 SURGICAL WAITING ROOM VISITATION Patients having surgery or a procedure may have no more than 2 support people in the waiting area - these visitors may rotate in the visitor waiting room.   Due to an increase in RSV and influenza rates and associated hospitalizations, children ages 21 and under may not visit patients in [facility] hospitals. If the patient needs to stay at the hospital during part of their recovery, the visitor guidelines for inpatient rooms apply.  PRE-OP VISITATION  Pre-op nurse will coordinate an appropriate time for 1 support person to accompany the patient in pre-op.  This support person may not rotate.  This visitor will be contacted when the time is appropriate for the visitor to come back in the pre-op area.  Please refer to the [facility] website for the visitor guidelines for Inpatients (after your surgery is over and you are in a regular room).  You are not required to quarantine at this time prior to your surgery. However, you must do this: Hand Hygiene often Do NOT share personal items Notify your provider if you are in close contact with someone who has COVID or you develop fever 100.4 or greater, new onset of sneezing, cough, sore throat, shortness of breath or body aches.  If you test positive for Covid or have been in contact with anyone that has tested positive in the last 10 days please notify you surgeon.    Your procedure is scheduled on:  FRIDAY   May 30, 2023  Report to [facility] Main Entrance: Le[facility]sen entrance where the [employer] is available.   Report to admitting at:  08:00   AM  Call this number if you have any questions or problems the morning of surgery [phone]  Do not eat food after Midnight the night prior to your surgery/procedure.  After Midnight you may have the following liquids until   07:30 AM DAY OF SURGERY  Clear Liquid Diet Water Black Coffee (sugar ok, NO MILK/CREAM OR CREAMERS)  Tea (sugar ok, NO  MILK/CREAM OR CREAMERS) regular and decaf                             Plain Jell-O  with no fruit (NO RED)                                           Fruit ices (not with fruit pulp, NO RED)                                     Popsicles (NO RED)                                                                  Juice: NO CITRUS JUICES: only apple, WHITE grape, WHITE cranberry Sports drinks like Gatorade or Powerade (NO RED)                   The day of surgery:  Drink ONE (1) Pre-Surgery Clear Ensure at  07:30 AM the morning of  surgery. Drink in one sitting. Do not sip.  This drink was given to you during your hospital pre-op appointment visit. Nothing else to drink after completing the Pre-Surgery Clear Ensure : No candy, chewing gum or throat lozenges.    FOLLOW ANY ADDITIONAL PRE OP INSTRUCTIONS YOU RECEIVED FROM YOUR SURGEON'S OFFICE!!!   Oral Hygiene is also important to reduce your risk of infection.        Remember - BRUSH YOUR TEETH THE MORNING OF SURGERY WITH YOUR REGULAR TOOTHPASTE  Do NOT smoke after Midnight the night before surgery.  STOP TAKING all Vitamins, Herbs and supplements 1 week before your surgery.   Take ONLY these medicines the morning of surgery with A SIP OF WATER: Amlodipine, gabapentin.  You may take EITHER Tylenol OR Tramadol OR Hydrocodone APAP if needed depending on pain level,  You may take esomeprazole if needed.                     You may not have any metal on your body including hair pins, jewelry, and body piercing  Do not wear make-up, lotions, powders, perfumes  or deodorant  Do not wear nail polish including gel and S&S, artificial / acrylic nails, or any other type of covering on natural nails including finger and toenails. If you have artificial nails, gel coating, etc., that needs to be removed by a nail salon, Please have this removed prior to surgery. Not doing so may mean that your surgery could be cancelled or delayed if the Surgeon or  anesthesia staff feels like they are unable to monitor you safely.   Do not shave 48 hours prior to surgery to avoid nicks in your skin which may contribute to postoperative infections.   Contacts, Hearing Aids, dentures or bridgework may not be worn into surgery. DENTURES WILL BE REMOVED PRIOR TO SURGERY PLEASE DO NOT APPLY "Poly grip" OR ADHESIVES!!!  You may bring a small overnight bag with you on the day of surgery, only pack items that are not valuable. [facility] IS NOT RESPONSIBLE   FOR VALUABLES THAT ARE LOST OR STOLEN.   Do not bring your home medications to the hospital. The Pharmacy will dispense medications listed on your medication list to you during your admission in the Hospital.  Special Instructions: Bring a copy of your healthcare power of attorney and living will documents the day of surgery, if you wish to have them scanned into your [facility] Records- EPIC  Please read over the following fact sheets you were given: IF YOU HAVE QUESTIONS ABOUT YOUR PRE-OP INSTRUCTIONS, PLEASE CALL [phone].     Pre-operative 5 CHG Bath Instructions   You can play a key role in reducing the risk of infection after surgery. Your skin needs to be as free of germs as possible. You can reduce the number of germs on your skin by washing with CHG (chlorhexidine gluconate) soap before surgery. CHG is an antiseptic soap that kills germs and continues to kill germs even after washing.   DO NOT use if you have an allergy to chlorhexidine/CHG or antibacterial soaps. If your skin becomes reddened or irritated, stop using the CHG and notify one of our RNs at [phone]  Please shower with the CHG soap starting 4 days before surgery using the following schedule: START SHOWERS ON MONDAY  May 26, 2023  Please keep in  mind the following:  DO NOT shave, including legs and underarms, starting the day of your first shower.   You may shave your face at any point before/day of surgery.   Place clean sheets on your bed the day you start using CHG soap. Use a clean washcloth (not used since being washed) for each shower. DO NOT sleep with pets once you start using the CHG.   CHG Shower Instructions:  If you choose to wash your hair and private area, wash first with your normal shampoo/soap.  After you use shampoo/soap, rinse your hair and body thoroughly to remove shampoo/soap residue.  Turn the water OFF and apply about 3 tablespoons (45 ml) of CHG soap to a CLEAN washcloth.  Apply CHG soap ONLY FROM YOUR NECK DOWN TO YOUR TOES (washing for 3-5 minutes)  DO NOT use CHG soap on face, private areas, open wounds, or sores.  Pay special attention to the area where your surgery is being performed.  If you are having back surgery, having someone wash your back for you may be helpful.  Wait 2 minutes after CHG soap is applied, then you may rinse off the CHG soap.  Pat dry with a clean towel  Put on clean clothes/pajamas   If you choose to wear lotion, please use ONLY the CHG-compatible lotions on the back of this paper.     Additional instructions for the day of surgery: DO NOT APPLY any lotions, deodorants, cologne, or perfumes.   Put on clean/comfortable clothes.  Brush your teeth.  Ask your nurse before applying any prescription medications to the skin.      CHG Compatible Lotions   Aveeno Moisturizing lotion  Cetaphil Moisturizing Cream  Cetaphil Moisturizing Lotion  Clairol Herbal Essence Moisturizing Lotion, Dry Skin  Clairol Herbal Essence Moisturizing Lotion, Extra Dry Skin  Clairol Herbal Essence Moisturizing Lotion, Normal Skin  Curel Age Defying Therapeutic Moisturizing Lotion with Alpha Hydroxy  Curel Extreme Care Body Lotion  Curel Soothing Hands Moisturizing Hand Lotion  Curel  Therapeutic Moisturizing Cream, Fragrance-Free  Curel Therapeutic Moisturizing Lotion, Fragrance-Free  Curel Therapeutic Moisturizing Lotion, Original Formula  Eucerin Daily Replenishing Lotion  Eucerin Dry Skin Therapy Plus Alpha Hydroxy Crme  Eucerin Dry Skin Therapy Plus Alpha Hydroxy Lotion  Eucerin Original Crme  Eucerin Original Lotion  Eucerin Plus Crme Eucerin Plus Lotion  Eucerin TriLipid Replenishing Lotion  Keri Anti-Bacterial Hand Lotion  Keri Deep Conditioning Original Lotion Dry Skin Formula Softly Scented  Keri Deep Conditioning Original Lotion, Fragrance Free Sensitive Skin Formula  Keri Lotion Fast Absorbing Fragrance Free Sensitive Skin Formula  Keri Lotion Fast Absorbing Softly Scented Dry Skin Formula  Keri Original Lotion  Keri Skin Renewal Lotion Keri Silky Smooth Lotion  Keri Silky Smooth Sensitive Skin Lotion  Nivea Body Creamy Conditioning Oil  Nivea Body Extra Enriched Lotion  Nivea Body Original Lotion  Nivea Body Sheer Moisturizing Lotion Nivea Crme  Nivea Skin Firming Lotion  NutraDerm 30 Skin Lotion  NutraDerm Skin Lotion  NutraDerm Therapeutic Skin Cream  NutraDerm Therapeutic Skin Lotion  ProShield Protective Hand Cream  Provon moisturizing lotion   FAILURE TO FOLLOW THESE INSTRUCTIONS MAY RESULT IN THE CANCELLATION OF YOUR SURGERY  PATIENT SIGNATURE_________________________________  NURSE SIGNATURE__________________________________  ________________________________________________________________________       Rogelia Mire    An incentive spirometer is a tool that can help keep your lungs clear and active. This tool measures how well you are filling your lungs with each breath. Taking  long deep breaths may help reverse or decrease the chance of developing breathing (pulmonary) problems (especially infection) following: A long period of time when you are unable to move or be active. BEFORE THE PROCEDURE  If the spirometer  includes an indicator to show your best effort, your nurse or respiratory therapist will set it to a desired goal. If possible, sit up straight or lean slightly forward. Try not to slouch. Hold the incentive spirometer in an upright position. INSTRUCTIONS FOR USE  Sit on the edge of your bed if possible, or sit up as far as you can in bed or on a chair. Hold the incentive spirometer in an upright position. Breathe out normally. Place the mouthpiece in your mouth and seal your lips tightly around it. Breathe in slowly and as deeply as possible, raising the piston or the ball toward the top of the column. Hold your breath for 3-5 seconds or for as long as possible. Allow the piston or ball to fall to the bottom of the column. Remove the mouthpiece from your mouth and breathe out normally. Rest for a few seconds and repeat Steps 1 through 7 at least 10 times every 1-2 hours when you are awake. Take your time and take a few normal breaths between deep breaths. The spirometer may include an indicator to show your best effort. Use the indicator as a goal to work toward during each repetition. After each set of 10 deep breaths, practice coughing to be sure your lungs are clear. If you have an incision (the cut made at the time of surgery), support your incision when coughing by placing a pillow or rolled up towels firmly against it. Once you are able to get out of bed, walk around indoors and cough well. You may stop using the incentive spirometer when instructed by your caregiver.  RISKS AND COMPLICATIONS Take your time so you do not get dizzy or light-headed. If you are in pain, you may need to take or ask for pain medication before doing incentive spirometry. It is harder to take a deep breath if you are having pain. AFTER USE Rest and breathe slowly and easily. It can be helpful to keep track of a log of your progress. Your caregiver can provide you with a simple table to help with this. If you are  using the spirometer at home, follow these instructions: SEEK MEDICAL CARE IF:  You are having difficultly using the spirometer. You have trouble using the spirometer as often as instructed. Your pain medication is not giving enough relief while using the spirometer. You develop fever of 100.5 F (38.1 C) or higher.                                                                                                    SEEK IMMEDIATE MEDICAL CARE IF:  You cough up bloody sputum that had not been present before. You develop fever of 102 F (38.9 C) or greater. You develop worsening pain at or near the incision site. MAKE SURE YOU:  Understand these instructions. Will watch your condition.  Will get help right away if you are not doing well or get worse. Document Released: 07/08/2006 Document Revised: 05/20/2011 Document Reviewed: 09/08/2006 [facility] Patient Information 2014 [place], [place].       If you would like to see a video about joint replacement:   [email or web address]

## 2023-05-21 ENCOUNTER — Encounter (HOSPITAL_COMMUNITY)
Admission: RE | Admit: 2023-05-21 | Discharge: 2023-05-21 | Disposition: A | Discharge status: Home / Self Care | Payer: Medicare Other | Source: Ambulatory Visit | Attending: Orthopedic Surgery | Admitting: Orthopedic Surgery

## 2023-05-21 ENCOUNTER — Encounter (HOSPITAL_COMMUNITY): Payer: Self-pay

## 2023-05-21 ENCOUNTER — Other Ambulatory Visit: Payer: Self-pay

## 2023-05-21 VITALS — BP 152/76 | HR 76 | Temp 98.1°F | Resp 20 | Ht 61.0 in | Wt 178.0 lb

## 2023-05-21 DIAGNOSIS — I351 Nonrheumatic aortic (valve) insufficiency: Secondary | ICD-10-CM | POA: Insufficient documentation

## 2023-05-21 DIAGNOSIS — Z8673 Personal history of transient ischemic attack (TIA), and cerebral infarction without residual deficits: Secondary | ICD-10-CM | POA: Insufficient documentation

## 2023-05-21 DIAGNOSIS — M1711 Unilateral primary osteoarthritis, right knee: Secondary | ICD-10-CM | POA: Diagnosis not present

## 2023-05-21 DIAGNOSIS — Z01818 Encounter for other preprocedural examination: Secondary | ICD-10-CM | POA: Diagnosis present

## 2023-05-21 DIAGNOSIS — Z7982 Long term (current) use of aspirin: Secondary | ICD-10-CM | POA: Insufficient documentation

## 2023-05-21 DIAGNOSIS — Z9889 Other specified postprocedural states: Secondary | ICD-10-CM | POA: Diagnosis not present

## 2023-05-21 DIAGNOSIS — Z01812 Encounter for preprocedural laboratory examination: Secondary | ICD-10-CM | POA: Insufficient documentation

## 2023-05-21 DIAGNOSIS — I10 Essential (primary) hypertension: Secondary | ICD-10-CM | POA: Diagnosis not present

## 2023-05-21 HISTORY — DX: Cerebral infarction, unspecified: I63.9

## 2023-05-21 LAB — BASIC METABOLIC PANEL
Anion gap: 11 (ref 5–15)
BUN: 15 mg/dL (ref 8–23)
CO2: 25 mmol/L (ref 22–32)
Calcium: 9.3 mg/dL (ref 8.9–10.3)
Chloride: 103 mmol/L (ref 98–111)
Creatinine, Ser: 0.6 mg/dL (ref 0.44–1.00)
GFR, Estimated: >60 mL/min (ref >60)
Glucose, Bld: 98 mg/dL (ref 70–99)
Potassium: 3.8 mmol/L (ref 3.5–5.1)
Sodium: 139 mmol/L (ref 135–145)

## 2023-05-21 LAB — CBC
HCT: 44.8 % (ref 36.0–46.0)
Hemoglobin: 14.5 g/dL (ref 12.0–15.0)
MCH: 30.7 pg (ref 26.0–34.0)
MCHC: 32.4 g/dL (ref 30.0–36.0)
MCV: 94.7 fL (ref 80.0–100.0)
Platelets: 237 10*3/uL (ref 150–400)
RBC: 4.73 MIL/uL (ref 3.87–5.11)
RDW: 11.8 % (ref 11.5–15.5)
WBC: 10.5 10*3/uL (ref 4.0–10.5)
nRBC: 0 % (ref 0.0–0.2)

## 2023-05-21 LAB — SURGICAL PCR SCREEN
MRSA, PCR: NEGATIVE
Staphylococcus aureus: NEGATIVE

## 2023-05-22 ENCOUNTER — Ambulatory Visit

## 2023-05-22 DIAGNOSIS — N3281 Overactive bladder: Secondary | ICD-10-CM | POA: Diagnosis not present

## 2023-05-22 NOTE — Progress Notes (Signed)
 Anesthesia Chart Review   Case: [id number] Date/Time: 05/30/23 1335   Procedure: ARTHROPLASTY, KNEE, TOTAL (Right: Knee) - general, place in flip room please   Anesthesia type: Spinal   Pre-op diagnosis: Right knee osteoarthritis   Location: [facility] ROOM 06 / [facility]   Surgeons: Beverely Low, MD       DISCUSSION:77 y.o. never smoker with h/o HTN, left carotid artery stenosis, status post left carotid endarterectomy 2022, TIA, mild AS, right knee OA scheduled for above procedure 05/30/2023 with Dr. Beverely Low.   Pt last seen by cardiology 03/25/2023. Per OV note, "According to the Revised Cardiac Risk Index (RCRI), her Perioperative Risk of Major Cardiac Event is (%): 6.6   Her Functional Capacity in METs is: 4.61 according to the Duke Activity Status Index (DASI).  This is due to chronic knee pain not allowing her to be as physically active and mobile as she would like to be.   Although not specifically requested, since the patient has stated that she is taking a baby aspirin daily, from a cardiac perspective if it is necessary, she may hold the baby aspirin for 7 days prior to procedure.  Will defer to surgeon if this is required."  VS: BP (!) 152/76 Comment: right arm sitting,  pt has white coat syndrome  Pulse 76   Temp 36.7 C (Oral)   Resp 20   Ht 5\' 1"  (1.549 m)   Wt 80.7 kg   SpO2 98%   BMI 33.63 kg/m   PROVIDERS: Carylon Perches, MD is PCP   Cardiologist - Weston Brass, MD  LABS: Labs reviewed: Acceptable for surgery. (all labs ordered are listed, but only abnormal results are displayed)  Labs Reviewed  SURGICAL PCR SCREEN  BASIC METABOLIC PANEL  CBC     IMAGES:   EKG:   CV: Echo 12/10/2021 1. Left ventricular ejection fraction, by estimation, is 60 to 65%. The  left ventricle has normal function. The left ventricle has no regional  wall motion abnormalities. Left ventricular diastolic parameters were  normal.   2. Right ventricular systolic function is normal.  The right ventricular  size is normal.   3. Left atrial size was mildly dilated.   4. The mitral valve is abnormal. Trivial mitral valve regurgitation. No  evidence of mitral stenosis.   5. Gradients are lower than those recorded on 06/11/21 mean14 peak 24.6  mmHg at that time. The aortic valve is tricuspid. There is moderate  calcification of the aortic valve. There is moderate thickening of the  aortic valve. Aortic valve regurgitation is  not visualized. Mild aortic valve stenosis.   6. The inferior vena cava is normal in size with greater than 50%  respiratory variability, suggesting right atrial pressure of 3 mmHg.   Past Medical History:  Diagnosis Date   Arthritis    Bacterial overgrowth syndrome    Positive HBT   Carotid artery occlusion    GERD (gastroesophageal reflux disease)    Heart murmur    mild AS 03/21/20 echo   Hypercholesteremia    Hypertension    Stroke (HCC)    hx TIA   TIA (transient ischemic attack)     Past Surgical History:  Procedure Laterality Date   ABDOMINAL HYSTERECTOMY     BACTERIAL OVERGROWTH TEST N/A 01/07/2013   Procedure: BACTERIAL OVERGROWTH TEST;  Surgeon: Corbin Ade, MD;  Location: [facility];  Service: Endoscopy;  Laterality: N/A;  7:30   Bladder Tack     x  2   CATARACT EXTRACTION W/ INTRAOCULAR LENS IMPLANT Bilateral    COLONOSCOPY  09/2005   Dr. Lovell Sheehan: normal. Due for screening 2017   ENDARTERECTOMY Left 03/27/2020   Procedure: LEFT CAROTID ENDARTERECTOMY;  Surgeon: Sherren Kerns, MD;  Location: [facility] OR;  Service: Vascular;  Laterality: Left;   ESOPHAGOGASTRODUODENOSCOPY N/A 10/12/2012   RMR: Small inlet patch.  Small hiatal hernia.  Status post gastric biopsy, negative H.pylori   ESOPHAGOGASTRODUODENOSCOPY (EGD) WITH ESOPHAGEAL DILATION  02/2010   Dr. Jena Gauss: non-critical Schatzki's ring s/p dilation with 5 F dilator, small hiatal hernia, otherwise normal   FRACTURE SURGERY     left wrist   IR ANGIO EXTERNAL CAROTID SEL  EXT CAROTID BILAT MOD SED  03/02/2019   IR ANGIO INTRA EXTRACRAN SEL INTERNAL CAROTID BILAT MOD SED  03/02/2019   IR ANGIO VERTEBRAL SEL VERTEBRAL BILAT MOD SED  03/02/2019   IR US GUIDE VASC ACCESS RIGHT  03/02/2019   KNEE ARTHROSCOPY     Bilateral (torn meniscus)   ROTATOR CUFF REPAIR Left    TONSILLECTOMY     TOTAL KNEE ARTHROPLASTY Left 09/20/2022   Procedure: TOTAL KNEE ARTHROPLASTY;  Surgeon: Beverely Low, MD;  Location[facility];  Service: Orthopedics;  Laterality: Left;  general and spinal   TUBAL LIGATION      MEDICATIONS:  acetaminophen (TYLENOL) 500 MG tablet   amLODipine (NORVASC) 5 MG tablet   aspirin EC 81 MG tablet   atorvastatin (LIPITOR) 40 MG tablet   B Complex-C (B-COMPLEX WITH VITAMIN C) tablet   diclofenac Sodium (VOLTAREN) 1 % GEL   diphenhydramine-acetaminophen (TYLENOL PM) 25-500 MG TABS tablet   esomeprazole (NEXIUM) 20 MG capsule   ezetimibe (ZETIA) 10 MG tablet   gabapentin (NEURONTIN) 100 MG capsule   HYDROcodone-acetaminophen (NORCO/VICODIN) 5-325 MG tablet   traMADol (ULTRAM) 50 MG tablet   No current facility-administered medications for this encounter.    Jodell Cipro Ward, PA-C [facility]Surgical Testing [phone]

## 2023-05-22 NOTE — Progress Notes (Signed)
 PTNS  Session # 12 of 12  PTNS was prescribed for the patient's Overactive Bladder symptoms. The voiding diary and/or patients symptoms were reviewed prior to the start of treatment.   Patient Goals: Reduced urgency, reduced frequency, No accidents, void every 4 hours   Health & Social Factors: No change Caffeine: coffee 2 /day Alcohol: no Daytime voids #per day: 9-10 Night-time voids #per night: 2 Urgency: mild Incontinence Episodes #per day: 2 Treatment Plan/Comments: Reduced fluids reduce caffeine   Ankle used: left Treatment Setting: 9  Feeling/ Response: tingle sensation in ankle Patient also states she doesn't typically feel a sensation anywhere other than her ankle   PTNS Treatment The needle electrode was inserted into the lower, inner aspect of patient's leg. The surface electrode was placed on the inside arch of the foot on the treatment leg.The lead set was connected to the stimulator, and the needle electrode clip was connected to the needle electrode. The stimulator that produces an adjustable electrical pulse that travels to the sacral nerve plexus via the tibial nerve was increased until a patient response was observed.    Performed By: Alfonse Spruce CMA  Follow Up: As scheduled

## 2023-05-22 NOTE — Anesthesia Preprocedure Evaluation (Addendum)
 Anesthesia Evaluation  Patient identified by MRN, date of birth, ID band Patient awake    Reviewed: Allergy & Precautions, H&P , NPO status , Patient's Chart, lab work & pertinent test results  Airway Mallampati: II  TM Distance: >3 FB Neck ROM: Full    Dental no notable dental hx.    Pulmonary neg pulmonary ROS   Pulmonary exam normal breath sounds clear to auscultation       Cardiovascular hypertension, Normal cardiovascular exam+ Valvular Problems/Murmurs AS  Rhythm:Regular Rate:Normal     Neuro/Psych TIA negative psych ROS   GI/Hepatic Neg liver ROS,GERD  ,,  Endo/Other  negative endocrine ROS    Renal/GU negative Renal ROS  negative genitourinary   Musculoskeletal  (+) Arthritis ,    Abdominal   Peds negative pediatric ROS (+)  Hematology negative hematology ROS (+)   Anesthesia Other Findings   Reproductive/Obstetrics negative OB ROS                             Anesthesia Physical Anesthesia Plan  ASA: 3  Anesthesia Plan: Spinal and Regional   Post-op Pain Management: Regional block*   Induction: Intravenous  PONV Risk Score and Plan: 2 and Ondansetron and Dexamethasone  Airway Management Planned: Natural Airway and Simple Face Mask  Additional Equipment: None  Intra-op Plan:   Post-operative Plan: Extubation in OR  Informed Consent: I have reviewed the patients History and Physical, chart, labs and discussed the procedure including the risks, benefits and alternatives for the proposed anesthesia with the patient or authorized representative who has indicated his/her understanding and acceptance.     Dental advisory given  Plan Discussed with: CRNA  Anesthesia Plan Comments: (See PAT note 05/21/2023: 78 y.o. never smoker with h/o HTN, left carotid artery stenosis, status post left carotid endarterectomy 2022, TIA, mild AS, right knee OA scheduled for above  procedure 05/30/2023 with Dr. Beverely Low. )       Anesthesia Quick Evaluation

## 2023-05-26 ENCOUNTER — Ambulatory Visit: Payer: Medicare Other

## 2023-05-30 ENCOUNTER — Ambulatory Visit (HOSPITAL_COMMUNITY): Payer: Self-pay | Admitting: Physician Assistant

## 2023-05-30 ENCOUNTER — Encounter (HOSPITAL_COMMUNITY): Payer: Self-pay | Admitting: Orthopedic Surgery

## 2023-05-30 ENCOUNTER — Encounter (HOSPITAL_COMMUNITY)
Admission: RE | Disposition: A | Discharge status: Home / Self Care | Payer: Self-pay | Source: Home / Self Care | Attending: Orthopedic Surgery

## 2023-05-30 ENCOUNTER — Ambulatory Visit (HOSPITAL_COMMUNITY)

## 2023-05-30 ENCOUNTER — Observation Stay (HOSPITAL_COMMUNITY)
Admission: RE | Admit: 2023-05-30 | Discharge: 2023-06-01 | Disposition: A | Discharge status: Home / Self Care | Payer: Medicare Other | Attending: Orthopedic Surgery | Admitting: Orthopedic Surgery

## 2023-05-30 ENCOUNTER — Other Ambulatory Visit: Payer: Self-pay

## 2023-05-30 DIAGNOSIS — I10 Essential (primary) hypertension: Secondary | ICD-10-CM

## 2023-05-30 DIAGNOSIS — Z79899 Other long term (current) drug therapy: Secondary | ICD-10-CM | POA: Insufficient documentation

## 2023-05-30 DIAGNOSIS — Z96651 Presence of right artificial knee joint: Principal | ICD-10-CM

## 2023-05-30 DIAGNOSIS — Z96652 Presence of left artificial knee joint: Secondary | ICD-10-CM | POA: Insufficient documentation

## 2023-05-30 DIAGNOSIS — Z8673 Personal history of transient ischemic attack (TIA), and cerebral infarction without residual deficits: Secondary | ICD-10-CM | POA: Diagnosis not present

## 2023-05-30 DIAGNOSIS — M1711 Unilateral primary osteoarthritis, right knee: Secondary | ICD-10-CM

## 2023-05-30 DIAGNOSIS — M170 Bilateral primary osteoarthritis of knee: Principal | ICD-10-CM | POA: Insufficient documentation

## 2023-05-30 HISTORY — PX: TOTAL KNEE ARTHROPLASTY: SHX125

## 2023-05-30 SURGERY — ARTHROPLASTY, KNEE, TOTAL
Anesthesia: Regional | Site: Knee | Laterality: Right

## 2023-05-30 MED ORDER — B COMPLEX-C PO TABS
1.0000 | ORAL_TABLET | Freq: Every day | ORAL | Status: DC
  Administered 2023-05-31 – 2023-06-01 (×2): 1 via ORAL
  Filled 2023-05-30 (×2): qty 1

## 2023-05-30 MED ORDER — BUPIVACAINE-EPINEPHRINE (PF) 0.25% -1:200000 IJ SOLN
INTRAMUSCULAR | Status: AC
  Filled 2023-05-30: qty 30

## 2023-05-30 MED ORDER — CEFAZOLIN SODIUM-DEXTROSE 2-4 GM/100ML-% IV SOLN
2.0000 g | INTRAVENOUS | Status: AC
  Administered 2023-05-30: 2 g via INTRAVENOUS
  Filled 2023-05-30: qty 100

## 2023-05-30 MED ORDER — ACETAMINOPHEN 10 MG/ML IV SOLN: 1000.0000 mg | Freq: Once | INTRAVENOUS | Status: DC | PRN

## 2023-05-30 MED ORDER — PHENYLEPHRINE HCL-NACL 20-0.9 MG/250ML-% IV SOLN
INTRAVENOUS | Status: DC | PRN
  Administered 2023-05-30: 30 ug/min via INTRAVENOUS

## 2023-05-30 MED ORDER — METHOCARBAMOL 500 MG PO TABS: 500.0000 mg | ORAL_TABLET | Freq: Three times a day (TID) | ORAL | 1 refills | Status: DC | PRN

## 2023-05-30 MED ORDER — ASPIRIN 81 MG PO TBEC: 81.0000 mg | DELAYED_RELEASE_TABLET | Freq: Two times a day (BID) | ORAL | 0 refills | Status: DC

## 2023-05-30 MED ORDER — DIPHENHYDRAMINE-APAP (SLEEP) 25-500 MG PO TABS
1.0000 | ORAL_TABLET | Freq: Every day | ORAL | Status: DC
Start: 2023-05-30 — End: 2023-05-30

## 2023-05-30 MED ORDER — CHLORHEXIDINE GLUCONATE 0.12 % MT SOLN
15.0000 mL | Freq: Once | OROMUCOSAL | Status: AC
  Administered 2023-05-30: 15 mL via OROMUCOSAL

## 2023-05-30 MED ORDER — BUPIVACAINE IN DEXTROSE 0.75-8.25 % IT SOLN
INTRATHECAL | Status: DC | PRN
  Administered 2023-05-30: 1.6 mL via INTRATHECAL

## 2023-05-30 MED ORDER — POLYETHYLENE GLYCOL 3350 17 G PO PACK: 17.0000 g | PACK | Freq: Every day | ORAL | Status: DC | PRN

## 2023-05-30 MED ORDER — TRANEXAMIC ACID-NACL 1000-0.7 MG/100ML-% IV SOLN
INTRAVENOUS | Status: AC
  Filled 2023-05-30: qty 100

## 2023-05-30 MED ORDER — FENTANYL CITRATE (PF) 100 MCG/2ML IJ SOLN
INTRAMUSCULAR | Status: DC | PRN
  Administered 2023-05-30 (×2): 50 ug via INTRAVENOUS

## 2023-05-30 MED ORDER — METHOCARBAMOL 500 MG PO TABS
500.0000 mg | ORAL_TABLET | Freq: Four times a day (QID) | ORAL | Status: DC | PRN
  Administered 2023-05-30 – 2023-06-01 (×5): 500 mg via ORAL
  Filled 2023-05-30 (×4): qty 1

## 2023-05-30 MED ORDER — ONDANSETRON HCL 4 MG/2ML IJ SOLN: 4.0000 mg | Freq: Four times a day (QID) | INTRAMUSCULAR | Status: DC | PRN

## 2023-05-30 MED ORDER — CEFAZOLIN SODIUM-DEXTROSE 2-4 GM/100ML-% IV SOLN
2.0000 g | Freq: Four times a day (QID) | INTRAVENOUS | Status: AC
  Administered 2023-05-30 – 2023-05-31 (×2): 2 g via INTRAVENOUS
  Filled 2023-05-30 (×2): qty 100

## 2023-05-30 MED ORDER — DICLOFENAC SODIUM 1 % EX GEL: 2.0000 g | Freq: Every day | CUTANEOUS | Status: DC | PRN

## 2023-05-30 MED ORDER — GABAPENTIN 100 MG PO CAPS
100.0000 mg | ORAL_CAPSULE | Freq: Every day | ORAL | Status: DC
  Administered 2023-05-30 – 2023-06-01 (×3): 100 mg via ORAL
  Filled 2023-05-30 (×3): qty 1

## 2023-05-30 MED ORDER — ONDANSETRON HCL 4 MG PO TABS: 4.0000 mg | ORAL_TABLET | Freq: Three times a day (TID) | ORAL | 1 refills | Status: DC | PRN

## 2023-05-30 MED ORDER — HYDROCODONE-ACETAMINOPHEN 5-325 MG PO TABS
ORAL_TABLET | ORAL | Status: AC
  Filled 2023-05-30: qty 1

## 2023-05-30 MED ORDER — LACTATED RINGERS IV SOLN: INTRAVENOUS | Status: DC

## 2023-05-30 MED ORDER — SODIUM CHLORIDE 0.9 % IR SOLN
Status: DC | PRN
  Administered 2023-05-30: 1000 mL

## 2023-05-30 MED ORDER — ACETAMINOPHEN 500 MG PO TABS
500.0000 mg | ORAL_TABLET | Freq: Every day | ORAL | Status: DC
  Administered 2023-05-30 – 2023-05-31 (×2): 500 mg via ORAL
  Filled 2023-05-30 (×2): qty 1

## 2023-05-30 MED ORDER — DIPHENHYDRAMINE HCL 25 MG PO CAPS
25.0000 mg | ORAL_CAPSULE | Freq: Every day | ORAL | Status: DC
  Administered 2023-05-30 – 2023-05-31 (×2): 25 mg via ORAL
  Filled 2023-05-30 (×2): qty 1

## 2023-05-30 MED ORDER — ONDANSETRON HCL 4 MG/2ML IJ SOLN
INTRAMUSCULAR | Status: DC | PRN
Start: 2023-05-30 — End: 2023-05-30
  Administered 2023-05-30: 4 mg via INTRAVENOUS

## 2023-05-30 MED ORDER — FENTANYL CITRATE PF 50 MCG/ML IJ SOSY: 25.0000 ug | PREFILLED_SYRINGE | INTRAMUSCULAR | Status: DC | PRN

## 2023-05-30 MED ORDER — SODIUM CHLORIDE (PF) 0.9 % IJ SOLN
INTRAMUSCULAR | Status: DC | PRN
  Administered 2023-05-30: 80 mL

## 2023-05-30 MED ORDER — SODIUM CHLORIDE 0.9% FLUSH
3.0000 mL | Freq: Two times a day (BID) | INTRAVENOUS | Status: DC
  Administered 2023-05-30 – 2023-06-01 (×4): 10 mL via INTRAVENOUS

## 2023-05-30 MED ORDER — TRAMADOL HCL 50 MG PO TABS: 50.0000 mg | ORAL_TABLET | Freq: Four times a day (QID) | ORAL | Status: DC | PRN

## 2023-05-30 MED ORDER — ACETAMINOPHEN 325 MG PO TABS: 325.0000 mg | ORAL_TABLET | Freq: Four times a day (QID) | ORAL | Status: DC | PRN

## 2023-05-30 MED ORDER — TRANEXAMIC ACID-NACL 1000-0.7 MG/100ML-% IV SOLN
1000.0000 mg | INTRAVENOUS | Status: AC
  Administered 2023-05-30: 1000 mg via INTRAVENOUS
  Filled 2023-05-30: qty 100

## 2023-05-30 MED ORDER — PROPOFOL 500 MG/50ML IV EMUL
INTRAVENOUS | Status: DC | PRN
  Administered 2023-05-30: 100 ug/kg/min via INTRAVENOUS

## 2023-05-30 MED ORDER — HYDROCODONE-ACETAMINOPHEN 5-325 MG PO TABS: 1.0000 | ORAL_TABLET | Freq: Four times a day (QID) | ORAL | 0 refills | Status: DC | PRN

## 2023-05-30 MED ORDER — EZETIMIBE 10 MG PO TABS
10.0000 mg | ORAL_TABLET | Freq: Every day | ORAL | Status: DC
  Administered 2023-05-30 – 2023-06-01 (×3): 10 mg via ORAL
  Filled 2023-05-30 (×3): qty 1

## 2023-05-30 MED ORDER — TRANEXAMIC ACID-NACL 1000-0.7 MG/100ML-% IV SOLN
1000.0000 mg | Freq: Once | INTRAVENOUS | Status: AC
  Administered 2023-05-30: 1000 mg via INTRAVENOUS
  Filled 2023-05-30: qty 100

## 2023-05-30 MED ORDER — METOCLOPRAMIDE HCL 5 MG/ML IJ SOLN: 5.0000 mg | Freq: Three times a day (TID) | INTRAMUSCULAR | Status: DC | PRN

## 2023-05-30 MED ORDER — 0.9 % SODIUM CHLORIDE (POUR BTL) OPTIME
TOPICAL | Status: DC | PRN
  Administered 2023-05-30: 1000 mL

## 2023-05-30 MED ORDER — MORPHINE SULFATE (PF) 2 MG/ML IV SOLN
0.5000 mg | INTRAVENOUS | Status: DC | PRN
  Administered 2023-05-30: 1 mg via INTRAVENOUS
  Administered 2023-05-30 (×2): 0.5 mg via INTRAVENOUS
  Administered 2023-05-31 (×2): 1 mg via INTRAVENOUS
  Filled 2023-05-30 (×3): qty 1

## 2023-05-30 MED ORDER — PROPOFOL 10 MG/ML IV BOLUS
INTRAVENOUS | Status: DC | PRN
  Administered 2023-05-30: 30 mg via INTRAVENOUS
  Administered 2023-05-30: 20 mg via INTRAVENOUS

## 2023-05-30 MED ORDER — HYDROCODONE-ACETAMINOPHEN 5-325 MG PO TABS
1.0000 | ORAL_TABLET | ORAL | Status: DC | PRN
Start: 2023-05-30 — End: 2023-06-01
  Administered 2023-05-30: 1 via ORAL
  Administered 2023-05-31 – 2023-06-01 (×6): 2 via ORAL
  Filled 2023-05-30 (×6): qty 2

## 2023-05-30 MED ORDER — SODIUM CHLORIDE (PF) 0.9 % IJ SOLN
INTRAMUSCULAR | Status: AC
  Filled 2023-05-30: qty 50

## 2023-05-30 MED ORDER — ASPIRIN 81 MG PO CHEW
81.0000 mg | CHEWABLE_TABLET | Freq: Two times a day (BID) | ORAL | Status: DC
  Administered 2023-05-30 – 2023-06-01 (×4): 81 mg via ORAL
  Filled 2023-05-30 (×4): qty 1

## 2023-05-30 MED ORDER — DEXAMETHASONE SODIUM PHOSPHATE 10 MG/ML IJ SOLN
INTRAMUSCULAR | Status: DC | PRN
  Administered 2023-05-30: 5 mg via INTRAVENOUS

## 2023-05-30 MED ORDER — ROPIVACAINE HCL 5 MG/ML IJ SOLN
INTRAMUSCULAR | Status: DC | PRN
  Administered 2023-05-30: 20 mL via PERINEURAL

## 2023-05-30 MED ORDER — MORPHINE SULFATE (PF) 2 MG/ML IV SOLN
INTRAVENOUS | Status: AC
  Administered 2023-05-30: 1 mg via INTRAVENOUS
  Filled 2023-05-30: qty 1

## 2023-05-30 MED ORDER — DOCUSATE SODIUM 100 MG PO CAPS
100.0000 mg | ORAL_CAPSULE | Freq: Two times a day (BID) | ORAL | Status: DC
  Administered 2023-05-30 – 2023-06-01 (×4): 100 mg via ORAL
  Filled 2023-05-30 (×4): qty 1

## 2023-05-30 MED ORDER — PHENOL 1.4 % MT LIQD: 1.0000 | OROMUCOSAL | Status: DC | PRN

## 2023-05-30 MED ORDER — BUPIVACAINE LIPOSOME 1.3 % IJ SUSP
INTRAMUSCULAR | Status: AC
  Filled 2023-05-30: qty 20

## 2023-05-30 MED ORDER — METHOCARBAMOL 1000 MG/10ML IJ SOLN: 500.0000 mg | Freq: Four times a day (QID) | INTRAMUSCULAR | Status: DC | PRN

## 2023-05-30 MED ORDER — FENTANYL CITRATE (PF) 100 MCG/2ML IJ SOLN
INTRAMUSCULAR | Status: AC
  Filled 2023-05-30: qty 2

## 2023-05-30 MED ORDER — PROPOFOL 1000 MG/100ML IV EMUL
INTRAVENOUS | Status: AC
  Filled 2023-05-30: qty 100

## 2023-05-30 MED ORDER — ATORVASTATIN CALCIUM 40 MG PO TABS
40.0000 mg | ORAL_TABLET | Freq: Every day | ORAL | Status: DC
  Administered 2023-05-30 – 2023-05-31 (×2): 40 mg via ORAL
  Filled 2023-05-30 (×2): qty 1

## 2023-05-30 MED ORDER — PANTOPRAZOLE SODIUM 40 MG PO TBEC
40.0000 mg | DELAYED_RELEASE_TABLET | Freq: Every day | ORAL | Status: DC
  Administered 2023-05-30 – 2023-06-01 (×3): 40 mg via ORAL
  Filled 2023-05-30 (×3): qty 1

## 2023-05-30 MED ORDER — BUPIVACAINE LIPOSOME 1.3 % IJ SUSP: 20.0000 mL | Freq: Once | INTRAMUSCULAR | Status: DC

## 2023-05-30 MED ORDER — SODIUM CHLORIDE 0.9% FLUSH
3.0000 mL | INTRAVENOUS | Status: DC | PRN
  Administered 2023-05-30: 10 mL via INTRAVENOUS

## 2023-05-30 MED ORDER — ORAL CARE MOUTH RINSE: 15.0000 mL | Freq: Once | OROMUCOSAL | Status: AC

## 2023-05-30 MED ORDER — DROPERIDOL 2.5 MG/ML IJ SOLN: 0.6250 mg | Freq: Once | INTRAMUSCULAR | Status: DC | PRN

## 2023-05-30 MED ORDER — WATER FOR IRRIGATION, STERILE IR SOLN
Status: DC | PRN
  Administered 2023-05-30: 1000 mL

## 2023-05-30 MED ORDER — PROPOFOL 10 MG/ML IV BOLUS
INTRAVENOUS | Status: AC
  Filled 2023-05-30: qty 20

## 2023-05-30 MED ORDER — FENTANYL CITRATE PF 50 MCG/ML IJ SOSY
25.0000 ug | PREFILLED_SYRINGE | INTRAMUSCULAR | Status: DC
  Administered 2023-05-30: 50 ug via INTRAVENOUS
  Filled 2023-05-30: qty 2

## 2023-05-30 MED ORDER — METOCLOPRAMIDE HCL 5 MG PO TABS: 5.0000 mg | ORAL_TABLET | Freq: Three times a day (TID) | ORAL | Status: DC | PRN

## 2023-05-30 MED ORDER — METHOCARBAMOL 500 MG PO TABS
ORAL_TABLET | ORAL | Status: AC
  Filled 2023-05-30: qty 1

## 2023-05-30 MED ORDER — ONDANSETRON HCL 4 MG PO TABS
4.0000 mg | ORAL_TABLET | Freq: Four times a day (QID) | ORAL | Status: DC | PRN
  Administered 2023-05-31: 4 mg via ORAL
  Filled 2023-05-30: qty 1

## 2023-05-30 MED ORDER — POVIDONE-IODINE 10 % EX SWAB: 2.0000 | Freq: Once | CUTANEOUS | Status: DC

## 2023-05-30 MED ORDER — AMLODIPINE BESYLATE 5 MG PO TABS
5.0000 mg | ORAL_TABLET | Freq: Every day | ORAL | Status: DC
  Administered 2023-05-31 – 2023-06-01 (×2): 5 mg via ORAL
  Filled 2023-05-30 (×2): qty 1

## 2023-05-30 MED ORDER — MENTHOL 3 MG MT LOZG: 1.0000 | LOZENGE | OROMUCOSAL | Status: DC | PRN

## 2023-05-30 MED ORDER — MIDAZOLAM HCL 2 MG/2ML IJ SOLN: 0.5000 mg | INTRAMUSCULAR | Status: DC

## 2023-05-30 SURGICAL SUPPLY — 45 items
ATTUNE MED DOME PAT 32 KNEE (Knees) IMPLANT
ATTUNE PSFEM RTSZ5 NARCEM KNEE (Femur) IMPLANT
ATTUNE PSRP INSR SZ5 6 KNEE (Insert) IMPLANT
BAG COUNTER SPONGE SURGICOUNT (BAG) IMPLANT
BAG ZIPLOCK 12X15 (MISCELLANEOUS) IMPLANT
BASE TIBIAL ROT PLAT SZ 5 KNEE (Knees) IMPLANT
BLADE SAG 18X100X1.27 (BLADE) ×1 IMPLANT
BLADE SAW SGTL 13X75X1.27 (BLADE) ×1 IMPLANT
BNDG ELASTIC 6X10 VLCR STRL LF (GAUZE/BANDAGES/DRESSINGS) ×1 IMPLANT
BNDG GAUZE DERMACEA FLUFF 4 (GAUZE/BANDAGES/DRESSINGS) ×1 IMPLANT
BOWL SMART MIX CTS (DISPOSABLE) ×1 IMPLANT
CEMENT HV SMART SET (Cement) ×2 IMPLANT
COVER SURGICAL LIGHT HANDLE (MISCELLANEOUS) ×1 IMPLANT
CUFF TRNQT CYL 34X4.125X (TOURNIQUET CUFF) ×1 IMPLANT
DRAPE INCISE IOBAN 66X45 STRL (DRAPES) IMPLANT
DRAPE SHEET LG 3/4 BI-LAMINATE (DRAPES) ×1 IMPLANT
DRAPE U-SHAPE 47X51 STRL (DRAPES) ×1 IMPLANT
DRSG ADAPTIC 3X8 NADH LF (GAUZE/BANDAGES/DRESSINGS) ×1 IMPLANT
DURAPREP 26ML APPLICATOR (WOUND CARE) ×1 IMPLANT
ELECT REM PT RETURN 15FT ADLT (MISCELLANEOUS) ×1 IMPLANT
GAUZE PAD ABD 8X10 STRL (GAUZE/BANDAGES/DRESSINGS) ×1 IMPLANT
GAUZE SPONGE 4X4 12PLY STRL (GAUZE/BANDAGES/DRESSINGS) ×1 IMPLANT
GLOVE BIOGEL PI IND STRL 7.5 (GLOVE) ×1 IMPLANT
GLOVE BIOGEL PI IND STRL 8.5 (GLOVE) ×1 IMPLANT
GLOVE ORTHO TXT STRL SZ7.5 (GLOVE) ×1 IMPLANT
GLOVE SURG ORTHO 8.5 STRL (GLOVE) ×1 IMPLANT
GOWN STRL REUS W/ TWL XL LVL3 (GOWN DISPOSABLE) ×2 IMPLANT
IMMOBILIZER KNEE 20 (SOFTGOODS) ×1 IMPLANT
IMMOBILIZER KNEE 20 THIGH 36 (SOFTGOODS) ×1 IMPLANT
KIT TURNOVER KIT A (KITS) IMPLANT
MANIFOLD NEPTUNE II (INSTRUMENTS) ×1 IMPLANT
NS IRRIG 1000ML POUR BTL (IV SOLUTION) ×1 IMPLANT
PACK TOTAL KNEE CUSTOM (KITS) ×1 IMPLANT
PIN STEINMAN FIXATION KNEE (PIN) IMPLANT
PROTECTOR NERVE ULNAR (MISCELLANEOUS) ×1 IMPLANT
SET HNDPC FAN SPRY TIP SCT (DISPOSABLE) ×1 IMPLANT
STRIP CLOSURE SKIN 1/2X4 (GAUZE/BANDAGES/DRESSINGS) ×2 IMPLANT
SUT MNCRL AB 3-0 PS2 18 (SUTURE) ×1 IMPLANT
SUT VIC AB 0 CT1 36 (SUTURE) ×1 IMPLANT
SUT VIC AB 1 CT1 36 (SUTURE) ×2 IMPLANT
SUT VIC AB 2-0 CT1 TAPERPNT 27 (SUTURE) ×2 IMPLANT
TIBIAL BASE ROT PLAT SZ 5 KNEE (Knees) ×1 IMPLANT
TRAY CATH INTERMITTENT SS 16FR (CATHETERS) ×1 IMPLANT
WATER STERILE IRR 1000ML POUR (IV SOLUTION) ×2 IMPLANT
YANKAUER SUCT BULB TIP NO VENT (SUCTIONS) ×1 IMPLANT

## 2023-05-30 NOTE — Op Note (Signed)
 [name], BOUCHARD MEDICAL RECORD NO: [id number] ACCOUNT NO: [id number] DATE OF BIRTH: 1945/12/05 FACILITY: [facility] LOCATION: [facility] PHYSICIAN: Almedia Balls. [name], MD  Operative Report   DATE OF PROCEDURE: 05/30/2023  PREOPERATIVE DIAGNOSIS:  Right knee end-stage arthritis.  POSTOPERATIVE DIAGNOSIS:  Right knee end-stage arthritis.  PROCEDURE PERFORMED:  Right total knee arthroplasty using DePuy Attune prosthesis.  ATTENDING SURGEON:  Almedia Balls. [name], MD  ASSISTANT:  Konrad Felix Dixon, [place], who was scrubbed during the entire procedure, and necessary for satisfactory completion of surgery.  ANESTHESIA:  General anesthesia was used plus adductor canal block.  ESTIMATED BLOOD LOSS:  Minimal.  FLUID REPLACEMENT:  1000 mL crystalloid.  COUNTS:  Instrument count was correct.  COMPLICATIONS:  None.  ANTIBIOTICS:  Perioperative antibiotics were given.  TOURNIQUET TIME:  80 minutes at 300 mmHg.  INDICATIONS:  The patient is a 78 year old female who presents with worsening right knee pain secondary to end-stage arthritis, bone-on-bone.  The patient has failed conservative management and desires operative treatment to eliminate pain and restore  function.  Informed consent obtained.  DESCRIPTION OF PROCEDURE:  After an adequate level of anesthesia was achieved, the patient was positioned supine on the operating room table.  The right leg was correctly identified and nonsterile tourniquet was placed on the proximal thigh.  Sterile  prep and drape of the right leg performed.  Timeout called verifying correct patient, correct site.  We elevated the leg and exsanguinated with an Esmarch bandage, inflating the tourniquet to 300 mmHg.  We then placed the knee in flexion and performed a  longitudinal midline incision with a 10 blade scalpel.  We used a fresh 10 blade scalpel for the medial parapatellar arthrotomy.  We then divided the lateral patellofemoral ligaments, everting the patella and  exposing the distal femur, which was devoid  of cartilage.  We entered the distal femur with a step-cut drill.  We then placed our intramedullary guide and resected 11 mm off the distal femur set at 5 degrees of valgus as the patient did have a flexion contracture preop.  We then sized the femur to  size 5 anterior down, performing anterior, posterior and chamfer cuts with a 4-in-1 block.  Next, we removed ACL, PCL, and meniscus tissues.  We subluxed the tibia anteriorly and used the external jig to cut the tibia 90 degrees perpendicular to the  long axis of the tibia with minimal posterior slope, resecting 2 mm off the affected medial side.  With our tibia cut performed, we placed a lamina spreader and removed the posterior femoral condyle osteophytes.  We released the posterior capsule.  We  then injected the posterior capsule with a combination of Marcaine, Exparel and saline for postop pain control.  Next, we checked our gaps, which were symmetric at 5 mm.  We then completed our tibia preparation for the size 5 tibia, externally rotating  the prosthesis as much as possible for patellar tracking purposes.  We used the modular drill and keel punch for the tibial preparation and then inserted the trial tibia.  Next, we went and cut the box for the 5 right narrow femur and then we trialed  with a 5 mm poly insert.  We were able to get the knee to full extension and had excellent flexion and stability.  Then we resurfaced the patella going from 23 mm thickness down to a 15 mm thickness.  We drilled lug holes for the 32 patellar button.  We  ranged the knee with  a no-touch technique and had excellent patellar tracking.  We removed all trial components, irrigating thoroughly.  We dried the bone well and then vacuum mixed high viscosity cement on the back table, cementing the components into  place with 5 tibia, 5 right narrow femur, placing the 5 mm poly in for cementing and placed the knee in extension to  compress the cement.  Then we used the 32 patellar button and used the patellar clamp to hold the patella compressed while the bone  cement set up.  Once all cement was hard on the back table, we removed excess cement with quarter inch curved osteotomes.  We felt like we could get a 6 mm poly in, so we selected the real size 5-6 mm poly and after inspecting the posterior aspect of the  knee and making sure that the knee was free of cement, we went ahead and placed the real size 5-6 mm poly in place on the tibia and then reduced the knee.  We had nice little pop as the medial condyle reduced.  We had excellent patellar tracking.  We  injected the anterior capsule with a combination of Marcaine, Exparel and saline and then closed the parapatellar arthrotomy with #1 Vicryl suture followed by 2-0 Vicryl subcutaneous closure and 4-0 Monocryl for skin.  Steri-Strips were applied followed  by a sterile dressing.  The patient tolerated the surgery well.   VAI D: 05/30/2023 4:53:02 pm T: 05/30/2023 9:26:00 pm  JOB: [id number]/ [id number]

## 2023-05-30 NOTE — Anesthesia Procedure Notes (Signed)
 Spinal  Patient location during procedure: OR Start time: 05/30/2023 3:20 PM End time: 05/30/2023 3:24 PM Staffing Performed: resident/CRNA  Resident/CRNA: Vanessa [place], CRNA Performed by: Vanessa [place], CRNA Authorized by: [place] Nation, MD   Preanesthetic Checklist Completed: patient identified, IV checked, site marked, risks and benefits discussed, surgical consent, monitors and equipment checked, pre-op evaluation and timeout performed Spinal Block Patient position: sitting Prep: DuraPrep Patient monitoring: heart rate, blood pressure, continuous pulse ox and cardiac monitor Approach: midline Location: L3-4 Needle Needle type: Pencan  Needle gauge: 24 G Needle length: 10 cm Needle insertion depth: 9 cm Assessment Events: CSF return

## 2023-05-30 NOTE — Discharge Instructions (Signed)
 Ice to the knee constantly.  Keep the incision covered and clean and dry for one week, then ok to get it wet in the shower. Keep the dressing on for one week, then remove and leave the incision open to air  Do exercise as instructed every hour, please to prevent stiffness.    DO NOT prop anything under the knee, it will make your knee stiff.  Prop under the ankle to encourage your knee to go straight.   Use the walker while you are up and around for balance.  Wear your support stockings 24/7 to prevent blood clots and take baby aspirin twice daily for 30 days also to prevent blood clots  Follow up with Dr [name] in two weeks in the office, call [phone] for appt  Please call Dr [name] (cell) [phone] with ANY questions or concerns  INSTRUCTIONS AFTER JOINT REPLACEMENT   Remove items at home which could result in a fall. This includes throw rugs or furniture in walking pathways ICE to the affected joint every three hours while awake for 30 minutes at a time, for at least the first 3-5 days, and then as needed for pain and swelling.  Continue to use ice for pain and swelling. You may notice swelling that will progress down to the foot and ankle.  This is normal after surgery.  Elevate your leg when you are not up walking on it.   Continue to use the breathing machine you got in the hospital (incentive spirometer) which will help keep your temperature down.  It is common for your temperature to cycle up and down following surgery, especially at night when you are not up moving around and exerting yourself.  The breathing machine keeps your lungs expanded and your temperature down.   DIET:  As you were doing prior to hospitalization, we recommend a well-balanced diet.  DRESSING / WOUND CARE / SHOWERING  You may change your dressing 3-5 days after surgery.  Then change the dressing every day with sterile gauze.  Please use good hand washing techniques before changing the dressing.  Do not  use any lotions or creams on the incision until instructed by your surgeon.  ACTIVITY  Increase activity slowly as tolerated, but follow the weight bearing instructions below.   No driving for 6 weeks or until further direction given by your physician.  You cannot drive while taking narcotics.  No lifting or carrying greater than 10 lbs. until further directed by your surgeon. Avoid periods of inactivity such as sitting longer than an hour when not asleep. This helps prevent blood clots.  You may return to work once you are authorized by your doctor.     WEIGHT BEARING   Weight bearing as tolerated with assist device (walker, cane, etc) as directed, use it as long as suggested by your surgeon or therapist, typically at least 4-6 weeks.   EXERCISES  Results after joint replacement surgery are often greatly improved when you follow the exercise, range of motion and muscle strengthening exercises prescribed by your doctor. Safety measures are also important to protect the joint from further injury. Any time any of these exercises cause you to have increased pain or swelling, decrease what you are doing until you are comfortable again and then slowly increase them. If you have problems or questions, call your caregiver or physical therapist for advice.   Rehabilitation is important following a joint replacement. After just a few days of immobilization, the muscles of  the leg can become weakened and shrink (atrophy).  These exercises are designed to build up the tone and strength of the thigh and leg muscles and to improve motion. Often times heat used for twenty to thirty minutes before working out will loosen up your tissues and help with improving the range of motion but do not use heat for the first two weeks following surgery (sometimes heat can increase post-operative swelling).   These exercises can be done on a training (exercise) mat, on the floor, on a table or on a bed. Use whatever  works the best and is most comfortable for you.    Use music or television while you are exercising so that the exercises are a pleasant break in your day. This will make your life better with the exercises acting as a break in your routine that you can look forward to.   Perform all exercises about fifteen times, three times per day or as directed.  You should exercise both the operative leg and the other leg as well.  Exercises include:   Quad Sets - Tighten up the muscle on the front of the thigh (Quad) and hold for 5-10 seconds.   Straight Leg Raises - With your knee straight (if you were given a brace, keep it on), lift the leg to 60 degrees, hold for 3 seconds, and slowly lower the leg.  Perform this exercise against resistance later as your leg gets stronger.  Leg Slides: Lying on your back, slowly slide your foot toward your buttocks, bending your knee up off the floor (only go as far as is comfortable). Then slowly slide your foot back down until your leg is flat on the floor again.  Angel Wings: Lying on your back spread your legs to the side as far apart as you can without causing discomfort.  Hamstring Strength:  Lying on your back, push your heel against the floor with your leg straight by tightening up the muscles of your buttocks.  Repeat, but this time bend your knee to a comfortable angle, and push your heel against the floor.  You may put a pillow under the heel to make it more comfortable if necessary.   A rehabilitation program following joint replacement surgery can speed recovery and prevent re-injury in the future due to weakened muscles. Contact your doctor or a physical therapist for more information on knee rehabilitation.    CONSTIPATION  Constipation is defined medically as fewer than three stools per week and severe constipation as less than one stool per week.  Even if you have a regular bowel pattern at home, your normal regimen is likely to be disrupted due to multiple  reasons following surgery.  Combination of anesthesia, postoperative narcotics, change in appetite and fluid intake all can affect your bowels.   YOU MUST use at least one of the following options; they are listed in order of increasing strength to get the job done.  They are all available over the counter, and you may need to use some, POSSIBLY even all of these options:    Drink plenty of fluids (prune juice may be helpful) and high fiber foods Colace 100 mg by mouth twice a day  Senokot for constipation as directed and as needed Dulcolax (bisacodyl), take with full glass of water  Miralax (polyethylene glycol) once or twice a day as needed.  If you have tried all these things and are unable to have a bowel movement in the first 3-4 days  after surgery call either your surgeon or your primary doctor.    If you experience loose stools or diarrhea, hold the medications until you stool forms back up.  If your symptoms do not get better within 1 week or if they get worse, check with your doctor.  If you experience "the worst abdominal pain ever" or develop nausea or vomiting, please contact the office immediately for further recommendations for treatment.   ITCHING:  If you experience itching with your medications, try taking only a single pain pill, or even half a pain pill at a time.  You can also use Benadryl over the counter for itching or also to help with sleep.   TED HOSE STOCKINGS:  Use stockings on both legs until for at least 2 weeks or as directed by physician office. They may be removed at night for sleeping.  MEDICATIONS:  See your medication summary on the "After Visit Summary" that nursing will review with you.  You may have some home medications which will be placed on hold until you complete the course of blood thinner medication.  It is important for you to complete the blood thinner medication as prescribed.  PRECAUTIONS:  If you experience chest pain or shortness of breath - call  [phone] immediately for transfer to the hospital emergency department.   If you develop a fever greater that 101 F, purulent drainage from wound, increased redness or drainage from wound, foul odor from the wound/dressing, or calf pain - CONTACT YOUR SURGEON.                                                   FOLLOW-UP APPOINTMENTS:  If you do not already have a post-op appointment, please call the office for an appointment to be seen by your surgeon.  Guidelines for how soon to be seen are listed in your "After Visit Summary", but are typically between 1-4 weeks after surgery.  OTHER INSTRUCTIONS:   Knee Replacement:  Do not place pillow under knee, focus on keeping the knee straight while resting. CPM instructions: 0-90 degrees, 2 hours in the morning, 2 hours in the afternoon, and 2 hours in the evening. Place foam block, curve side up under heel at all times except when in CPM or when walking.  DO NOT modify, tear, cut, or change the foam block in any way.  POST-OPERATIVE OPIOID TAPER INSTRUCTIONS: It is important to wean off of your opioid medication as soon as possible. If you do not need pain medication after your surgery it is ok to stop day one. Opioids include: Codeine, Hydrocodone(Norco, Vicodin), Oxycodone(Percocet, oxycontin) and hydromorphone amongst others.  Long term and even short term use of opiods can cause: Increased pain response Dependence Constipation Depression Respiratory depression And more.  Withdrawal symptoms can include Flu like symptoms Nausea, vomiting And more Techniques to manage these symptoms Hydrate well Eat regular healthy meals Stay active Use relaxation techniques(deep breathing, meditating, yoga) Do Not substitute Alcohol to help with tapering If you have been on opioids for less than two weeks and do not have pain than it is ok to stop all together.  Plan to wean off of opioids This plan should start within one week post op of your joint  replacement. Maintain the same interval or time between taking each dose and first decrease the dose.  Cut the total daily intake of opioids by one tablet each day Next start to increase the time between doses. The last dose that should be eliminated is the evening dose.   MAKE SURE YOU:  Understand these instructions.  Get help right away if you are not doing well or get worse.    Thank you for letting us be a part of your medical care team.  It is a privilege we respect greatly.  We hope these instructions will help you stay on track for a fast and full recovery!

## 2023-05-30 NOTE — Brief Op Note (Signed)
 05/30/2023  4:47 PM  PATIENT:  [name]  78 y.o. female  PRE-OPERATIVE DIAGNOSIS:  Right knee osteoarthritis, end stage  POST-OPERATIVE DIAGNOSIS:  Right knee osteoarthritis, end stage  PROCEDURE:  Procedure(s) with comments: ARTHROPLASTY, KNEE, TOTAL (Right) - general, place in flip room please DePuy Attune  SURGEON:  Surgeons and Role:    Beverely Low, MD - Primary  PHYSICIAN ASSISTANT:   ASSISTANTS: Thea Gist, PA-C   ANESTHESIA:   regional and general  EBL:  20 mL   BLOOD ADMINISTERED:none  DRAINS: none   LOCAL MEDICATIONS USED:  MARCAINE     SPECIMEN:  No Specimen  DISPOSITION OF SPECIMEN:  N/A  COUNTS:  YES  TOURNIQUET:  * Missing tourniquet times found for documented tourniquets in log: [id number] *  DICTATION: .Other Dictation: Dictation Number [id number]  PLAN OF CARE: Admit for overnight observation  PATIENT DISPOSITION:  PACU - hemodynamically stable.   Delay start of Pharmacological VTE agent (>24hrs) due to surgical blood loss or risk of bleeding: no

## 2023-05-30 NOTE — Anesthesia Procedure Notes (Signed)
 Anesthesia Regional Block: Adductor canal block   Pre-Anesthetic Checklist: , timeout performed,  Correct Patient, Correct Site, Correct Laterality,  Correct Procedure, Correct Position, site marked,  Risks and benefits discussed,  Surgical consent,  Pre-op evaluation,  At surgeon's request and post-op pain management  Laterality: Right  Prep: chloraprep       Needles:  Injection technique: Single-shot  Needle Type: Echogenic Stimulator Needle     Needle Length: 9cm  Needle Gauge: 21     Additional Needles:   Procedures:,,,, ultrasound used (permanent image in chart),,    Narrative:  Start time: 05/30/2023 2:10 PM End time: 05/30/2023 2:15 PM Injection made incrementally with aspirations every 5 mL.  Performed by: Personally  Anesthesiologist: [place] Nation, MD  Additional Notes: Discussed risks and benefits of the nerve block in detail, including but not limited vascular injury, permanent nerve damage and infection.   Patient tolerated the procedure well. Local anesthetic introduced in an incremental fashion under minimal resistance after negative aspirations. No paresthesias were elicited. After completion of the procedure, no acute issues were identified and patient continued to be monitored by RN.

## 2023-05-30 NOTE — Transfer of Care (Signed)
 Immediate Anesthesia Transfer of Care Note  Patient: [name]  Procedure(s) Performed: ARTHROPLASTY, KNEE, TOTAL (Right: Knee)  Patient Location: PACU  Anesthesia Type:Spinal  Level of Consciousness: awake and patient cooperative  Airway & Oxygen Therapy: Patient Spontanous Breathing and Patient connected to face mask  Post-op Assessment: Report given to RN and Post -op Vital signs reviewed and stable  Post vital signs: Reviewed and stable  Last Vitals:  Vitals Value Taken Time  BP 104/46 05/30/23 1706  Temp    Pulse 92 05/30/23 1709  Resp 26 05/30/23 1709  SpO2 98 % 05/30/23 1709  Vitals shown include unfiled device data.  Last Pain:  Vitals:   05/30/23 1425  TempSrc:   PainSc: 0-No pain         Complications: No notable events documented.

## 2023-05-30 NOTE — Plan of Care (Signed)
  Problem: Education: Goal: Knowledge of General Education information will improve Description: Including pain rating scale, medication(s)/side effects and non-pharmacologic comfort measures 05/30/2023 1937 by Hortencia Pilar, RN Outcome: Progressing 05/30/2023 1937 by Hortencia Pilar, RN Outcome: Progressing   Problem: Health Behavior/Discharge Planning: Goal: Ability to manage health-related needs will improve 05/30/2023 1937 by Hortencia Pilar, RN Outcome: Progressing 05/30/2023 1937 by Hortencia Pilar, RN Outcome: Progressing   Problem: Clinical Measurements: Goal: Ability to maintain clinical measurements within normal limits will improve 05/30/2023 1937 by Hortencia Pilar, RN Outcome: Progressing 05/30/2023 1937 by Hortencia Pilar, RN Outcome: Progressing Goal: Will remain free from infection 05/30/2023 1937 by Hortencia Pilar, RN Outcome: Progressing 05/30/2023 1937 by Hortencia Pilar, RN Outcome: Progressing Goal: Diagnostic test results will improve 05/30/2023 1937 by Hortencia Pilar, RN Outcome: Progressing 05/30/2023 1937 by Hortencia Pilar, RN Outcome: Progressing Goal: Respiratory complications will improve 05/30/2023 1937 by Hortencia Pilar, RN Outcome: Progressing 05/30/2023 1937 by Hortencia Pilar, RN Outcome: Progressing Goal: Cardiovascular complication will be avoided 05/30/2023 1937 by Hortencia Pilar, RN Outcome: Progressing 05/30/2023 1937 by Hortencia Pilar, RN Outcome: Progressing   Problem: Activity: Goal: Risk for activity intolerance will decrease 05/30/2023 1937 by Hortencia Pilar, RN Outcome: Progressing 05/30/2023 1937 by Hortencia Pilar, RN Outcome: Progressing   Problem: Nutrition: Goal: Adequate nutrition will be maintained 05/30/2023 1937 by Hortencia Pilar, RN Outcome: Progressing 05/30/2023 1937 by Hortencia Pilar, RN Outcome: Progressing   Problem: Coping: Goal: Level of anxiety will decrease 05/30/2023 1937 by Hortencia Pilar, RN Outcome: Progressing 05/30/2023 1937 by Hortencia Pilar, RN Outcome: Progressing   Problem: Elimination: Goal: Will not experience complications related to bowel motility 05/30/2023 1937 by Hortencia Pilar, RN Outcome: Progressing 05/30/2023 1937 by Hortencia Pilar, RN Outcome: Progressing Goal: Will not experience complications related to urinary retention 05/30/2023 1937 by Hortencia Pilar, RN Outcome: Progressing 05/30/2023 1937 by Hortencia Pilar, RN Outcome: Progressing   Problem: Pain Managment: Goal: General experience of comfort will improve and/or be controlled 05/30/2023 1937 by Hortencia Pilar, RN Outcome: Progressing 05/30/2023 1937 by Hortencia Pilar, RN Outcome: Progressing   Problem: Safety: Goal: Ability to remain free from injury will improve 05/30/2023 1937 by Hortencia Pilar, RN Outcome: Progressing 05/30/2023 1937 by Hortencia Pilar, RN Outcome: Progressing   Problem: Skin Integrity: Goal: Risk for impaired skin integrity will decrease 05/30/2023 1937 by Hortencia Pilar, RN Outcome: Progressing 05/30/2023 1937 by Hortencia Pilar, RN Outcome: Progressing

## 2023-05-30 NOTE — Plan of Care (Signed)

## 2023-05-30 NOTE — Interval H&P Note (Signed)
 History and Physical Interval Note:  05/30/2023 1:10 PM  [name]  has presented today for surgery, with the diagnosis of Right knee osteoarthritis.  The various methods of treatment have been discussed with the patient and family. After consideration of risks, benefits and other options for treatment, the patient has consented to  Procedure(s) with comments: ARTHROPLASTY, KNEE, TOTAL (Right) - general, place in flip room please as a surgical intervention.  The patient's history has been reviewed, patient examined, no change in status, stable for surgery.  I have reviewed the patient's chart and labs.  Questions were answered to the patient's satisfaction.     Verlee Rossetti

## 2023-05-31 DIAGNOSIS — M170 Bilateral primary osteoarthritis of knee: Secondary | ICD-10-CM | POA: Diagnosis not present

## 2023-05-31 NOTE — Care Management Obs Status (Signed)
 MEDICARE OBSERVATION STATUS NOTIFICATION   Patient Details  Name: [name] MRN: [id number] Date of Birth: [birth year]   Medicare Observation Status Notification Given:  Yes    Diona Browner, LCSW 05/31/2023, 11:45 AM

## 2023-05-31 NOTE — Care Management Obs Status (Deleted)
 MEDICARE OBSERVATION STATUS NOTIFICATION   Patient Details  Name: [name] MRN: [id number] Date of Birth: 12-30-1945   Medicare Observation Status Notification Given:       Diona Browner, [place] 05/31/2023, 11:45 AM

## 2023-05-31 NOTE — Evaluation (Signed)
 Physical Therapy Evaluation Patient Details Name: [name] MRN: [id number] DOB: Oct 13, 1945 Today's Date: 05/31/2023  History of Present Illness  Pt is a 78 year old female s/p R TKA on 05/30/23.  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  Pt will benefit from acute skilled PT to increase their independence and safety with mobility to allow discharge.   Pt with 10/10 pain earlier and premedicated for session. Pt currently at 8/10 during evaluation however reported no increase with movement and she felt better once settled in recliner (happy being OOB).  Pt reports she still has DME from previous L TKA and that her daughter will stay with her for a week upon d/c.  Pt has a ramp to enter home.           If plan is discharge home, recommend the following: A little help with walking and/or transfers;A little help with bathing/dressing/bathroom;Assistance with cooking/housework;Assist for transportation;Help with stairs or ramp for entrance   Can travel by private vehicle        Equipment Recommendations None recommended by PT  Recommendations for Other Services       Functional Status Assessment Patient has had a recent decline in their functional status and demonstrates the ability to make significant improvements in function in a reasonable and predictable amount of time.     Precautions / Restrictions Precautions Precautions: Fall;Knee Restrictions Weight Bearing Restrictions Per Provider Order: No Other Position/Activity Restrictions: WBAT      Mobility  Bed Mobility Overal bed mobility: Needs Assistance Bed Mobility: Supine to Sit     Supine to sit: Mod assist, HOB elevated, Used rails     General bed mobility comments: assist for R LE for pain control and trunk upright, increased time and effort to scoot to EOB    Transfers Overall transfer level: Needs assistance Equipment  used: Rolling walker (2 wheels) Transfers: Sit to/from Stand, Bed to chair/wheelchair/BSC Sit to Stand: Min assist, +2 safety/equipment Stand pivot transfers: Min assist, +2 safety/equipment         General transfer comment: verbal cues for UE and LE positioning, assist to rise, stabilize and control descent; RN standby for +2 but not needed    Ambulation/Gait                  Stairs            Wheelchair Mobility     Tilt Bed    Modified Rankin (Stroke Patients Only)       Balance                                             Pertinent Vitals/Pain Pain Assessment Pain Assessment: 0-10 Pain Score: 8  Pain Location: R knee and thigh Pain Descriptors / Indicators: Sore, Aching, Guarding, Grimacing Pain Intervention(s): Repositioned, Premedicated before session, Monitored during session    Home Living Family/patient expects to be discharged to:: Private residence Living Arrangements: Alone Available Help at Discharge: Family (daughter to stay for a week) Type of Home: House Home Access: Ramped entrance       Home Layout: One level Home Equipment: Agricultural consultant (2 wheels);Rollator (4 wheels);Tub bench;Toilet riser      Prior Function Prior Level of Function : Independent/Modified Independent  Extremity/Trunk Assessment        Lower Extremity Assessment Lower Extremity Assessment: RLE deficits/detail RLE Deficits / Details: R knee 10-80* AAROM (flexion with sitting edge of recliner)       Communication   Communication Communication: No apparent difficulties    Cognition Arousal: Alert Behavior During Therapy: WFL for tasks assessed/performed   PT - Cognitive impairments: No apparent impairments                         Following commands: Intact       Cueing       General Comments      Exercises     Assessment/Plan    PT Assessment Patient needs continued PT services   PT Problem List Decreased strength;Decreased range of motion;Decreased balance;Decreased mobility;Pain;Decreased knowledge of use of DME;Decreased knowledge of precautions       PT Treatment Interventions Stair training;Gait training;DME instruction;Balance training;Therapeutic activities;Therapeutic exercise;Patient/family education;Functional mobility training    PT Goals (Current goals can be found in the Care Plan section)  Acute Rehab PT Goals PT Goal Formulation: With patient/family Time For Goal Achievement: 06/07/23 Potential to Achieve Goals: Good    Frequency 7X/week     Co-evaluation               AM-PAC PT "6 Clicks" Mobility  Outcome Measure Help needed turning from your back to your side while in a flat bed without using bedrails?: A Lot Help needed moving from lying on your back to sitting on the side of a flat bed without using bedrails?: A Lot Help needed moving to and from a bed to a chair (including a wheelchair)?: A Lot Help needed standing up from a chair using your arms (e.g., wheelchair or bedside chair)?: A Lot Help needed to walk in hospital room?: A Lot Help needed climbing 3-5 steps with a railing? : Total 6 Click Score: 11    End of Session Equipment Utilized During Treatment: Gait belt Activity Tolerance: Patient limited by pain Patient left: in chair;with call bell/phone within reach;with family/visitor present;with chair alarm set Nurse Communication: Mobility status PT Visit Diagnosis: Difficulty in walking, not elsewhere classified (R26.2);Pain Pain - Right/Left: Right Pain - part of body: Knee    Time: 1110-1127 PT Time Calculation (min) (ACUTE ONLY): 17 min   Charges:   PT Evaluation $PT Eval Low Complexity: 1 Low   PT General Charges $$ ACUTE PT VISIT: 1 Visit        Thomasene Mohair PT, DPT Physical Therapist Acute Rehabilitation Services Office: [phone]   [name] 05/31/2023, 12:26 PM

## 2023-05-31 NOTE — Progress Notes (Signed)
 Physical Therapy Treatment Patient Details Name: [name] MRN: [id number] DOB: November 04, 1945 Today's Date: 05/31/2023   History of Present Illness Pt is a 78 year old female s/p R TKA on 05/30/23.  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  Pain increased with mobilizing however RN provided pain meds beginning of session.  Pt requesting undergarments due to urine leakage and required mod assist to stand, stabilize, and pull up.  Pt then provided verbal cues for ambulation however only tolerated short distance in room due to pain and dizziness (recliner provided for safety and then pt pivoted recliner to bed for return to bed.)  Pt repositioned in bed and provided with ice packs end of session.     If plan is discharge home, recommend the following: A little help with walking and/or transfers;A little help with bathing/dressing/bathroom;Assistance with cooking/housework;Assist for transportation;Help with stairs or ramp for entrance   Can travel by private vehicle        Equipment Recommendations  None recommended by PT    Recommendations for Other Services       Precautions / Restrictions Precautions Precautions: Fall;Knee Restrictions Weight Bearing Restrictions Per Provider Order: No Other Position/Activity Restrictions: WBAT     Mobility  Bed Mobility Overal bed mobility: Needs Assistance Bed Mobility: Sit to Supine     Supine to sit: Mod assist, HOB elevated, Used rails Sit to supine: Mod assist   General bed mobility comments: assist for LEs, increased time and effort to reposition in supine    Transfers Overall transfer level: Needs assistance Equipment used: Rolling walker (2 wheels) Transfers: Sit to/from Stand Sit to Stand: Min assist, +2 safety/equipment, Mod assist Stand pivot transfers: Min assist, +2 safety/equipment         General transfer comment: verbal cues for UE and LE  positioning, assist to rise, stabilize and control descent; pt daughter on other side for safety; mod assist for balance (pt wanted to don undergarments/pad)    Ambulation/Gait Ambulation/Gait assistance: Min assist, +2 safety/equipment Gait Distance (Feet): 10 Feet Assistive device: Rolling walker (2 wheels) Gait Pattern/deviations: Step-to pattern, Decreased stance time - right, Antalgic Gait velocity: decr     General Gait Details: verbal cues for sequence, step length, RW positioning, posture; distance limited by pain and dizziness; recliner brought to pt for safety   Stairs             Wheelchair Mobility     Tilt Bed    Modified Rankin (Stroke Patients Only)       Balance                                            Communication Communication Communication: No apparent difficulties  Cognition Arousal: Alert Behavior During Therapy: WFL for tasks assessed/performed   PT - Cognitive impairments: No apparent impairments                         Following commands: Intact      Cueing    Exercises      General Comments        Pertinent Vitals/Pain Pain Assessment Pain Assessment: 0-10 Pain Score: 8  Pain Location: R knee and thigh Pain Descriptors / Indicators: Sore, Aching, Guarding, Grimacing Pain Intervention(s): Monitored  during session, RN gave pain meds during session, Repositioned, Ice applied    Home Living Family/patient expects to be discharged to:: Private residence Living Arrangements: Alone Available Help at Discharge: Family (daughter to stay for a week) Type of Home: House Home Access: Ramped entrance       Home Layout: One level Home Equipment: Agricultural consultant (2 wheels);Rollator (4 wheels);Tub bench;Toilet riser      Prior Function            PT Goals (current goals can now be found in the care plan section) Acute Rehab PT Goals PT Goal Formulation: With patient/family Time For Goal  Achievement: 06/07/23 Potential to Achieve Goals: Good Progress towards PT goals: Progressing toward goals    Frequency    7X/week      PT Plan      Co-evaluation              AM-PAC PT "6 Clicks" Mobility   Outcome Measure  Help needed turning from your back to your side while in a flat bed without using bedrails?: A Lot Help needed moving from lying on your back to sitting on the side of a flat bed without using bedrails?: A Lot Help needed moving to and from a bed to a chair (including a wheelchair)?: A Lot Help needed standing up from a chair using your arms (e.g., wheelchair or bedside chair)?: A Lot Help needed to walk in hospital room?: A Lot Help needed climbing 3-5 steps with a railing? : Total 6 Click Score: 11    End of Session Equipment Utilized During Treatment: Gait belt Activity Tolerance: Patient limited by pain Patient left: with call bell/phone within reach;with family/visitor present;in bed;with bed alarm set Nurse Communication: Mobility status PT Visit Diagnosis: Difficulty in walking, not elsewhere classified (R26.2);Pain Pain - Right/Left: Right Pain - part of body: Knee     Time: 4696-2952 PT Time Calculation (min) (ACUTE ONLY): 19 min  Charges:    $Gait Training: 8-22 mins PT General Charges $$ ACUTE PT VISIT: 1 Visit                     Paulino Door, DPT Physical Therapist Acute Rehabilitation Services Office: [phone]    [name] 05/31/2023, 3:58 PM

## 2023-05-31 NOTE — Progress Notes (Signed)
 Orthopedics Progress Note  Subjective: Patient complaining of a lot of pain this AM  Objective:  Vitals:   05/31/23 0128 05/31/23 0524  BP: 137/61 (!) 135/57  Pulse: 81 79  Resp: 17 16  Temp: 98.2 F (36.8 C) 98.2 F (36.8 C)  SpO2: 96% 97%    General: Awake and alert  Musculoskeletal: dressing changed. Knee ranged gently. No pain with ankle pumps Neurovascularly intact  Lab Results  Component Value Date   WBC 10.5 05/21/2023   HGB 14.5 05/21/2023   HCT 44.8 05/21/2023   MCV 94.7 05/21/2023   PLT 237 05/21/2023       Component Value Date/Time   NA 139 05/21/2023 1354   K 3.8 05/21/2023 1354   CL 103 05/21/2023 1354   CO2 25 05/21/2023 1354   GLUCOSE 98 05/21/2023 1354   BUN 15 05/21/2023 1354   CREATININE 0.60 05/21/2023 1354   CREATININE 0.69 10/06/2012 1540   CALCIUM 9.3 05/21/2023 1354   GFRNONAA >60 05/21/2023 1354   GFRAA >60 03/02/2019 0825    Lab Results  Component Value Date   INR 1.0 09/24/2022   INR 0.9 06/13/2021   INR 0.9 03/21/2020    Assessment/Plan: POD #1 s/p Procedure(s): ARTHROPLASTY, KNEE, TOTAL Up with PT, aggressive ROM. Dangle knee off bed DVT prophylaxis with ASA and TED hose and SCDs Likely D/C tomorrow  Viviann Spare R. [name], MD 05/31/2023 6:48 AM

## 2023-06-01 DIAGNOSIS — M170 Bilateral primary osteoarthritis of knee: Secondary | ICD-10-CM | POA: Diagnosis not present

## 2023-06-01 MED ORDER — HYDROCODONE-ACETAMINOPHEN 5-325 MG PO TABS
1.0000 | ORAL_TABLET | Freq: Four times a day (QID) | ORAL | 0 refills | Status: AC | PRN
Start: 2023-06-01 — End: ?

## 2023-06-01 MED ORDER — ONDANSETRON HCL 4 MG PO TABS: 4.0000 mg | ORAL_TABLET | Freq: Three times a day (TID) | ORAL | 1 refills | Status: AC | PRN

## 2023-06-01 MED ORDER — METHOCARBAMOL 500 MG PO TABS: 500.0000 mg | ORAL_TABLET | Freq: Three times a day (TID) | ORAL | 1 refills | Status: AC | PRN

## 2023-06-01 MED ORDER — ASPIRIN 81 MG PO TBEC: 81.0000 mg | DELAYED_RELEASE_TABLET | Freq: Two times a day (BID) | ORAL | 0 refills | Status: AC

## 2023-06-01 NOTE — TOC Transition Note (Signed)
 Transition of Care [facility]) - Discharge Note   Patient Details  Name: [name] MRN: [id number] Date of Birth: [birth year]  [facility] CM/SW Contact:  Epifanio Lesches, RN Phone Number: 06/01/2023, 10:52 AM   Clinical Narrative:    Patient will DC to: home Anticipated DC date: 06/01/2023 Family notified: yes Transport by: car      - Status post total knee replacement, right 3/21 Per MD patient ready for DC pending therapy clearance today. RN, patient, and patient's daughter notified of DC plan. Pt states daughter to assist with care once d/c. Pt to f/u with [facility] for outpatient services. Pt without DME needs. Pt without RX med concerns. Post hospital f/u noted on AVS. Daughter to provide transportation to home .  RNCM will sign off for now as intervention is no longer needed. Please consult [place] again if new needs arise.    Final next level of care: Home/Self Care Barriers to Discharge: Other (must enter comment) (pending therapy clearance)   Patient Goals and CMS Choice            Discharge Placement                       Discharge Plan and Services Additional resources added to the After Visit Summary for                                       Social Drivers of Health (SDOH) Interventions SDOH Screenings   Food Insecurity: No Food Insecurity (09/24/2022)  Housing: Low Risk  (09/24/2022)  Transportation Needs: No Transportation Needs (09/24/2022)  Utilities: Not At Risk (09/24/2022)  Tobacco Use: Low Risk  (05/30/2023)     Readmission Risk Interventions    09/25/2022    9:46 AM  Readmission Risk Prevention Plan  Post Dischage Appt Complete  Medication Screening Complete  Transportation Screening Complete

## 2023-06-01 NOTE — Progress Notes (Signed)
   Subjective: 2 Days Post-Op Procedure(s) (LRB): ARTHROPLASTY, KNEE, TOTAL (Right) Patient seen in rounds for Dr. [name]. Reports pain is improving. Yesterday was difficult but feeling much better today. Denies SOB or chest pain. Denies calf pain. Eager to d/c home.  Objective: Vital signs in last 24 hours: Temp:  [97.9 F (36.6 C)-98.2 F (36.8 C)] 97.9 F (36.6 C) (03/23 0611) Pulse Rate:  [75-76] 76 (03/23 0611) Resp:  [16-18] 18 (03/23 0611) BP: (134-149)/(59-64) 140/59 (03/23 0611) SpO2:  [93 %-94 %] 93 % (03/23 0611)  Intake/Output from previous day:  Intake/Output Summary (Last 24 hours) at 06/01/2023 0935 Last data filed at 06/01/2023 0830 Gross per 24 hour  Intake 720 ml  Output 500 ml  Net 220 ml    Intake/Output this shift: Total I/O In: 240 [P.O.:240] Out: -   Labs: No results for input(s): "HGB" in the last 72 hours. No results for input(s): "WBC", "RBC", "HCT", "PLT" in the last 72 hours. No results for input(s): "NA", "K", "CL", "CO2", "BUN", "CREATININE", "GLUCOSE", "CALCIUM" in the last 72 hours. No results for input(s): "LABPT", "INR" in the last 72 hours.  Exam: General - Patient is Alert and Oriented Extremity - Neurologically intact Neurovascular intact Sensation intact distally Dorsiflexion/Plantar flexion intact Dressing/Incision - clean, dry, no drainage Motor Function - intact, moving foot and toes well on exam.  Past Medical History:  Diagnosis Date   Arthritis    Bacterial overgrowth syndrome    Positive HBT   Carotid artery occlusion    GERD (gastroesophageal reflux disease)    Heart murmur    mild AS 03/21/20 echo   Hypercholesteremia    Hypertension    Stroke (HCC)    hx TIA   TIA (transient ischemic attack)     Assessment/Plan: 2 Days Post-Op Procedure(s) (LRB): ARTHROPLASTY, KNEE, TOTAL (Right) Principal Problem:   Status post total knee replacement, right  Estimated body mass index is 33.63 kg/m as calculated from  the following:   Height as of this encounter: 5\' 1"  (1.549 m).   Weight as of this encounter: 80.7 kg.  Weight-bearing as tolerated.  Continue physical therapy today. Likely discharge home pending progress with physical therapy and symptom management. F/u in clinic in 2 weeks.  Alfonzo Feller, PA-C Orthopedic Surgery [phone] 06/01/2023, 9:35 AM

## 2023-06-01 NOTE — Progress Notes (Signed)
 Physical Therapy Treatment Patient Details Name: [name] MRN: [id number] DOB: 01/02/1946 Today's Date: 06/01/2023   History of Present Illness Pt is a 78 year old female s/p R TKA on 05/30/23.  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  Pt lives home alone but her daughter from [place] will be staying with her initially. Assisted OOB to amb in hallway went well.  Then returned to room to perform some TE's following HEP handout.  Instructed on proper tech, freq as well as use of ICE.   Addressed all mobility questions, discussed appropriate activity, educated on use of ICE.  Pt ready for D/C to home.    If plan is discharge home, recommend the following: A little help with walking and/or transfers;A little help with bathing/dressing/bathroom;Assistance with cooking/housework;Assist for transportation;Help with stairs or ramp for entrance   Can travel by private vehicle        Equipment Recommendations  None recommended by PT    Recommendations for Other Services       Precautions / Restrictions Precautions Precautions: Fall;Knee Precaution/Restrictions Comments: no pillow under knee Restrictions Weight Bearing Restrictions Per Provider Order: No Other Position/Activity Restrictions: WBAT     Mobility  Bed Mobility Overal bed mobility: Needs Assistance Bed Mobility: Supine to Sit     Supine to sit: Contact guard, Min assist     General bed mobility comments: assist for LEs, increased time and effort to reposition in supine    Transfers Overall transfer level: Needs assistance Equipment used: Rolling walker (2 wheels) Transfers: Sit to/from Stand Sit to Stand: Supervision, Contact guard assist           General transfer comment: pt self able with increased time    Ambulation/Gait Ambulation/Gait assistance: Supervision, Contact guard assist Gait Distance (Feet): 45  Feet Assistive device: Rolling walker (2 wheels) Gait Pattern/deviations: Step-to pattern, Decreased stance time - right, Antalgic Gait velocity: decreased     General Gait Details: verbal cues for sequence, step length, RW positioning, posture; tolerated an increased distance   Stairs Stairs:  (RAMP)           Wheelchair Mobility     Tilt Bed    Modified Rankin (Stroke Patients Only)       Balance                                            Communication Communication Communication: No apparent difficulties  Cognition Arousal: Alert Behavior During Therapy: WFL for tasks assessed/performed   PT - Cognitive impairments: No apparent impairments                         Following commands: Intact      Cueing    Exercises  Total Knee Replacement TE's following HEP handout 10 reps B LE ankle pumps 05 reps towel squeezes 05 reps knee presses 05 reps heel slides  05 reps SAQ's 05 reps SLR's 05 reps ABD Educated on use of gait belt to assist with TE's Followed by ICE     General Comments        Pertinent Vitals/Pain Pain Assessment Pain Assessment: 0-10 Pain Score: 5  Pain Location: R knee and thigh Pain Descriptors /  Indicators: Sore, Aching, Guarding, Grimacing Pain Intervention(s): Monitored during session, Premedicated before session, Repositioned, Ice applied    Home Living                          Prior Function            PT Goals (current goals can now be found in the care plan section) Progress towards PT goals: Progressing toward goals    Frequency    7X/week      PT Plan      Co-evaluation              AM-PAC PT "6 Clicks" Mobility   Outcome Measure  Help needed turning from your back to your side while in a flat bed without using bedrails?: A Little Help needed moving from lying on your back to sitting on the side of a flat bed without using bedrails?: A Little Help needed  moving to and from a bed to a chair (including a wheelchair)?: A Little Help needed standing up from a chair using your arms (e.g., wheelchair or bedside chair)?: A Little Help needed to walk in hospital room?: A Little Help needed climbing 3-5 steps with a railing? : A Little 6 Click Score: 18    End of Session Equipment Utilized During Treatment: Gait belt Activity Tolerance: Patient limited by pain Patient left: with call bell/phone within reach;with family/visitor present;in bed;with bed alarm set Nurse Communication: Mobility status PT Visit Diagnosis: Difficulty in walking, not elsewhere classified (R26.2);Pain Pain - Right/Left: Right Pain - part of body: Knee     Time: 1610-9604 PT Time Calculation (min) (ACUTE ONLY): 32 min  Charges:    $Gait Training: 8-22 mins $Therapeutic Exercise: 8-22 mins PT General Charges $$ ACUTE PT VISIT: 1 Visit                    Felecia Shelling  PTA Acute  Rehabilitation Services Office M-F          [phone]

## 2023-06-01 NOTE — Plan of Care (Signed)

## 2023-06-02 ENCOUNTER — Encounter (HOSPITAL_COMMUNITY): Payer: Self-pay | Admitting: Orthopedic Surgery

## 2023-06-02 ENCOUNTER — Ambulatory Visit: Payer: Medicare Other

## 2023-06-03 NOTE — Anesthesia Postprocedure Evaluation (Signed)
 Anesthesia Post Note  Patient: [name]  Procedure(s) Performed: ARTHROPLASTY, KNEE, TOTAL (Right: Knee)     Patient location during evaluation: PACU Anesthesia Type: Regional and Spinal Level of consciousness: oriented and awake and alert Pain management: pain level controlled Vital Signs Assessment: post-procedure vital signs reviewed and stable Respiratory status: spontaneous breathing, respiratory function stable and patient connected to nasal cannula oxygen Cardiovascular status: blood pressure returned to baseline and stable Postop Assessment: no headache, no backache and no apparent nausea or vomiting Anesthetic complications: no   No notable events documented.  Last Vitals:  Vitals:   05/31/23 2250 06/01/23 0611  BP: (!) 149/64 (!) 140/59  Pulse: 75 76  Resp: 17 18  Temp: 36.8 C 36.6 C  SpO2: 94% 93%    Last Pain:  Vitals:   06/01/23 1341  TempSrc:   PainSc: 6                  Keyandre Pileggi S

## 2023-06-03 NOTE — Discharge Summary (Signed)
 In most cases prophylactic antibiotics for Dental procdeures after total joint surgery are not necessary.  Exceptions are as follows:  1. History of prior total joint infection  2. Severely immunocompromised (Organ Transplant, cancer chemotherapy, Rheumatoid biologic meds such as Humera)  3. Poorly controlled diabetes (A1C &gt; 8.0, blood glucose over 200)  If you have one of these conditions, contact your surgeon for an antibiotic prescription, prior to your dental procedure. Orthopedic Discharge Summary        Physician Discharge Summary  Patient ID: [name] MRN: [id number] DOB/AGE: 01-Feb-1946 78 y.o.  Admit date: 05/30/2023 Discharge date: 05/31/33      Procedures:  Procedure(s) (LRB): ARTHROPLASTY, KNEE, TOTAL (Right)  Attending Physician:  Dr. Malon Kindle  Admission Diagnoses:   left knee end stage osteoarthritis  Discharge Diagnoses:  left knee end stage osteoarthritis   Past Medical History:  Diagnosis Date   Arthritis    Bacterial overgrowth syndrome    Positive HBT   Carotid artery occlusion    GERD (gastroesophageal reflux disease)    Heart murmur    mild AS 03/21/20 echo   Hypercholesteremia    Hypertension    Stroke (HCC)    hx TIA   TIA (transient ischemic attack)     PCP: Carylon Perches, MD   Discharged Condition: good  Hospital Course:  Patient underwent the above stated procedure on 05/30/2023. Patient tolerated the procedure well and brought to the recovery room in good condition and subsequently to the floor. Patient had an uncomplicated hospital course and was stable for discharge.   Disposition: Discharge disposition: 01-Home or Self Care      with follow up in 2 weeks    Follow-up Information     Beverely Low, MD. Call in 2 week(s).   Specialty: Orthopedic Surgery Why: please call for appt in two weeks in the office Contact information: [address] [place] [address] [phone]          Carylon Perches, MD Follow up.   Specialty: Internal Medicine Contact information: [address] [place] [address] [phone]                 Dental Antibiotics:  In most cases prophylactic antibiotics for Dental procdeures after total joint surgery are not necessary.  Exceptions are as follows:  1. History of prior total joint infection  2. Severely immunocompromised (Organ Transplant, cancer chemotherapy, Rheumatoid biologic meds such as Humera)  3. Poorly controlled diabetes (A1C &gt; 8.0, blood glucose over 200)  If you have one of these conditions, contact your surgeon for an antibiotic prescription, prior to your dental procedure.  Discharge Instructions     Call MD / Call 911   Complete by: As directed    If you experience chest pain or shortness of breath, CALL 911 and be transported to the hospital emergency room.  If you develope a fever above 101 F, pus (white drainage) or increased drainage or redness at the wound, or calf pain, call your surgeon's office.   Change dressing   Complete by: As directed    You may remove the bulky bandage (ACE wrap and gauze) two days after surgery. You will have an adhesive waterproof bandage underneath. Leave this in place until your first follow-up appointment.   Constipation Prevention   Complete by: As directed    Drink plenty of fluids.  Prune juice may be helpful.  You may use a stool softener, such as Colace (over  the counter) 100 mg twice a day.  Use MiraLax (over the counter) for constipation as needed.   Diet - low sodium heart healthy   Complete by: As directed    Do not put a pillow under the knee. Place it under the heel.   Complete by: As directed    Driving restrictions   Complete by: As directed    No driving for two weeks   Post-operative opioid taper instructions:   Complete by: As directed    POST-OPERATIVE OPIOID TAPER INSTRUCTIONS: It is important to wean off of your opioid medication as  soon as possible. If you do not need pain medication after your surgery it is ok to stop day one. Opioids include: Codeine, Hydrocodone(Norco, Vicodin), Oxycodone(Percocet, oxycontin) and hydromorphone amongst others.  Long term and even short term use of opiods can cause: Increased pain response Dependence Constipation Depression Respiratory depression And more.  Withdrawal symptoms can include Flu like symptoms Nausea, vomiting And more Techniques to manage these symptoms Hydrate well Eat regular healthy meals Stay active Use relaxation techniques(deep breathing, meditating, yoga) Do Not substitute Alcohol to help with tapering If you have been on opioids for less than two weeks and do not have pain than it is ok to stop all together.  Plan to wean off of opioids This plan should start within one week post op of your joint replacement. Maintain the same interval or time between taking each dose and first decrease the dose.  Cut the total daily intake of opioids by one tablet each day Next start to increase the time between doses. The last dose that should be eliminated is the evening dose.      TED hose   Complete by: As directed    Use stockings (TED hose) for three weeks on both leg(s).  You may remove them at night for sleeping.   Weight bearing as tolerated   Complete by: As directed        Allergies as of 06/01/2023       Reactions   Oxycodone Other (See Comments)   "makes me deathly sick"        Medication List     TAKE these medications    acetaminophen 500 MG tablet Commonly known as: TYLENOL Take 500 mg by mouth every 6 (six) hours as needed for moderate pain (pain score 4-6).   amLODipine 5 MG tablet Commonly known as: NORVASC Take 5 mg by mouth daily.   aspirin EC 81 MG tablet Take 1 tablet (81 mg total) by mouth 2 (two) times daily. Swallow whole. What changed: when to take this   atorvastatin 40 MG tablet Commonly known as: LIPITOR Take  1 tablet (40 mg total) by mouth at bedtime.   B-complex with vitamin C tablet Take 1 tablet by mouth daily.   diphenhydramine-acetaminophen 25-500 MG Tabs tablet Commonly known as: TYLENOL PM Take 1 tablet by mouth at bedtime.   esomeprazole 20 MG capsule Commonly known as: NEXIUM Take 20 mg by mouth daily as needed (acid reflux/indigestion.).   ezetimibe 10 MG tablet Commonly known as: ZETIA Take 10 mg by mouth daily.   gabapentin 100 MG capsule Commonly known as: NEURONTIN Take 100 mg by mouth daily.   HYDROcodone-acetaminophen 5-325 MG tablet Commonly known as: NORCO/VICODIN Take 1-2 tablets by mouth every 6 (six) hours as needed for moderate pain (pain score 4-6) or severe pain (pain score 7-10). Take one tab po q 4 hrs prn pain What changed:  how much to take how to take this when to take this reasons to take this   methocarbamol 500 MG tablet Commonly known as: ROBAXIN Take 1 tablet (500 mg total) by mouth every 8 (eight) hours as needed for muscle spasms.   ondansetron 4 MG tablet Commonly known as: Zofran Take 1 tablet (4 mg total) by mouth every 8 (eight) hours as needed for nausea, vomiting or refractory nausea / vomiting.   traMADol 50 MG tablet Commonly known as: ULTRAM Take 50 mg by mouth every 6 (six) hours as needed for severe pain (pain score 7-10).   Voltaren 1 % Gel Generic drug: diclofenac Sodium Apply 2 g topically daily as needed (leg pain).               Discharge Care Instructions  (From admission, onward)           Start     Ordered   06/01/23 0000  Weight bearing as tolerated        06/01/23 0937   06/01/23 0000  Change dressing       Comments: You may remove the bulky bandage (ACE wrap and gauze) two days after surgery. You will have an adhesive waterproof bandage underneath. Leave this in place until your first follow-up appointment.   06/01/23 8527              Signed: Thea Gist 06/03/2023, 1:28  PM  [facility] is now [facility] [address], [address], [place], [place] [address] Phone: [phone] Facebook  Instagram  [employer]

## 2023-06-09 ENCOUNTER — Other Ambulatory Visit (HOSPITAL_COMMUNITY): Payer: Self-pay | Admitting: Otolaryngology

## 2023-06-09 DIAGNOSIS — M79604 Pain in right leg: Secondary | ICD-10-CM

## 2023-06-10 ENCOUNTER — Ambulatory Visit (HOSPITAL_COMMUNITY)
Admission: RE | Admit: 2023-06-10 | Discharge: 2023-06-10 | Disposition: A | Discharge status: Home / Self Care | Source: Ambulatory Visit | Attending: Otolaryngology | Admitting: Otolaryngology

## 2023-06-10 DIAGNOSIS — M79604 Pain in right leg: Secondary | ICD-10-CM | POA: Diagnosis not present

## 2023-06-20 ENCOUNTER — Ambulatory Visit: Admitting: Urology

## 2023-07-01 ENCOUNTER — Ambulatory Visit: Admitting: Urology

## 2023-07-01 ENCOUNTER — Encounter: Payer: Self-pay | Admitting: Urology

## 2023-07-01 VITALS — BP 142/79 | HR 83

## 2023-07-01 DIAGNOSIS — N3281 Overactive bladder: Secondary | ICD-10-CM | POA: Insufficient documentation

## 2023-07-01 DIAGNOSIS — R351 Nocturia: Secondary | ICD-10-CM | POA: Insufficient documentation

## 2023-07-01 DIAGNOSIS — N3941 Urge incontinence: Secondary | ICD-10-CM | POA: Diagnosis not present

## 2023-07-01 DIAGNOSIS — R350 Frequency of micturition: Secondary | ICD-10-CM | POA: Insufficient documentation

## 2023-07-01 LAB — BLADDER SCAN AMB NON-IMAGING: Scan Result: 15

## 2023-07-01 NOTE — Progress Notes (Signed)
 Name: [name] DOB: 1945-04-27 MRN: [id number]  History of Present Illness: [name] is a 78 y.o. female who presents today for follow up visit at [facility] [place].  GU History includes: 1. OAB with urinary frequency, nocturia, urgency, and urge incontinence. - Failed Sanctura  (Trospium ), Myrbetriq  (Mirabegron ), and Gemtesa  (Vibegron ).  At last visit with Dr. Claretta Croft on 02/19/2023: Discussed refractory OAB.  Since last visit: Completed 12 sessions of PTNS.  Today: She reports that since completing the 12 sessions of PTNS she has had significant improvement with reduced urinary urgency and daytime urinary frequency. Reports resolution of her urge incontinence. She continues to have nocturia x2 which is unchanged compared to prior but she reports the nocturia is not significantly bothersome.   Denies dysuria, gross hematuria, hesitancy, straining to void, or sensations of incomplete emptying.   Medications: Current Outpatient Medications  Medication Sig Dispense Refill   acetaminophen  (TYLENOL ) 500 MG tablet Take 500 mg by mouth every 6 (six) hours as needed for moderate pain (pain score 4-6).     amLODipine  (NORVASC ) 5 MG tablet Take 5 mg by mouth daily.  1   aspirin  EC 81 MG tablet Take 1 tablet (81 mg total) by mouth 2 (two) times daily. Swallow whole. 60 tablet 0   atorvastatin  (LIPITOR) 40 MG tablet Take 1 tablet (40 mg total) by mouth at bedtime. 30 tablet 1   B Complex-C (B-COMPLEX WITH VITAMIN C) tablet Take 1 tablet by mouth daily.     diclofenac  Sodium (VOLTAREN ) 1 % GEL Apply 2 g topically daily as needed (leg pain).     diphenhydramine -acetaminophen  (TYLENOL  PM) 25-500 MG TABS tablet Take 1 tablet by mouth at bedtime.     esomeprazole (NEXIUM) 20 MG capsule Take 20 mg by mouth daily as needed (acid reflux/indigestion.).     ezetimibe  (ZETIA ) 10 MG tablet Take 10 mg by mouth daily.     gabapentin  (NEURONTIN ) 100 MG capsule Take 100 mg by mouth daily.      HYDROcodone -acetaminophen  (NORCO/VICODIN) 5-325 MG tablet Take 1-2 tablets by mouth every 6 (six) hours as needed for moderate pain (pain score 4-6) or severe pain (pain score 7-10). Take one tab po q 4 hrs prn pain 40 tablet 0   methocarbamol  (ROBAXIN ) 500 MG tablet Take 1 tablet (500 mg total) by mouth every 8 (eight) hours as needed for muscle spasms. 60 tablet 1   ondansetron  (ZOFRAN ) 4 MG tablet Take 1 tablet (4 mg total) by mouth every 8 (eight) hours as needed for nausea, vomiting or refractory nausea / vomiting. 30 tablet 1   traMADol  (ULTRAM ) 50 MG tablet Take 50 mg by mouth every 6 (six) hours as needed for severe pain (pain score 7-10).     No current facility-administered medications for this visit.    Allergies: Allergies  Allergen Reactions   Oxycodone  Other (See Comments)    "makes me deathly sick"    Past Medical History:  Diagnosis Date   Arthritis    Bacterial overgrowth syndrome    Positive HBT   Carotid artery occlusion    GERD (gastroesophageal reflux disease)    Heart murmur    mild AS 03/21/20 echo   Hypercholesteremia    Hypertension    Stroke (HCC)    hx TIA   TIA (transient ischemic attack)    Past Surgical History:  Procedure Laterality Date   ABDOMINAL HYSTERECTOMY     BACTERIAL OVERGROWTH TEST N/A 01/07/2013   Procedure: BACTERIAL OVERGROWTH TEST;  Surgeon: Suzette Espy, MD;  Location: [facility];  Service: Endoscopy;  Laterality: N/A;  7:30   Bladder Tack     x 2   CATARACT EXTRACTION W/ INTRAOCULAR LENS IMPLANT Bilateral    COLONOSCOPY  09/2005   Dr. Larrie Po: normal. Due for screening 2017   ENDARTERECTOMY Left 03/27/2020   Procedure: LEFT CAROTID ENDARTERECTOMY;  Surgeon: Richrd Char, MD;  Location: [facility] OR;  Service: Vascular;  Laterality: Left;   ESOPHAGOGASTRODUODENOSCOPY N/A 10/12/2012   RMR: Small inlet patch.  Small hiatal hernia.  Status post gastric biopsy, negative H.pylori   ESOPHAGOGASTRODUODENOSCOPY (EGD) WITH ESOPHAGEAL  DILATION  02/2010   Dr. Riley Cheadle: non-critical Schatzki's ring s/p dilation with 25 F dilator, small hiatal hernia, otherwise normal   FRACTURE SURGERY     left wrist   IR ANGIO EXTERNAL CAROTID SEL EXT CAROTID BILAT MOD SED  03/02/2019   IR ANGIO INTRA EXTRACRAN SEL INTERNAL CAROTID BILAT MOD SED  03/02/2019   IR ANGIO VERTEBRAL SEL VERTEBRAL BILAT MOD SED  03/02/2019   IR US  GUIDE VASC ACCESS RIGHT  03/02/2019   KNEE ARTHROSCOPY     Bilateral (torn meniscus)   ROTATOR CUFF REPAIR Left    TONSILLECTOMY     TOTAL KNEE ARTHROPLASTY Left 09/20/2022   Procedure: TOTAL KNEE ARTHROPLASTY;  Surgeon: Winston Hawking, MD;  Location: [facility];  Service: Orthopedics;  Laterality: Left;  general and spinal   TOTAL KNEE ARTHROPLASTY Right 05/30/2023   Procedure: ARTHROPLASTY, KNEE, TOTAL;  Surgeon: Winston Hawking, MD;  Location: [facility];  Service: Orthopedics;  Laterality: Right;  general, place in flip room please   TUBAL LIGATION     Family History  Problem Relation Age of Onset   Colon cancer Neg Hx    Social History   Socioeconomic History   Marital status: Divorced    Spouse name: Not on file   Number of children: Not on file   Years of education: Not on file   Highest education level: Not on file  Occupational History   Occupation: retired    Associate Professor: RETIRED    Comment: [employer]  Tobacco Use   Smoking status: Never   Smokeless tobacco: Never  Vaping Use   Vaping status: Never Used  Substance and Sexual Activity   Alcohol use: No   Drug use: No   Sexual activity: Not Currently  Other Topics Concern   Not on file  Social History Narrative   Not on file   Social Drivers of Health   Financial Resource Strain: Not on file  Food Insecurity: No Food Insecurity (09/24/2022)   Hunger Vital Sign    Worried About Running Out of Food in the Last Year: Never true    Ran Out of Food in the Last Year: Never true  Transportation Needs: No Transportation Needs (09/24/2022)    PRAPARE - Administrator, Civil Service (Medical): No    Lack of Transportation (Non-Medical): No  Physical Activity: Not on file  Stress: Not on file  Social Connections: Not on file  Intimate Partner Violence: Not At Risk (09/24/2022)   Humiliation, Afraid, Rape, and Kick questionnaire    Fear of Current or Ex-Partner: No    Emotionally Abused: No    Physically Abused: No    Sexually Abused: No    Review of Systems Constitutional: Patient denies any unintentional weight loss or change in strength lntegumentary: Patient denies any rashes or pruritus Cardiovascular: Patient denies chest pain or syncope  Respiratory: Patient denies shortness of breath Gastrointestinal:Patient denies constipation Musculoskeletal: Patient denies muscle cramps or weakness Neurologic: Patient denies convulsions or seizures Allergic/Immunologic: Patient denies recent allergic reaction(s) Hematologic/Lymphatic: Patient denies bleeding tendencies Endocrine: Patient denies heat/cold intolerance  GU: As per HPI.  OBJECTIVE Vitals:   07/01/23 1510  BP: (!) 142/79  Pulse: 83   There is no height or weight on file to calculate BMI.  Physical Examination Constitutional: No obvious distress; patient is non-toxic appearing  Cardiovascular: No visible lower extremity edema.  Respiratory: The patient does not have audible wheezing/stridor; respirations do not appear labored  Gastrointestinal: Abdomen non-distended Musculoskeletal: Normal ROM of UEs  Skin: No obvious rashes/open sores  Neurologic: CN 2-12 grossly intact Psychiatric: Answered questions appropriately with normal affect  Hematologic/Lymphatic/Immunologic: No obvious bruises or sites of spontaneous bleeding  UA: negative   ASSESSMENT OAB (overactive bladder) - Plan: Urinalysis, Routine w reflex microscopic, BLADDER SCAN AMB NON-IMAGING  Urge incontinence of urine - Plan: Urinalysis, Routine w reflex microscopic, BLADDER SCAN AMB  NON-IMAGING  Urinary frequency - Plan: Urinalysis, Routine w reflex microscopic, BLADDER SCAN AMB NON-IMAGING  Nocturia - Plan: Urinalysis, Routine w reflex microscopic, BLADDER SCAN AMB NON-IMAGING  OAB symptoms significantly improved with PTNS x12. We agreed to proceed with monthly PTNS beginning today. Advised to avoid caffeine use also. On 4th monthly visit will f/u with NP also for recheck. Pt verbalized understanding and agreement. All questions were answered.  PLAN Advised the following: 1. Minimize / avoid caffeine intake. 2. Return in about 3 months (around 09/30/2023) for monthly nurse visit for PTNS.  Orders Placed This Encounter  Procedures   Urinalysis, Routine w reflex microscopic   BLADDER SCAN AMB NON-IMAGING    It has been explained that the patient is to follow regularly with their PCP in addition to all other providers involved in their care and to follow instructions provided by these respective offices. Patient advised to contact urology clinic if any urologic-pertaining questions, concerns, new symptoms or problems arise in the interim period.  There are no Patient Instructions on file for this visit.  Electronically signed by:  Lauretta Ponto, FNP   07/01/23    4:16 PM

## 2023-07-01 NOTE — Patient Instructions (Signed)

## 2023-07-01 NOTE — Progress Notes (Signed)
 PTNS  Session # 13 of 45  PTNS was prescribed for the patient's Overactive Bladder symptoms. The voiding diary and/or patients symptoms were reviewed prior to the start of treatment.   Patient Goals: Reduced urgency  Health & Social Factors:  Caffeine:  2-3 times a day Alcohol: no Daytime voids #per day: 7-10 Night-time voids #per night: 2-3 Urgency: yes mild Incontinence Episodes #per day: 2 night Treatment Plan/Comments: toilet every 4 hours  Ankle used: left Treatment Setting: 6 Feeling/ Response: positive sensation  PTNS Treatment The needle electrode was inserted into the lower, inner aspect of patient's leg. The surface electrode was placed on the inside arch of the foot on the treatment leg.The lead set was connected to the stimulator, and the needle electrode clip was connected to the needle electrode. The stimulator that produces an adjustable electrical pulse that travels to the sacral nerve plexus via the tibial nerve was increased until a patient response was observed.    Performed By: Gorden Latino, CMA  Follow Up: monthly PTNS

## 2023-07-02 LAB — URINALYSIS, ROUTINE W REFLEX MICROSCOPIC
Bilirubin, UA: NEGATIVE
Glucose, UA: NEGATIVE
Ketones, UA: NEGATIVE
Nitrite, UA: NEGATIVE
Protein,UA: NEGATIVE
RBC, UA: NEGATIVE
Specific Gravity, UA: 1.025 (ref 1.005–1.030)
Urobilinogen, Ur: 0.2 mg/dL (ref 0.2–1.0)
pH, UA: 5.5 (ref 5.0–7.5)

## 2023-07-02 LAB — MICROSCOPIC EXAMINATION: Bacteria, UA: NONE SEEN

## 2023-07-24 ENCOUNTER — Ambulatory Visit (INDEPENDENT_AMBULATORY_CARE_PROVIDER_SITE_OTHER)

## 2023-07-24 DIAGNOSIS — N3281 Overactive bladder: Secondary | ICD-10-CM

## 2023-07-24 NOTE — Progress Notes (Addendum)
 PTNS  Session # 14 of 45  PTNS was prescribed for the patient's Overactive Bladder symptoms. The voiding diary and/or patients symptoms were reviewed prior to the start of treatment.   Patient Goals: Reduce Urgency  Health & Social Factors:  no change Caffeine: 2 caffeine a day Alcohol: no Daytime voids #per day: 8-10 Night-time voids #per night: 2 Urgency: yes Mild Incontinence Episodes #per day: 2 Treatment Plan/Comments: Reduce caffeine  Ankle used: left Treatment Setting: 3 Feeling/ Response: positive sensation  PTNS Treatment The needle electrode was inserted into the lower, inner aspect of patient's leg. The surface electrode was placed on the inside arch of the foot on the treatment leg.The lead set was connected to the stimulator, and the needle electrode clip was connected to the needle electrode. The stimulator that produces an adjustable electrical pulse that travels to the sacral nerve plexus via the tibial nerve was increased until a patient response was observed.    Performed By: Gorden Latino, CMA  Follow Up: Keep Monthly PTNS nurse visited

## 2023-07-24 NOTE — Patient Instructions (Signed)

## 2023-08-25 ENCOUNTER — Ambulatory Visit (INDEPENDENT_AMBULATORY_CARE_PROVIDER_SITE_OTHER)

## 2023-08-25 DIAGNOSIS — N3281 Overactive bladder: Secondary | ICD-10-CM

## 2023-08-25 NOTE — Progress Notes (Addendum)
 PTNS  Session # 15  of 45  PTNS was prescribed for the patient's Overactive Bladder symptoms. The voiding diary and/or patients symptoms were reviewed prior to the start of treatment.   Patient Goals: Reduce Urgency  Health & Social Factors:  No Chnage Caffeine: caffeinated soft drinks 1 /day Alcohol: no Daytime voids #per day: 8-10 Night-time voids #per night: 2 Urgency: yes Mild Incontinence Episodes #per day: 2 Treatment Plan/Comments: Reduce Caffeine  Ankle used: left Treatment Setting:6  (Went down to 5) Feeling/ Response: positive sensation Throbbing  PTNS Treatment The needle electrode was inserted into the lower, inner aspect of patient's leg. The surface electrode was placed on the inside arch of the foot on the treatment leg.The lead set was connected to the stimulator, and the needle electrode clip was connected to the needle electrode. The stimulator that produces an adjustable electrical pulse that travels to the sacral nerve plexus via the tibial nerve was increased until a patient response was observed.    Performed By: Gillian Lacrosse, CMA/ Freya.Fus, CMA  Follow Up: Keep monthly PTNS and office visited

## 2023-09-25 ENCOUNTER — Ambulatory Visit

## 2023-09-30 ENCOUNTER — Encounter: Payer: Self-pay | Admitting: Urology

## 2023-09-30 ENCOUNTER — Ambulatory Visit: Admitting: Urology

## 2023-09-30 VITALS — BP 176/79 | HR 79

## 2023-09-30 DIAGNOSIS — R351 Nocturia: Secondary | ICD-10-CM | POA: Diagnosis not present

## 2023-09-30 DIAGNOSIS — N3281 Overactive bladder: Secondary | ICD-10-CM

## 2023-09-30 DIAGNOSIS — N3941 Urge incontinence: Secondary | ICD-10-CM | POA: Diagnosis not present

## 2023-09-30 DIAGNOSIS — R350 Frequency of micturition: Secondary | ICD-10-CM | POA: Diagnosis not present

## 2023-09-30 LAB — URINALYSIS, ROUTINE W REFLEX MICROSCOPIC
Bilirubin, UA: NEGATIVE
Glucose, UA: NEGATIVE
Ketones, UA: NEGATIVE
Leukocytes,UA: NEGATIVE
Nitrite, UA: NEGATIVE
Protein,UA: NEGATIVE
RBC, UA: NEGATIVE
Specific Gravity, UA: 1.015 (ref 1.005–1.030)
Urobilinogen, Ur: 0.2 mg/dL (ref 0.2–1.0)
pH, UA: 5.5 (ref 5.0–7.5)

## 2023-09-30 NOTE — Progress Notes (Signed)
 PTNS  Session # 16 of 45  PTNS was prescribed for the patient's Overactive Bladder symptoms. The voiding diary and/or patients symptoms were reviewed prior to the start of treatment.   Patient Goals: reduced urgency  Health & Social Factors: no change Caffeine: 1 a day Alcohol: no Daytime voids #per day: 8-10 Night-time voids #per night: 2 Urgency: yes mild Incontinence Episodes #per day: 2 Treatment Plan/Comments:   Ankle used: left Treatment Setting: 2 Feeling/ Response: positive sensation  PTNS Treatment The needle electrode was inserted into the lower, inner aspect of patient's leg. The surface electrode was placed on the inside arch of the foot on the treatment leg.The lead set was connected to the stimulator, and the needle electrode clip was connected to the needle electrode. The stimulator that produces an adjustable electrical pulse that travels to the sacral nerve plexus via the tibial nerve was increased until a patient response was observed.    Performed By: Carlos, CMA  Follow Up: Monthly

## 2023-09-30 NOTE — Patient Instructions (Signed)
 Overactive bladder (OAB) overview for patients:  Symptoms may include: urinary urgency ("gotta go" feeling) urinary frequency (voiding >8 times per day) night time urination (nocturia) urge incontinence of urine (UUI)  While we do not know the exact etiology of OAB, several treatment options exist including:  Behavioral therapy: Reducing fluid intake Decreasing bladder stimulants (such as caffeine) and irritants (such as acidic food, spicy foods, alcohol) Urge suppression strategies Bladder retraining via timed voiding  Pelvic floor physical therapy  Medication(s) - can use one or both of the drug classes below. Anticholinergic / antimuscarinic medications:  Mechanism of action: Activate M3 receptors to reduce detrusor stimulation and increase bladder capacity  (parasympathetic nervous system). Effect: Relaxes the bladder to decrease overactivity, increase bladder storage capacity, and increase time between voids. Onset: Slow acting (may take 8-12 weeks to determine efficacy). Medications include: Vesicare (Solifenacin), Ditropan (Oxybutynin), Detrol (Tolterodine), Toviaz (Fesoterodine), Sanctura (Trospium), Urispas (Flavoxate), Enablex (Darifenacin), Bentyl (Dicyclomine), Levsin (Hyoscyamine ). Potential side effects include but are not limited to: Dry eyes, dry mouth, constipation, cognitive impairment, dementia risk with long term use, and urinary retention/ incomplete bladder emptying. Insurance companies generally prefer for patients to try 1-2 anticholinergic / antimuscarinic medications first due to low cost. Some exceptions are made based on patient-specific comorbidities / risk factors. Beta-3 agonist medications: Mechanism of action: Stimulates selective B3 adrenergic receptors to cause smooth muscle bladder relaxation (sympathetic nervous system). Effect: Relaxes the bladder to decrease overactivity, increase bladder storage capacity, and increase time  between voids. Onset: Slow acting (may take 8-12 weeks to determine efficacy). Medications include: Myrbetriq (Mirabegron) and Vibegron [name]). Potential side effects include but are not limited to: urinary retention / incomplete bladder emptying and elevated blood pressure (more likely to occur in individuals with pre-existing uncontrolled hypertension). These medications tend to be more expensive than the anticholinergic / antimuscarinic medications.   For patients with refractory OAB (if the above treatment options have been unsuccessful): Posterior tibial nerve stimulation (PTNS). Small acupuncture-type needle inserted near ankle with electric current to stimulate bladder via posterior tibial nerve pathway. Initially requires 12 weekly in-office treatments lasting 30 minutes each; followed by monthly in-office treatments lasting 30 minutes each for 1 year.  Bladder Botox injections. How it is done: Typically done via in-office cystoscopy; sometimes done in the OR depending on the situation. The bladder is numbed with lidocaine instilled via a catheter. Then the urologist injects Botox into the bladder muscle wall in about 20 locations. Causes local paralysis of the bladder muscle at the injection sites to reduce bladder muscle overactivity / spasms. The effect lasts for approximately 6 months and cannot be reversed once performed. Risks may included but are not limited to: infection, incomplete bladder emptying/ urinary retention, short term need for self-catheterization or indwelling catheter, and need for repeat therapy. There is a 5-12% chance of needing to catheterize with Botox - that usually resolves in a few months as the Botox wears off. Typically Botox injections would need to be repeated every 3-12 months since this is not a permanent therapy.  Sacral neuromodulation trial (Medtronic lnterStim or Axonics implant). Sacral neuromodulation is FDA-approved for uncontrolled urinary  urgency, urinary frequency, urinary urge incontinence, non-obstructive urinary retention, or fecal incontinence. It is not FDA-approved as a treatment for pain. The goal of this therapy is at least a 50% improvement in symptoms. It is NOT realistic to expect a 100% cure. This is a a 2-step outpatient procedure. After a successful  test period, a permanent wire and generator are placed in the OR. We discussed the risk of infection. We reviewed the fact that about 30% of patients fail the test phase and are not candidates for permanent generator placement. During the 1-2 week trial phase, symptoms are documented by the patient to determine response. If patient gets at least a 50% improvement in symptoms, they may then proceed with Step 2. Step 1: Trial lead placement. Per physician discretion, may done one of two ways: Percutaneous nerve evaluation (PNE) in the [facility] urology office. Performed by urologist under local anesthesia (numbing the area with lidocaine) using a spinal needle for placement of test wire, which usually stays in place for 5-7 days to determine therapy response. Test lead placement in OR under anesthesia. Usually stays in place 2 weeks to determine therapy response. > Step 2: Permanent implantation of sacral neuromodulation device, which is performed in the OR.  Sacral neuromodulation implants: All are conditionally MRI safe. Manufacturer: Medtronic Website: [email or web address]-for-you.[email or web address] Options: lnterStim X: Non-rechargeable. The battery lasts 10 years on average. lnterStim Micro: Rechargeable. The battery lasts 15 years on average and must be charged routinely. Approximately 50% smaller implant than lnterStim X implant.  Manufacturer: Axonics Website: [employer]axonics[employer]com Options: Non-rechargeable (Axonics F15): The battery lasts 15 years on average. Rechargeable (Axonics R20):  The battery lasts 20 years on average and must be charged in office for about 1 hour every 6-10 months on average. Approximately 50% smaller implant than Axonics non-rechargeable implant.  Note: Generally the rechargeable devices are only advised for very small or thin patients who may not have sufficient adipose tissue to comfortably overlay the implanted device.  Suprapubic catheter (SP tube) placement. Only done in severely refractory OAB when all other options have failed or are not a viable treatment choice depending on patient factors. Involves placement of a catheter through the lower abdomen into the bladder to continuously drain the bladder into an external collection bag, which patient can then empty at their convenience every few hours. Done via an outpatient surgical procedure in the OR under anesthesia. Risks may included but are not limited to: surgical site pain, infections, skin irritation / breakdown, chronic bacteriuria, symptomatic UTls. The SP tube must stay in place continuously. This is a reversible procedure however - the insertion site will close if catheter is removed for more than a few hours. The SP tube must be exchanged routinely every 4 weeks to prevent the catheter from becoming clogged with sediment. SP tube exchanges are typically performed at a urology nurse visit or by a home health nurse.

## 2023-09-30 NOTE — Progress Notes (Signed)
 Name: [name] DOB: Aug 17, 1945 MRN: [id number]  History of Present Illness: [name] is a 78 y.o. female who presents today for follow up visit at [facility].  Relevant History includes: 1. OAB with urinary frequency, nocturia, urgency, and urge incontinence. - Failed Sanctura  (Trospium ), Myrbetriq  (Mirabegron ), and Gemtesa  (Vibegron ).  - PTNS helpful.  At last visit on 07/01/2023: The plan was: Monthly PTNS.  Today: She reports that the monthly PTNS has not been as effective in controlling her OAB symptoms as when she was doing that treatment weekly. She reports bothersome urinary frequency, nocturia x2, urgency, and urge incontinence. Leaking a few times per day and overnight; going through 2-4 pads on average every 24 hours. She denies significant caffeine intake. She denies dysuria, gross hematuria, straining to void, or sensations of incomplete emptying.  Medications: Current Outpatient Medications  Medication Sig Dispense Refill   acetaminophen  (TYLENOL ) 500 MG tablet Take 500 mg by mouth every 6 (six) hours as needed for moderate pain (pain score 4-6).     amLODipine  (NORVASC ) 5 MG tablet Take 5 mg by mouth daily.  1   aspirin  EC 81 MG tablet Take 1 tablet (81 mg total) by mouth 2 (two) times daily. Swallow whole. 60 tablet 0   atorvastatin  (LIPITOR) 40 MG tablet Take 1 tablet (40 mg total) by mouth at bedtime. 30 tablet 1   B Complex-C (B-COMPLEX WITH VITAMIN C) tablet Take 1 tablet by mouth daily.     diclofenac  Sodium (VOLTAREN ) 1 % GEL Apply 2 g topically daily as needed (leg pain).     diphenhydramine -acetaminophen  (TYLENOL  PM) 25-500 MG TABS tablet Take 1 tablet by mouth at bedtime.     esomeprazole (NEXIUM) 20 MG capsule Take 20 mg by mouth daily as needed (acid reflux/indigestion.).     ezetimibe  (ZETIA ) 10 MG tablet Take 10 mg by mouth daily.     methocarbamol  (ROBAXIN ) 500 MG tablet Take 1 tablet (500 mg total) by mouth every 8 (eight) hours as  needed for muscle spasms. 60 tablet 1   gabapentin  (NEURONTIN ) 100 MG capsule Take 100 mg by mouth daily.     HYDROcodone -acetaminophen  (NORCO/VICODIN) 5-325 MG tablet Take 1-2 tablets by mouth every 6 (six) hours as needed for moderate pain (pain score 4-6) or severe pain (pain score 7-10). Take one tab po q 4 hrs prn pain 40 tablet 0   ondansetron  (ZOFRAN ) 4 MG tablet Take 1 tablet (4 mg total) by mouth every 8 (eight) hours as needed for nausea, vomiting or refractory nausea / vomiting. 30 tablet 1   traMADol  (ULTRAM ) 50 MG tablet Take 50 mg by mouth every 6 (six) hours as needed for severe pain (pain score 7-10).     No current facility-administered medications for this visit.    Allergies: Allergies  Allergen Reactions   Oxycodone  Other (See Comments)    makes me deathly sick    Past Medical History:  Diagnosis Date   Arthritis    Bacterial overgrowth syndrome    Positive HBT   Carotid artery occlusion    GERD (gastroesophageal reflux disease)    Heart murmur    mild AS 03/21/20 echo   Hypercholesteremia    Hypertension    Stroke (HCC)    hx TIA   TIA (transient ischemic attack)    Past Surgical History:  Procedure Laterality Date   ABDOMINAL HYSTERECTOMY     BACTERIAL OVERGROWTH TEST N/A 01/07/2013   Procedure: BACTERIAL OVERGROWTH TEST;  Surgeon:  Lamar CHRISTELLA Hollingshead, MD;  Location: [facility];  Service: Endoscopy;  Laterality: N/A;  7:30   Bladder Tack     x 2   CATARACT EXTRACTION W/ INTRAOCULAR LENS IMPLANT Bilateral    COLONOSCOPY  09/2005   Dr. Mavis: normal. Due for screening 2017   ENDARTERECTOMY Left 03/27/2020   Procedure: LEFT CAROTID ENDARTERECTOMY;  Surgeon: Harvey Carlin BRAVO, MD;  Location: [facility] OR;  Service: Vascular;  Laterality: Left;   ESOPHAGOGASTRODUODENOSCOPY N/A 10/12/2012   RMR: Small inlet patch.  Small hiatal hernia.  Status post gastric biopsy, negative H.pylori   ESOPHAGOGASTRODUODENOSCOPY (EGD) WITH ESOPHAGEAL DILATION  02/2010   Dr.  Hollingshead: non-critical Schatzki's ring s/p dilation with 65 F dilator, small hiatal hernia, otherwise normal   FRACTURE SURGERY     left wrist   IR ANGIO EXTERNAL CAROTID SEL EXT CAROTID BILAT MOD SED  03/02/2019   IR ANGIO INTRA EXTRACRAN SEL INTERNAL CAROTID BILAT MOD SED  03/02/2019   IR ANGIO VERTEBRAL SEL VERTEBRAL BILAT MOD SED  03/02/2019   IR US  GUIDE VASC ACCESS RIGHT  03/02/2019   KNEE ARTHROSCOPY     Bilateral (torn meniscus)   ROTATOR CUFF REPAIR Left    TONSILLECTOMY     TOTAL KNEE ARTHROPLASTY Left 09/20/2022   Procedure: TOTAL KNEE ARTHROPLASTY;  Surgeon: Kay Kemps, MD;  Location: [facility] ORS;  Service: Orthopedics;  Laterality: Left;  general and spinal   TOTAL KNEE ARTHROPLASTY Right 05/30/2023   Procedure: ARTHROPLASTY, KNEE, TOTAL;  Surgeon: Kay Kemps, MD;  Location: [facility] ORS;  Service: Orthopedics;  Laterality: Right;  general, place in flip room please   TUBAL LIGATION     Family History  Problem Relation Age of Onset   Colon cancer Neg Hx    Social History   Socioeconomic History   Marital status: Divorced    Spouse name: Not on file   Number of children: Not on file   Years of education: Not on file   Highest education level: Not on file  Occupational History   Occupation: retired    Associate Professor: RETIRED    Comment: [employer]  Tobacco Use   Smoking status: Never   Smokeless tobacco: Never  Vaping Use   Vaping status: Never Used  Substance and Sexual Activity   Alcohol use: No   Drug use: No   Sexual activity: Not Currently  Other Topics Concern   Not on file  Social History Narrative   Not on file   Social Drivers of Health   Financial Resource Strain: Not on file  Food Insecurity: No Food Insecurity (09/24/2022)   Hunger Vital Sign    Worried About Running Out of Food in the Last Year: Never true    Ran Out of Food in the Last Year: Never true  Transportation Needs: No Transportation Needs (09/24/2022)   PRAPARE - Therapist, art (Medical): No    Lack of Transportation (Non-Medical): No  Physical Activity: Not on file  Stress: Not on file  Social Connections: Not on file  Intimate Partner Violence: Not At Risk (09/24/2022)   Humiliation, Afraid, Rape, and Kick questionnaire    Fear of Current or Ex-Partner: No    Emotionally Abused: No    Physically Abused: No    Sexually Abused: No    Review of Systems Constitutional: Patient denies any unintentional weight loss or change in strength lntegumentary: Patient denies any rashes or pruritus Cardiovascular: Patient denies chest pain or syncope Respiratory:  Patient denies shortness of breath Gastrointestinal: Patient denies constipation Musculoskeletal: Patient denies muscle cramps or weakness Neurologic: Patient denies convulsions or seizures Allergic/Immunologic: Patient denies recent allergic reaction(s) Hematologic/Lymphatic: Patient denies bleeding tendencies Endocrine: Patient denies heat/cold intolerance  GU: As per HPI.  OBJECTIVE Vitals:   09/30/23 1410  BP: (!) 176/79  Pulse: 79   There is no height or weight on file to calculate BMI.  Physical Examination Constitutional: No obvious distress; patient is non-toxic appearing  Cardiovascular: No visible lower extremity edema.  Respiratory: The patient does not have audible wheezing/stridor; respirations do not appear labored  Gastrointestinal: Abdomen non-distended Musculoskeletal: Normal ROM of UEs  Skin: No obvious rashes/open sores  Neurologic: CN 2-12 grossly intact Psychiatric: Answered questions appropriately with normal affect  Hematologic/Lymphatic/Immunologic: No obvious bruises or sites of spontaneous bleeding  UA: negative   ASSESSMENT OAB (overactive bladder) - Plan: Urinalysis, Routine w reflex microscopic, PTNS-Percutaneous Tibial Nerve Stimulati  Urinary frequency - Plan: PTNS-Percutaneous Tibial Nerve Stimulati  Urge incontinence of urine - Plan:  PTNS-Percutaneous Tibial Nerve Stimulati  Nocturia - Plan: PTNS-Percutaneous Tibial Nerve Stimulati  We discussed the symptoms of overactive bladder (OAB), which include urinary urgency, frequency, nocturia, with or without urge incontinence.  - Failed behavioral therapy.  - Failed medications.  Failed monthly PTNS.   For refractory cases we discussed the following options in detail: 1. Bladder Botox injections: Reviewed the risks, benefits and alternatives of treatment including but not limited to infection, need for self catheterization and need for repeat therapy. We discussed that there is a 5-12% chance of needing to catheterize with Botox and that this usually resolves in a few months; however can persist for longer periods of time. Typically Botox injections would need to be repeated every 3-12 months since this is not a permanent therapy.   2. Sacral neuromodulation: The patient was informed that sacral neuromodulation is FDA-approved for uncontrolled urinary urgency, urinary frequency, urinary urge incontinence, non-obstructive urinary retention, or fecal incontinence. - We reviewed that sacral neuromodulation is not FDA-approved as a treatment for pain but that anecdotally some patients report improvement in chronic pelvic pain with use of sacral neuromodulation. - We discussed how it works. It requires a test phase, and documentation of bladder function via diary. After a successful test period, a permanent wire and generator are placed in the OR.   - Patient was advised that the goal of this therapy is at least a 50% improvement in symptoms. It is NOT realistic to expect a 100% cure. - We discussed options for trial lead placement including percutaneous nerve evaluation (PNE) in the office versus test lead placement in OR under anesthesia.  - We discussed the implant options for sacral neuromodulation. All are conditionally MRI safe. Pt was shown the demo kits for both the Medtronic  lnterStim X and the Axonics F15.  She decided to proceed with Medtronic InterStim X with office PNE. Will schedule that in the near future. Patient verbalized understanding and agreement. All questions were answered.  PLAN Advised the following: 1. Return in about 4 weeks (around 10/28/2023) for percutaneous nerve evaluation (PNE) with NP.  Orders Placed This Encounter  Procedures   PTNS-Percutaneous Tibial Nerve Stimulati    Procedure Location:   [facility] [place]   Urinalysis, Routine w reflex microscopic   Total time spent caring for the patient today was over 40 minutes. This includes time spent on the date of the visit reviewing the patient's chart before the visit, time spent during the visit,  and time spent after the visit on documentation. Over 50% of that time was spent in face-to-face time with this patient for direct counseling. E&M based on time and complexity of medical decision making.  It has been explained that the patient is to follow regularly with their PCP in addition to all other providers involved in their care and to follow instructions provided by these respective offices. Patient advised to contact urology clinic if any urologic-pertaining questions, concerns, new symptoms or problems arise in the interim period.  Patient Instructions     Overactive bladder (OAB) overview for patients:  Symptoms may include: urinary urgency (gotta go feeling) urinary frequency (voiding >8 times per day) night time urination (nocturia) urge incontinence of urine (UUI)  While we do not know the exact etiology of OAB, several treatment options exist including:  Behavioral therapy: Reducing fluid intake Decreasing bladder stimulants (such as caffeine) and irritants (such as acidic food, spicy foods, alcohol) Urge suppression strategies Bladder retraining via timed voiding  Pelvic floor physical therapy  Medication(s) - can use one or both of the drug classes  below. Anticholinergic / antimuscarinic medications:  Mechanism of action: Activate M3 receptors to reduce detrusor stimulation and increase bladder capacity   (parasympathetic nervous system). Effect: Relaxes the bladder to decrease overactivity, increase bladder storage capacity, and increase time between voids. Onset: Slow acting (may take 8-12 weeks to determine efficacy). Medications include: Vesicare (Solifenacin), Ditropan (Oxybutynin), Detrol (Tolterodine), Toviaz (Fesoterodine), Sanctura  (Trospium ), Urispas (Flavoxate), Enablex (Darifenacin), Bentyl (Dicyclomine), Levsin (Hyoscyamine ). Potential side effects include but are not limited to: Dry eyes, dry mouth, constipation, cognitive impairment, dementia risk with long term use, and urinary retention/ incomplete bladder emptying. Insurance companies generally prefer for patients to try 1-2 anticholinergic / antimuscarinic medications first due to low cost. Some exceptions are made based on patient-specific comorbidities / risk factors. Beta-3 agonist medications: Mechanism of action: Stimulates selective B3 adrenergic receptors to cause smooth muscle bladder relaxation (sympathetic nervous system). Effect: Relaxes the bladder to decrease overactivity, increase bladder storage capacity, and increase time between voids. Onset: Slow acting (may take 8-12 weeks to determine efficacy). Medications include: Myrbetriq  (Mirabegron ) and Vibegron  (Gemtesa ). Potential side effects include but are not limited to: urinary retention / incomplete bladder emptying and elevated blood pressure (more likely to occur in individuals with pre-existing uncontrolled hypertension). These medications tend to be more expensive than the anticholinergic / antimuscarinic medications.   For patients with refractory OAB (if the above treatment options have been unsuccessful): Posterior tibial nerve stimulation (PTNS). Small acupuncture-type needle inserted near ankle  with electric current to stimulate bladder via posterior tibial nerve pathway. Initially requires 12 weekly in-office treatments lasting 30 minutes each; followed by monthly in-office treatments lasting 30 minutes each for 1 year.  Bladder Botox injections. How it is done: Typically done via in-office cystoscopy; sometimes done in the OR depending on the situation. The bladder is numbed with lidocaine  instilled via a catheter. Then the urologist injects Botox into the bladder muscle wall in about 20 locations. Causes local paralysis of the bladder muscle at the injection sites to reduce bladder muscle overactivity / spasms. The effect lasts for approximately 6 months and cannot be reversed once performed. Risks may included but are not limited to: infection, incomplete bladder emptying/ urinary retention, short term need for self-catheterization or indwelling catheter, and need for repeat therapy. There is a 5-12% chance of needing to catheterize with Botox - that usually resolves in a few months as the Botox wears  off. Typically Botox injections would need to be repeated every 3-12 months since this is not a permanent therapy.  Sacral neuromodulation trial (Medtronic lnterStim or Axonics implant). Sacral neuromodulation is FDA-approved for uncontrolled urinary urgency, urinary frequency, urinary urge incontinence, non-obstructive urinary retention, or fecal incontinence. It is not FDA-approved as a treatment for pain. The goal of this therapy is at least a 50% improvement in symptoms. It is NOT realistic to expect a 100% cure. This is a a 2-step outpatient procedure. After a successful test period, a permanent wire and generator are placed in the OR. We discussed the risk of infection. We reviewed the fact that about 30% of patients fail the test phase and are not candidates for permanent generator placement. During the 1-2 week trial phase, symptoms are documented by the patient to determine response. If  patient gets at least a 50% improvement in symptoms, they may then proceed with Step 2. Step 1: Trial lead placement. Per physician discretion, may done one of two ways: Percutaneous nerve evaluation (PNE) in the [facility] urology office. Performed by urologist under local anesthesia (numbing the area with lidocaine ) using a spinal needle for placement of test wire, which usually stays in place for 5-7 days to determine therapy response. Test lead placement in OR under anesthesia. Usually stays in place 2 weeks to determine therapy response. > Step 2: Permanent implantation of sacral neuromodulation device, which is performed in the OR.  Sacral neuromodulation implants: All are conditionally MRI safe. Manufacturer: Medtronic Website: [employer] therapy/right-for-you.html Options: lnterStim X: Non-rechargeable. The battery lasts 10 years on average. lnterStim Micro: Rechargeable. The battery lasts 15 years on average and must be charged routinely. Approximately 50% smaller implant than lnterStim X implant.  Manufacturer: Axonics Website: Findrealrelief.axonics.com Options: Non-rechargeable (Axonics F15): The battery lasts 15 years on average. Rechargeable (Axonics R20): The battery lasts 20 years on average and must be charged in office for about 1 hour every 6-10 months on average. Approximately 50% smaller implant than Axonics non-rechargeable implant.  Note: Generally the rechargeable devices are only advised for very small or thin patients who may not have sufficient adipose tissue to comfortably overlay the implanted device.  Suprapubic catheter (SP tube) placement. Only done in severely refractory OAB when all other options have failed or are not a viable treatment choice depending on patient factors. Involves placement of a catheter through the lower abdomen into the bladder to continuously drain the  bladder into an external collection bag, which patient can then empty at their convenience every few hours. Done via an outpatient surgical procedure in the OR under anesthesia. Risks may included but are not limited to: surgical site pain, infections, skin irritation / breakdown, chronic bacteriuria, symptomatic UTls. The SP tube must stay in place continuously. This is a reversible procedure however - the insertion site will close if catheter is removed for more than a few hours. The SP tube must be exchanged routinely every 4 weeks to prevent the catheter from becoming clogged with sediment. SP tube exchanges are typically performed at a urology nurse visit or by a home health nurse.   Electronically signed by:  Lauraine JAYSON Oz, FNP   09/30/23    2:43 PM

## 2023-10-15 ENCOUNTER — Telehealth: Payer: Self-pay

## 2023-10-15 NOTE — Telephone Encounter (Signed)
 Office is not currently available for startup of PNE. [name] NP notified and made aware. Patient called and made aware we would place on wait list.  Patient would like to continue with PTNS treatments as of now and hold off on PNE.

## 2023-10-15 NOTE — Telephone Encounter (Signed)
-----   Message from Lauraine JAYSON Oz sent at 10/01/2023  2:11 PM EDT ----- Thank you. Please update patient - may need to be rescheduled. ----- Message ----- From: Jama Alan POUR, RN Sent: 10/01/2023  11:52 AM EDT To: Belvie LITTIE Clara, MD; Lauraine JAYSON Oz, FNP  The Epic ambulatory team has to create this type of visit/charges/encounters before we can go live.  They have multiple projects they are involved in currently. A ticket has been created. It will fall into their workque for them to create.   Then we have to have an account created to set up for supply ordering and so on.  It may be longer than a four week turnaround time to achieve what is needed since this is being created from scratch.  I will keep you posted. ----- Message ----- From: Oz Lauraine JAYSON, FNP Sent: 09/30/2023   2:56 PM EDT To: Belvie LITTIE Clara, MD; Alan POUR Jama, RN  This patient has refractory OAB with urinary urgency, frequency, nocturia, and urge incontinence. Has failed behavioral therapy, medications, and maintenance PTNS monthly (after initial success with weekly PTNS x12). She has elected to proceed with Medtronic InterStim X with office percutaneous nerve evaluation (PNE) with me.   Alan -  I asked front desk to schedule that in about 4 weeks (around 10/28/2023).  Is that a reasonable time frame for us ?  If so will you please coordinate that with Corean Sar (Medtronic rep)?  Dr. Clara -  Once we have the office PNE scheduled, do you prefer that I: A) Wait to submit the request for the Stage 2 procedure until after the office f/u appt for post-PNE symptom check & test lead removal? ~or~ B) Submit the surgery request for the Stage 2 procedure prior to the PNE so that she doesn't have to wait long after the office f/u appt for post-PNE symptom check & test lead removal?

## 2023-10-29 ENCOUNTER — Ambulatory Visit

## 2023-10-29 DIAGNOSIS — N3281 Overactive bladder: Secondary | ICD-10-CM

## 2023-10-29 NOTE — Progress Notes (Signed)
 PTNS  Session # 17 of 45  PTNS was prescribed for the patient's Overactive Bladder symptoms. The voiding diary and/or patients symptoms were reviewed prior to the start of treatment.   Patient Goals: Reduced Urgency  Health & Social Factors:  No Change Caffeine: 1 Alcohol: no Daytime voids #per day: 8-10 Night-time voids #per night: 2 Urgency: yes MILD Incontinence Episodes #per day: 2 Treatment Plan/Comments: Toilet every 2 hours  Ankle used: left Treatment Setting: 10 Feeling/ Response: positive sensation  PTNS Treatment The needle electrode was inserted into the lower, inner aspect of patient's leg. The surface electrode was placed on the inside arch of the foot on the treatment leg.The lead set was connected to the stimulator, and the needle electrode clip was connected to the needle electrode. The stimulator that produces an adjustable electrical pulse that travels to the sacral nerve plexus via the tibial nerve was increased until a patient response was observed.    Performed By: Carlos, CMA  Follow Up: Keep Monthly PTNS

## 2023-10-29 NOTE — Patient Instructions (Signed)

## 2023-10-30 ENCOUNTER — Ambulatory Visit: Admitting: Urology

## 2023-12-01 ENCOUNTER — Ambulatory Visit

## 2023-12-01 DIAGNOSIS — N3281 Overactive bladder: Secondary | ICD-10-CM | POA: Diagnosis not present

## 2023-12-01 DIAGNOSIS — R32 Unspecified urinary incontinence: Secondary | ICD-10-CM

## 2023-12-01 LAB — URINALYSIS, ROUTINE W REFLEX MICROSCOPIC
Bilirubin, UA: NEGATIVE
Glucose, UA: NEGATIVE
Ketones, UA: NEGATIVE
Nitrite, UA: POSITIVE — AB
Specific Gravity, UA: 1.025 (ref 1.005–1.030)
Urobilinogen, Ur: 0.2 mg/dL (ref 0.2–1.0)
pH, UA: 6 (ref 5.0–7.5)

## 2023-12-01 LAB — MICROSCOPIC EXAMINATION
RBC, Urine: >30 /HPF — AB (ref 0–2)
WBC, UA: >30 /HPF — AB (ref 0–5)

## 2023-12-01 MED ORDER — NITROFURANTOIN MONOHYD MACRO 100 MG PO CAPS: 100.0000 mg | ORAL_CAPSULE | Freq: Two times a day (BID) | ORAL | 0 refills | Status: AC

## 2023-12-01 NOTE — Progress Notes (Signed)
 PTNS  Session # 18 of 45  PTNS was prescribed for the patient's Overactive Bladder symptoms. The voiding diary and/or patients symptoms were reviewed prior to the start of treatment.   Patient Goals: No accidents  Health & Social Factors: no change Caffeine: 1 Alcohol: no Daytime voids #per day: 9-12 Night-time voids #per night: 2-3 Urgency: yes Strong Incontinence Episodes #per day: now 15 Treatment Plan/Comments: Reduced caffeine  Ankle used: left Treatment Setting: 4 Feeling/ Response: positive sensation  PTNS Treatment The needle electrode was inserted into the lower, inner aspect of patient's leg. The surface electrode was placed on the inside arch of the foot on the treatment leg.The lead set was connected to the stimulator, and the needle electrode clip was connected to the needle electrode. The stimulator that produces an adjustable electrical pulse that travels to the sacral nerve plexus via the tibial nerve was increased until a patient response was observed.    Performed By: Carlos, CMA  Follow Up: Keep weekly PTNS

## 2023-12-01 NOTE — Patient Instructions (Signed)

## 2023-12-04 LAB — URINE CULTURE

## 2023-12-05 ENCOUNTER — Ambulatory Visit: Payer: Self-pay

## 2023-12-08 ENCOUNTER — Ambulatory Visit

## 2023-12-10 ENCOUNTER — Ambulatory Visit

## 2023-12-10 NOTE — Patient Instructions (Signed)

## 2023-12-15 ENCOUNTER — Other Ambulatory Visit: Payer: Self-pay | Admitting: *Deleted

## 2023-12-15 DIAGNOSIS — I350 Nonrheumatic aortic (valve) stenosis: Secondary | ICD-10-CM

## 2023-12-16 ENCOUNTER — Ambulatory Visit (HOSPITAL_COMMUNITY)
Admission: RE | Admit: 2023-12-16 | Discharge: 2023-12-16 | Disposition: A | Discharge status: Home / Self Care | Payer: Medicare Other | Source: Ambulatory Visit | Attending: Student in an Organized Health Care Education/Training Program | Admitting: Student in an Organized Health Care Education/Training Program

## 2023-12-16 DIAGNOSIS — I350 Nonrheumatic aortic (valve) stenosis: Secondary | ICD-10-CM | POA: Diagnosis present

## 2023-12-16 LAB — ECHOCARDIOGRAM COMPLETE
AR max vel: 1.47 cm2
AV Area VTI: 1.42 cm2
AV Area mean vel: 1.41 cm2
AV Mean grad: 15 mmHg
AV Peak grad: 26.2 mmHg
Ao pk vel: 2.56 m/s
Area-P 1/2: 4.06 cm2
S' Lateral: 2.4 cm

## 2023-12-17 ENCOUNTER — Ambulatory Visit (INDEPENDENT_AMBULATORY_CARE_PROVIDER_SITE_OTHER)

## 2023-12-17 DIAGNOSIS — N3281 Overactive bladder: Secondary | ICD-10-CM | POA: Diagnosis not present

## 2023-12-17 NOTE — Patient Instructions (Signed)

## 2023-12-17 NOTE — Progress Notes (Signed)
 PTNS  Session # 19 of 45  PTNS was prescribed for the patient's Overactive Bladder symptoms. The voiding diary and/or patients symptoms were reviewed prior to the start of treatment.   Patient Goals: Reduced Urgency  Health & Social Factors: No change Caffeine: coffee 1 /day Alcohol: no Daytime voids #per day: 10+ Night-time voids #per night: 2 Urgency: yes mild Incontinence Episodes #per day: 2-3 Treatment Plan/Comments: Reduced caffeine  Ankle used: right Treatment Setting: 4 Feeling/ Response: positive sensation  PTNS Treatment The needle electrode was inserted into the lower, inner aspect of patient's leg. The surface electrode was placed on the inside arch of the foot on the treatment leg.The lead set was connected to the stimulator, and the needle electrode clip was connected to the needle electrode. The stimulator that produces an adjustable electrical pulse that travels to the sacral nerve plexus via the tibial nerve was increased until a patient response was observed.    Performed By: Carlos, CMA  Follow Up: Keep monthly PTNS

## 2024-01-07 ENCOUNTER — Ambulatory Visit

## 2024-01-19 ENCOUNTER — Ambulatory Visit

## 2024-01-19 DIAGNOSIS — N3281 Overactive bladder: Secondary | ICD-10-CM

## 2024-01-19 NOTE — Progress Notes (Signed)
 PTNS  Session Monthly PTNS  PTNS was prescribed for the patient's Overactive Bladder symptoms. The voiding diary and/or patients symptoms were reviewed prior to the start of treatment.   Patient Goals: Reduced Urgency  Health & Social Factors:  No Change Caffeine: 0 Alcohol: no Daytime voids #per day: 10-12 Night-time voids #per night: 2 Urgency: yes mild Incontinence Episodes #per day: 2 Treatment Plan/Comments: no fluid before bed  Ankle used: left Treatment Setting: 2 Feeling/ Response: positive sensation  PTNS Treatment The needle electrode was inserted into the lower, inner aspect of patient's leg. The surface electrode was placed on the inside arch of the foot on the treatment leg.The lead set was connected to the stimulator, and the needle electrode clip was connected to the needle electrode. The stimulator that produces an adjustable electrical pulse that travels to the sacral nerve plexus via the tibial nerve was increased until a patient response was observed.    Performed By: Carlos, CMA  Follow Up: Keep monthly PTNS appointment

## 2024-01-20 ENCOUNTER — Ambulatory Visit: Payer: Self-pay | Admitting: Internal Medicine

## 2024-01-20 ENCOUNTER — Other Ambulatory Visit (HOSPITAL_COMMUNITY): Payer: Self-pay | Admitting: Internal Medicine

## 2024-01-20 DIAGNOSIS — Z1231 Encounter for screening mammogram for malignant neoplasm of breast: Secondary | ICD-10-CM

## 2024-01-20 DIAGNOSIS — Q7959 Other congenital malformations of abdominal wall: Secondary | ICD-10-CM

## 2024-01-29 ENCOUNTER — Ambulatory Visit (HOSPITAL_COMMUNITY)

## 2024-01-30 ENCOUNTER — Encounter (HOSPITAL_COMMUNITY): Payer: Self-pay

## 2024-01-30 ENCOUNTER — Ambulatory Visit (HOSPITAL_COMMUNITY)
Admission: RE | Admit: 2024-01-30 | Discharge: 2024-01-30 | Disposition: A | Discharge status: Home / Self Care | Source: Ambulatory Visit | Attending: Internal Medicine | Admitting: Internal Medicine

## 2024-01-30 DIAGNOSIS — Z1231 Encounter for screening mammogram for malignant neoplasm of breast: Secondary | ICD-10-CM | POA: Diagnosis present

## 2024-02-04 ENCOUNTER — Ambulatory Visit (HOSPITAL_COMMUNITY): Admission: RE | Admit: 2024-02-04 | Source: Ambulatory Visit

## 2024-02-04 ENCOUNTER — Encounter (HOSPITAL_COMMUNITY): Payer: Self-pay

## 2024-02-19 ENCOUNTER — Other Ambulatory Visit (HOSPITAL_COMMUNITY): Payer: Self-pay | Admitting: Internal Medicine

## 2024-02-19 DIAGNOSIS — K439 Ventral hernia without obstruction or gangrene: Secondary | ICD-10-CM

## 2024-02-20 ENCOUNTER — Encounter (HOSPITAL_COMMUNITY): Payer: Self-pay

## 2024-02-20 ENCOUNTER — Ambulatory Visit (HOSPITAL_COMMUNITY)
Admission: RE | Admit: 2024-02-20 | Discharge: 2024-02-20 | Disposition: A | Discharge status: Home / Self Care | Source: Ambulatory Visit | Attending: Internal Medicine | Admitting: Internal Medicine

## 2024-02-20 DIAGNOSIS — K439 Ventral hernia without obstruction or gangrene: Secondary | ICD-10-CM

## 2024-02-23 ENCOUNTER — Ambulatory Visit

## 2024-02-23 DIAGNOSIS — N3281 Overactive bladder: Secondary | ICD-10-CM

## 2024-02-23 NOTE — Progress Notes (Signed)
 PTNS  Session # Monthly  PTNS was prescribed for the patient's Overactive Bladder symptoms. The voiding diary and/or patients symptoms were reviewed prior to the start of treatment.   Patient Goals: No Accidents  Health & Social Factors:  no change Caffeine: denies use Alcohol: no Daytime voids #per day: 10*12 Night-time voids #per night: 2 Urgency: yes mild Incontinence Episodes #per day: 2 Treatment Plan/Comments: no fluid before bed  Ankle used: right Treatment Setting: 3 Feeling/ Response: positive sensation  PTNS Treatment The needle electrode was inserted into the lower, inner aspect of patient's leg. The surface electrode was placed on the inside arch of the foot on the treatment leg.The lead set was connected to the stimulator, and the needle electrode clip was connected to the needle electrode. The stimulator that produces an adjustable electrical pulse that travels to the sacral nerve plexus via the tibial nerve was increased until a patient response was observed.    Performed By: Halford, CMA  Follow Up: Keep monthly PTNS

## 2024-03-17 ENCOUNTER — Ambulatory Visit (HOSPITAL_COMMUNITY)
Admission: RE | Admit: 2024-03-17 | Discharge: 2024-03-17 | Disposition: A | Discharge status: Home / Self Care | Source: Ambulatory Visit | Attending: Internal Medicine | Admitting: Internal Medicine

## 2024-03-17 DIAGNOSIS — K439 Ventral hernia without obstruction or gangrene: Secondary | ICD-10-CM | POA: Insufficient documentation

## 2024-03-17 MED ORDER — IOHEXOL 300 MG/ML  SOLN
100.0000 mL | Freq: Once | INTRAMUSCULAR | Status: AC | PRN
  Administered 2024-03-17: 100 mL via INTRAVENOUS

## 2024-03-29 ENCOUNTER — Ambulatory Visit

## 2024-03-29 DIAGNOSIS — N3281 Overactive bladder: Secondary | ICD-10-CM

## 2024-03-29 NOTE — Progress Notes (Signed)
 PTNS  Session # Monthly    PTNS was prescribed for the patient's Overactive Bladder symptoms. The voiding diary and/or patients symptoms were reviewed prior to the start of treatment.   Patient Goals: Reduced urgency  Health & Social Factors:  no change Caffeine: 0 Alcohol: no Daytime voids #per day: 10-12 Night-time voids #per night: 2 Urgency: yes mild Incontinence Episodes #per day: 0 Treatment Plan/Comments: no fluid before bed  Ankle used: right Treatment Setting: 5 Feeling/ Response: positive sensation  PTNS Treatment The needle electrode was inserted into the lower, inner aspect of patient's leg. The surface electrode was placed on the inside arch of the foot on the treatment leg.The lead set was connected to the stimulator, and the needle electrode clip was connected to the needle electrode. The stimulator that produces an adjustable electrical pulse that travels to the sacral nerve plexus via the tibial nerve was increased until a patient response was observed.    Performed By: Carlos, CMA  Follow Up: Keep Monthly PTNS

## 2024-04-02 ENCOUNTER — Ambulatory Visit: Payer: Self-pay | Admitting: Surgery

## 2024-04-02 NOTE — H&P (Signed)
 " Subjective    Chief Complaint: New Consultation (hernia)       History of Present Illness: [name] is a 79 y.o. female who is seen today as an office consultation at the request of Dr. Sheryle for evaluation of New Consultation (hernia) .     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  She has been working with physical therapy.  She felt a discomfort in her epigastrium several centimeters above her umbilicus.  Subsequently she developed a bulge in this area.  She mentioned this to Dr. Sheryle who obtained a CT scan.  This showed a small ventral hernia containing only some fat.  The fascial defect is 18 x 14 mm.  There is no bowel involvement.  The patient denies any obstructive symptoms.   Review of Systems: A complete review of systems was obtained from the patient.  I have reviewed this information and discussed as appropriate with the patient.  See HPI as well for other ROS.   Review of Systems  Constitutional: Negative.   HENT: Negative.    Eyes: Negative.   Respiratory: Negative.    Cardiovascular: Negative.   Gastrointestinal: Negative.   Genitourinary: Negative.   Musculoskeletal: Negative.   Skin: Negative.   Neurological: Negative.   Endo/Heme/Allergies: Negative.   Psychiatric/Behavioral: Negative.          Medical History: Past Medical History      Past Medical History:  Diagnosis Date   Arthritis     GERD (gastroesophageal reflux disease)     History of stroke     Hyperlipidemia     Hypertension          Problem List     Patient Active Problem List  Diagnosis   Ventral hernia without obstruction or gangrene   Abnormal liver function test   Essential hypertension   History of total knee replacement   Hyperglycemia   Left carotid artery stenosis   Mixed hyperlipidemia   OAB (overactive bladder)   TIA (transient ischemic attack)        Past Surgical History       Past Surgical  History:  Procedure Laterality Date    Total knee arthroplasty (Right)        Bladder Tack       CATARACT EXTRACTION       Endarterectomy (Left)       HYSTERECTOMY       Rotator cuff repair (Left)       Total knee arthroplasty (Left)        tubal ligation            Allergies       Allergies  Allergen Reactions   Oxycodone  Nausea and Other (See Comments)      makes me deathly sick        Medications Ordered Prior to Encounter        Current Outpatient Medications on File Prior to Visit  Medication Sig Dispense Refill   acetaminophen  (TYLENOL ) 500 MG tablet Take 500 mg by mouth every 6 (six) hours       amLODIPine  (NORVASC ) 5 MG tablet Take 5 mg by mouth once daily       aspirin  81 MG EC tablet Take 81 mg by mouth once daily       atorvastatin  (LIPITOR) 40 MG tablet Take 40 mg by mouth at bedtime       diclofenac  (VOLTAREN )  1 % topical gel Apply 2 g topically once daily as needed (leg pain)       esomeprazole (NEXIUM) 20 MG DR capsule Take 20 mg by mouth once daily as needed       ezetimibe  (ZETIA ) 10 mg tablet Take 10 mg by mouth once daily       gabapentin  (NEURONTIN ) 100 MG capsule Take 100 mg by mouth once daily       HYDROcodone -acetaminophen  (NORCO) 5-325 mg tablet Take 1-2 tablets by mouth every 6 (six) hours as needed for Pain       methocarbamol  (ROBAXIN ) 500 MG tablet Take 500 mg by mouth every 8 (eight) hours as needed       multivitamin with B complex-vitamin C (FARBEE WITH C) tablet Take 1 tablet by mouth once daily       ondansetron  (ZOFRAN ) 4 MG tablet Take 4 mg by mouth every 8 (eight) hours as needed for Nausea or Vomiting       traMADoL  (ULTRAM ) 50 mg tablet Take 50 mg by mouth every 6 (six) hours as needed for Pain        No current facility-administered medications on file prior to visit.        Family History       Family History  Problem Relation Age of Onset   High blood pressure (Hypertension) Mother     High blood pressure (Hypertension)  Father     Coronary Artery Disease (Blocked arteries around heart) Father     Skin cancer Sister     Obesity Sister     High blood pressure (Hypertension) Sister     Diabetes Sister          Tobacco Use History  Social History       Tobacco Use  Smoking Status Never  Smokeless Tobacco Never        Social History  Social History        Socioeconomic History   Marital status: Divorced  Tobacco Use   Smoking status: Never   Smokeless tobacco: Never  Vaping Use   Vaping status: Never Used  Substance and Sexual Activity   Drug use: Never    Social Drivers of Health        Food Insecurity: No Food Insecurity (09/24/2022)    Received from [facility]    Hunger Vital Sign     Within the past 12 months, you worried that your food would run out before you got the money to buy more.: Never true     Within the past 12 months, the food you bought just didn't last and you didn't have money to get more.: Never true  Transportation Needs: No Transportation Needs (09/24/2022)    Received from [facility] - [facility]     Lack of Transportation (Medical): No     Lack of Transportation (Non-Medical): No  Housing Stability: Unknown (04/02/2024)    Housing Stability Vital Sign     Homeless in the Last Year: No        Objective:         Vitals:    04/02/24 0933  BP: (!) 146/78  Pulse: 102  Temp: 36.6 C (97.8 F)  SpO2: 97%  Weight: 85.2 kg (187 lb 12.8 oz)  Height: 154.9 cm (5' 1)  PainSc: 0-No pain    Body mass index is 35.48 kg/m.   Physical Exam    Constitutional:  WDWN in NAD, conversant, no obvious  deformities; lying in bed comfortably Eyes:  Pupils equal, round; sclera anicteric; moist conjunctiva; no lid lag HENT:  Oral mucosa moist; good dentition  Neck:  No masses palpated, trachea midline; no thyromegaly Lungs:  CTA bilaterally; normal respiratory effort CV:  Regular rate and rhythm; no murmurs; extremities well-perfused with no  edema Abd:  +bowel sounds, soft, non-tender, no palpable organomegaly; approximately 4 cm above the umbilicus, there is a palpable mass in the subcutaneous tissues.  This measures approximately 4 cm in diameter.  This enlarges with Valsalva maneuver.  It is partially reducible with direct pressure.  No overlying skin changes. Musc: Normal gait; no apparent clubbing or cyanosis in extremities Lymphatic:  No palpable cervical or axillary lymphadenopathy Skin:  Warm, dry; no sign of jaundice Psychiatric - alert and oriented x 4; calm mood and affect     Labs, Imaging and Diagnostic Testing: EXAM: CT ABDOMEN AND PELVIS WITH CONTRAST 03/17/2024 03:09:34 PM   TECHNIQUE: CT of the abdomen and pelvis was performed with the administration of 100 mL of iohexol  (OMNIPAQUE ) 300 MG/ML solution. Multiplanar reformatted images are provided for review. Automated exposure control, iterative reconstruction, and/or weight-based adjustment of the mA/kV was utilized to reduce the radiation dose to as low as reasonably achievable.   COMPARISON: None available.   CLINICAL HISTORY: ventral hernia   FINDINGS:   LOWER CHEST: No acute abnormality.   LIVER: The liver is unremarkable.   GALLBLADDER AND BILE DUCTS: Gallbladder is unremarkable. No biliary ductal dilatation.   SPLEEN: No acute abnormality.   PANCREAS: No acute abnormality.   ADRENAL GLANDS: No acute abnormality.   KIDNEYS, URETERS AND BLADDER: No stones in the kidneys or ureters. No hydronephrosis. No perinephric or periureteral stranding. Urinary bladder is unremarkable.   GI AND BOWEL: The appendix is well visualized and within normal limits. No obstructive or inflammatory changes of the large or small bowel are seen. The stomach is within normal limits.   PERITONEUM AND RETROPERITONEUM: A ventral hernia is seen in the supraumbilical position containing omental fat. The hernia mouth measures 18 x 14 mm in the greatest  transverse and craniocaudad dimensions. No bowel was noted within the hernia. No ascites. No free air.   VASCULATURE: Aortic calcification is seen without aneurysmal dilatation. Left retroaortic renal vein is noted.   LYMPH NODES: No lymphadenopathy.   REPRODUCTIVE ORGANS: The uterus has been surgically removed.   BONES AND SOFT TISSUES: No bony abnormality is noted. No focal soft tissue abnormality.   IMPRESSION: 1. Ventral supraumbilical hernia containing omental fat with an 18 x 14 mm neck, without herniated bowel.   Electronically signed by: Oneil Devonshire MD 03/24/2024 11:55 PM EST RP Workstation: [id number]      Assessment and Plan:  Diagnoses and all orders for this visit:   Ventral hernia without obstruction or gangrene     Recommend open ventral hernia repair with mesh.The surgical procedure has been discussed with the patient.  Potential risks, benefits, alternative treatments, and expected outcomes have been explained.  All of the patient's questions at this time have been answered.  The likelihood of reaching the patient's treatment goal is good.  The patient understands the proposed surgical procedure and wishes to proceed.     [name] DEWAYNE LIMA, MD  04/02/2024 9:45 AM   "

## 2024-05-04 ENCOUNTER — Ambulatory Visit
